# Patient Record
Sex: Female | Born: 1965
Health system: Southern US, Community
[De-identification: ages and names within clinical notes are randomized; demographics above are authoritative.]

## PROBLEM LIST (undated history)

## (undated) DIAGNOSIS — R112 Nausea with vomiting, unspecified: Secondary | ICD-10-CM

## (undated) DIAGNOSIS — Z9889 Other specified postprocedural states: Secondary | ICD-10-CM

## (undated) DIAGNOSIS — F4010 Social phobia, unspecified: Secondary | ICD-10-CM

## (undated) DIAGNOSIS — Z973 Presence of spectacles and contact lenses: Secondary | ICD-10-CM

## (undated) DIAGNOSIS — E785 Hyperlipidemia, unspecified: Secondary | ICD-10-CM

## (undated) DIAGNOSIS — E739 Lactose intolerance, unspecified: Secondary | ICD-10-CM

## (undated) DIAGNOSIS — R768 Other specified abnormal immunological findings in serum: Secondary | ICD-10-CM

## (undated) DIAGNOSIS — D509 Iron deficiency anemia, unspecified: Secondary | ICD-10-CM

## (undated) DIAGNOSIS — M359 Systemic involvement of connective tissue, unspecified: Secondary | ICD-10-CM

## (undated) DIAGNOSIS — N840 Polyp of corpus uteri: Secondary | ICD-10-CM

## (undated) DIAGNOSIS — K589 Irritable bowel syndrome without diarrhea: Secondary | ICD-10-CM

## (undated) DIAGNOSIS — M2550 Pain in unspecified joint: Secondary | ICD-10-CM

## (undated) HISTORY — DX: Hyperlipidemia, unspecified: E78.5

## (undated) HISTORY — DX: Irritable bowel syndrome, unspecified: K58.9

---
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
## 2004-04-27 ENCOUNTER — Encounter: Admission: RE | Admit: 2004-04-27 | Discharge: 2004-04-27 | Payer: Self-pay | Admitting: Internal Medicine

## 2004-04-29 ENCOUNTER — Ambulatory Visit (HOSPITAL_COMMUNITY): Admission: RE | Admit: 2004-04-29 | Discharge: 2004-04-29 | Payer: Self-pay | Admitting: Internal Medicine

## 2004-06-01 ENCOUNTER — Ambulatory Visit (HOSPITAL_COMMUNITY): Admission: RE | Admit: 2004-06-01 | Discharge: 2004-06-01 | Payer: Self-pay | Admitting: Specialist

## 2004-06-17 ENCOUNTER — Ambulatory Visit (HOSPITAL_COMMUNITY): Admission: RE | Admit: 2004-06-17 | Discharge: 2004-06-17 | Payer: Self-pay | Admitting: Optometry

## 2004-08-02 ENCOUNTER — Encounter (INDEPENDENT_AMBULATORY_CARE_PROVIDER_SITE_OTHER): Payer: Self-pay | Admitting: Specialist

## 2004-08-02 ENCOUNTER — Inpatient Hospital Stay (HOSPITAL_COMMUNITY): Admission: RE | Admit: 2004-08-02 | Discharge: 2004-08-04 | Payer: Self-pay | Admitting: Otolaryngology

## 2004-08-02 HISTORY — PX: OTHER SURGICAL HISTORY: SHX169

## 2005-01-10 ENCOUNTER — Ambulatory Visit (HOSPITAL_COMMUNITY): Admission: RE | Admit: 2005-01-10 | Discharge: 2005-01-10 | Payer: Self-pay | Admitting: Internal Medicine

## 2005-07-17 ENCOUNTER — Ambulatory Visit (HOSPITAL_COMMUNITY): Admission: RE | Admit: 2005-07-17 | Discharge: 2005-07-17 | Payer: Self-pay | Admitting: Specialist

## 2005-10-02 ENCOUNTER — Ambulatory Visit (HOSPITAL_COMMUNITY): Admission: RE | Admit: 2005-10-02 | Discharge: 2005-10-02 | Payer: Self-pay | Admitting: Colon and Rectal Surgery

## 2006-04-04 ENCOUNTER — Ambulatory Visit: Payer: Self-pay | Admitting: Family Medicine

## 2006-04-09 ENCOUNTER — Ambulatory Visit: Payer: Self-pay | Admitting: Family Medicine

## 2006-04-23 ENCOUNTER — Ambulatory Visit: Payer: Self-pay | Admitting: Family Medicine

## 2006-04-30 ENCOUNTER — Ambulatory Visit: Payer: Self-pay | Admitting: Gastroenterology

## 2006-05-07 ENCOUNTER — Ambulatory Visit: Payer: Self-pay | Admitting: *Deleted

## 2006-05-28 ENCOUNTER — Ambulatory Visit: Payer: Self-pay | Admitting: Gastroenterology

## 2006-06-20 ENCOUNTER — Ambulatory Visit: Payer: Self-pay | Admitting: Internal Medicine

## 2006-07-09 ENCOUNTER — Ambulatory Visit (HOSPITAL_COMMUNITY): Admission: RE | Admit: 2006-07-09 | Discharge: 2006-07-09 | Payer: Self-pay | Admitting: Gastroenterology

## 2006-07-18 ENCOUNTER — Ambulatory Visit (HOSPITAL_COMMUNITY): Admission: RE | Admit: 2006-07-18 | Discharge: 2006-07-18 | Payer: Self-pay | Admitting: Specialist

## 2006-08-22 ENCOUNTER — Ambulatory Visit: Payer: Self-pay | Admitting: Family Medicine

## 2006-10-15 ENCOUNTER — Ambulatory Visit: Payer: Self-pay | Admitting: Family Medicine

## 2006-10-29 ENCOUNTER — Ambulatory Visit: Payer: Self-pay | Admitting: Family Medicine

## 2006-11-15 ENCOUNTER — Ambulatory Visit: Payer: Self-pay | Admitting: Family Medicine

## 2006-12-31 ENCOUNTER — Emergency Department (HOSPITAL_COMMUNITY): Admission: EM | Admit: 2006-12-31 | Discharge: 2006-12-31 | Payer: Self-pay | Admitting: Emergency Medicine

## 2007-02-11 ENCOUNTER — Ambulatory Visit: Payer: Self-pay | Admitting: Internal Medicine

## 2007-02-13 ENCOUNTER — Ambulatory Visit: Payer: Self-pay | Admitting: Family Medicine

## 2007-02-13 LAB — CONVERTED CEMR LAB
ALT: 10 units/L (ref 0–35)
AST: 12 units/L (ref 0–37)
Albumin: 4.6 g/dL (ref 3.5–5.2)
Alkaline Phosphatase: 39 units/L (ref 39–117)
BUN: 15 mg/dL (ref 6–23)
Bilirubin, Direct: 0.1 mg/dL (ref 0.0–0.3)
CO2: 25 meq/L (ref 19–32)
Calcium: 9.8 mg/dL (ref 8.4–10.5)
Chloride: 103 meq/L (ref 96–112)
Creatinine, Ser: 0.8 mg/dL (ref 0.40–1.20)
Glucose, Bld: 85 mg/dL (ref 70–99)
Helicobacter Pylori Antibody-IgG: 0.4
Indirect Bilirubin: 0.3 mg/dL (ref 0.0–0.9)
Potassium: 3.8 meq/L (ref 3.5–5.3)
Sodium: 141 meq/L (ref 135–145)
Total Bilirubin: 0.4 mg/dL (ref 0.3–1.2)
Total Protein: 7.1 g/dL (ref 6.0–8.3)

## 2007-02-15 ENCOUNTER — Ambulatory Visit: Payer: Self-pay | Admitting: Family Medicine

## 2007-02-26 ENCOUNTER — Ambulatory Visit (HOSPITAL_COMMUNITY): Admission: RE | Admit: 2007-02-26 | Discharge: 2007-02-26 | Payer: Self-pay | Admitting: Family Medicine

## 2007-04-11 DIAGNOSIS — IMO0001 Reserved for inherently not codable concepts without codable children: Secondary | ICD-10-CM

## 2007-04-11 DIAGNOSIS — K589 Irritable bowel syndrome without diarrhea: Secondary | ICD-10-CM

## 2007-04-15 ENCOUNTER — Ambulatory Visit: Payer: Self-pay | Admitting: Internal Medicine

## 2007-08-04 ENCOUNTER — Ambulatory Visit (HOSPITAL_COMMUNITY): Admission: RE | Admit: 2007-08-04 | Discharge: 2007-08-04 | Payer: Self-pay | Admitting: Specialist

## 2007-09-02 ENCOUNTER — Encounter (INDEPENDENT_AMBULATORY_CARE_PROVIDER_SITE_OTHER): Payer: Self-pay | Admitting: *Deleted

## 2007-09-09 ENCOUNTER — Ambulatory Visit: Payer: Self-pay | Admitting: Family Medicine

## 2007-09-09 DIAGNOSIS — R002 Palpitations: Secondary | ICD-10-CM

## 2007-09-09 DIAGNOSIS — Z862 Personal history of diseases of the blood and blood-forming organs and certain disorders involving the immune mechanism: Secondary | ICD-10-CM

## 2007-09-17 ENCOUNTER — Encounter: Payer: Self-pay | Admitting: Family Medicine

## 2007-09-17 ENCOUNTER — Ambulatory Visit: Payer: Self-pay

## 2007-09-18 ENCOUNTER — Ambulatory Visit: Payer: Self-pay | Admitting: Family Medicine

## 2007-09-25 LAB — CONVERTED CEMR LAB
ALT: 16 units/L (ref 0–35)
AST: 16 units/L (ref 0–37)
Albumin: 4.2 g/dL (ref 3.5–5.2)
Alkaline Phosphatase: 38 units/L — ABNORMAL LOW (ref 39–117)
BUN: 13 mg/dL (ref 6–23)
Basophils Absolute: 0 10*3/uL (ref 0.0–0.1)
Basophils Relative: 0.6 % (ref 0.0–1.0)
Bilirubin Urine: NEGATIVE
CO2: 27 meq/L (ref 19–32)
Calcium: 9 mg/dL (ref 8.4–10.5)
Chloride: 102 meq/L (ref 96–112)
Cholesterol: 185 mg/dL (ref 0–200)
Creatinine, Ser: 0.8 mg/dL (ref 0.4–1.2)
Eosinophils Absolute: 0 10*3/uL (ref 0.0–0.6)
Eosinophils Relative: 1.1 % (ref 0.0–5.0)
GFR calc Af Amer: 101 mL/min
GFR calc non Af Amer: 84 mL/min
Glucose, Bld: 97 mg/dL (ref 70–99)
HCT: 38 % (ref 36.0–46.0)
HDL: 57.2 mg/dL (ref >39.0)
Hemoglobin, Urine: NEGATIVE
Hemoglobin: 13.1 g/dL (ref 12.0–15.0)
Ketones, ur: NEGATIVE mg/dL
LDL Cholesterol: 122 mg/dL — ABNORMAL HIGH (ref 0–99)
Leukocytes, UA: NEGATIVE
Lymphocytes Relative: 29.5 % (ref 12.0–46.0)
MCHC: 34.4 g/dL (ref 30.0–36.0)
MCV: 84.5 fL (ref 78.0–100.0)
Monocytes Absolute: 0.4 10*3/uL (ref 0.2–0.7)
Monocytes Relative: 9.6 % (ref 3.0–11.0)
Neutro Abs: 2.3 10*3/uL (ref 1.4–7.7)
Neutrophils Relative %: 59.2 % (ref 43.0–77.0)
Nitrite: NEGATIVE
Platelets: 192 10*3/uL (ref 150–400)
Potassium: 4.1 meq/L (ref 3.5–5.1)
RBC: 4.5 M/uL (ref 3.87–5.11)
RDW: 12.5 % (ref 11.5–14.6)
Sodium: 135 meq/L (ref 135–145)
Specific Gravity, Urine: 1.02 (ref 1.000–1.03)
TSH: 2.21 microintl units/mL (ref 0.35–5.50)
Total Bilirubin: 0.8 mg/dL (ref 0.3–1.2)
Total CHOL/HDL Ratio: 3.2
Total Protein, Urine: NEGATIVE mg/dL
Total Protein: 7.2 g/dL (ref 6.0–8.3)
Triglycerides: 27 mg/dL (ref 0–149)
Urine Glucose: NEGATIVE mg/dL
Urobilinogen, UA: 0.2 (ref 0.0–1.0)
VLDL: 5 mg/dL (ref 0–40)
WBC: 3.9 10*3/uL — ABNORMAL LOW (ref 4.5–10.5)
pH: 6.5 (ref 5.0–8.0)

## 2007-09-26 LAB — CONVERTED CEMR LAB: Vit D, 1,25-Dihydroxy: 31 (ref 30–89)

## 2007-10-02 ENCOUNTER — Telehealth: Payer: Self-pay | Admitting: Family Medicine

## 2007-10-02 DIAGNOSIS — E785 Hyperlipidemia, unspecified: Secondary | ICD-10-CM

## 2007-11-03 ENCOUNTER — Encounter: Payer: Self-pay | Admitting: Family Medicine

## 2007-12-19 ENCOUNTER — Encounter: Payer: Self-pay | Admitting: Internal Medicine

## 2007-12-29 ENCOUNTER — Ambulatory Visit: Payer: Self-pay | Admitting: Family Medicine

## 2007-12-29 DIAGNOSIS — M810 Age-related osteoporosis without current pathological fracture: Secondary | ICD-10-CM | POA: Insufficient documentation

## 2007-12-30 LAB — CONVERTED CEMR LAB: Vit D, 1,25-Dihydroxy: 35 (ref 30–89)

## 2008-01-01 ENCOUNTER — Emergency Department (HOSPITAL_COMMUNITY): Admission: EM | Admit: 2008-01-01 | Discharge: 2008-01-01 | Payer: Self-pay | Admitting: Emergency Medicine

## 2008-01-08 ENCOUNTER — Encounter (INDEPENDENT_AMBULATORY_CARE_PROVIDER_SITE_OTHER): Payer: Self-pay | Admitting: Family Medicine

## 2008-01-15 ENCOUNTER — Encounter: Admission: RE | Admit: 2008-01-15 | Discharge: 2008-01-15 | Payer: Self-pay | Admitting: Family Medicine

## 2008-01-20 ENCOUNTER — Emergency Department (HOSPITAL_COMMUNITY): Admission: EM | Admit: 2008-01-20 | Discharge: 2008-01-20 | Payer: Self-pay | Admitting: Family Medicine

## 2008-03-26 ENCOUNTER — Ambulatory Visit: Payer: Self-pay | Admitting: Licensed Clinical Social Worker

## 2008-03-30 ENCOUNTER — Emergency Department (HOSPITAL_COMMUNITY): Admission: EM | Admit: 2008-03-30 | Discharge: 2008-03-30 | Payer: Self-pay | Admitting: Emergency Medicine

## 2008-04-28 ENCOUNTER — Encounter: Payer: Self-pay | Admitting: Family Medicine

## 2008-06-02 ENCOUNTER — Emergency Department (HOSPITAL_COMMUNITY): Admission: EM | Admit: 2008-06-02 | Discharge: 2008-06-02 | Payer: Self-pay | Admitting: Emergency Medicine

## 2008-06-23 ENCOUNTER — Ambulatory Visit: Payer: Self-pay | Admitting: Family Medicine

## 2008-06-23 DIAGNOSIS — F411 Generalized anxiety disorder: Secondary | ICD-10-CM | POA: Insufficient documentation

## 2008-09-03 ENCOUNTER — Ambulatory Visit (HOSPITAL_COMMUNITY): Admission: RE | Admit: 2008-09-03 | Discharge: 2008-09-03 | Payer: Self-pay | Admitting: Specialist

## 2008-10-28 ENCOUNTER — Ambulatory Visit: Payer: Self-pay | Admitting: Family Medicine

## 2008-10-28 ENCOUNTER — Ambulatory Visit: Payer: Self-pay

## 2008-10-28 DIAGNOSIS — M79609 Pain in unspecified limb: Secondary | ICD-10-CM

## 2008-11-03 ENCOUNTER — Telehealth (INDEPENDENT_AMBULATORY_CARE_PROVIDER_SITE_OTHER): Payer: Self-pay | Admitting: *Deleted

## 2008-11-09 ENCOUNTER — Encounter (INDEPENDENT_AMBULATORY_CARE_PROVIDER_SITE_OTHER): Payer: Self-pay | Admitting: *Deleted

## 2008-12-15 ENCOUNTER — Ambulatory Visit: Payer: Self-pay | Admitting: Family Medicine

## 2008-12-15 DIAGNOSIS — E7210 Disorders of sulfur-bearing amino-acid metabolism, unspecified: Secondary | ICD-10-CM | POA: Insufficient documentation

## 2008-12-16 ENCOUNTER — Ambulatory Visit: Payer: Self-pay | Admitting: Family Medicine

## 2008-12-19 LAB — CONVERTED CEMR LAB
Homocysteine: 7.2 micromoles/L (ref 4.0–15.4)
Vit D, 1,25-Dihydroxy: 32 (ref 30–89)

## 2008-12-20 ENCOUNTER — Telehealth (INDEPENDENT_AMBULATORY_CARE_PROVIDER_SITE_OTHER): Payer: Self-pay | Admitting: *Deleted

## 2008-12-21 ENCOUNTER — Encounter (INDEPENDENT_AMBULATORY_CARE_PROVIDER_SITE_OTHER): Payer: Self-pay | Admitting: *Deleted

## 2008-12-30 LAB — CONVERTED CEMR LAB
ALT: 14 units/L (ref 0–35)
AST: 14 units/L (ref 0–37)
Albumin: 4.3 g/dL (ref 3.5–5.2)
Alkaline Phosphatase: 35 units/L — ABNORMAL LOW (ref 39–117)
BUN: 9 mg/dL (ref 6–23)
Basophils Absolute: 0 10*3/uL (ref 0.0–0.1)
Basophils Relative: 0.4 % (ref 0.0–3.0)
Bilirubin, Direct: 0.1 mg/dL (ref 0.0–0.3)
CO2: 29 meq/L (ref 19–32)
Calcium: 9.5 mg/dL (ref 8.4–10.5)
Chloride: 105 meq/L (ref 96–112)
Cholesterol: 169 mg/dL (ref 0–200)
Creatinine, Ser: 0.8 mg/dL (ref 0.4–1.2)
Eosinophils Absolute: 0.1 10*3/uL (ref 0.0–0.7)
Eosinophils Relative: 1 % (ref 0.0–5.0)
Folate: >20 ng/mL
GFR calc Af Amer: 101 mL/min
GFR calc non Af Amer: 83 mL/min
Glucose, Bld: 87 mg/dL (ref 70–99)
HCT: 40.2 % (ref 36.0–46.0)
HDL: 52 mg/dL (ref >39.0)
Hemoglobin: 13.6 g/dL (ref 12.0–15.0)
LDL Cholesterol: 108 mg/dL — ABNORMAL HIGH (ref 0–99)
Lymphocytes Relative: 23.2 % (ref 12.0–46.0)
MCHC: 33.9 g/dL (ref 30.0–36.0)
MCV: 85.4 fL (ref 78.0–100.0)
Monocytes Absolute: 0.5 10*3/uL (ref 0.1–1.0)
Monocytes Relative: 9.9 % (ref 3.0–12.0)
Neutro Abs: 3.5 10*3/uL (ref 1.4–7.7)
Neutrophils Relative %: 65.5 % (ref 43.0–77.0)
Platelets: 177 10*3/uL (ref 150–400)
Potassium: 4.1 meq/L (ref 3.5–5.1)
RBC: 4.71 M/uL (ref 3.87–5.11)
RDW: 12.7 % (ref 11.5–14.6)
Sodium: 141 meq/L (ref 135–145)
TSH: 1.71 microintl units/mL (ref 0.35–5.50)
Total Bilirubin: 1.1 mg/dL (ref 0.3–1.2)
Total CHOL/HDL Ratio: 3.3
Total Protein: 7 g/dL (ref 6.0–8.3)
Triglycerides: 44 mg/dL (ref 0–149)
VLDL: 9 mg/dL (ref 0–40)
Vitamin B-12: 387 pg/mL (ref 211–911)
WBC: 5.4 10*3/uL (ref 4.5–10.5)

## 2009-01-03 ENCOUNTER — Ambulatory Visit: Payer: Self-pay | Admitting: Internal Medicine

## 2009-01-03 ENCOUNTER — Encounter (INDEPENDENT_AMBULATORY_CARE_PROVIDER_SITE_OTHER): Payer: Self-pay | Admitting: *Deleted

## 2009-01-05 ENCOUNTER — Ambulatory Visit: Payer: Self-pay | Admitting: Internal Medicine

## 2009-02-02 ENCOUNTER — Ambulatory Visit: Payer: Self-pay | Admitting: Family Medicine

## 2009-02-02 DIAGNOSIS — E559 Vitamin D deficiency, unspecified: Secondary | ICD-10-CM | POA: Insufficient documentation

## 2009-02-02 DIAGNOSIS — J020 Streptococcal pharyngitis: Secondary | ICD-10-CM | POA: Insufficient documentation

## 2009-02-04 ENCOUNTER — Encounter (INDEPENDENT_AMBULATORY_CARE_PROVIDER_SITE_OTHER): Payer: Self-pay | Admitting: *Deleted

## 2009-02-04 LAB — CONVERTED CEMR LAB: Vit D, 25-Hydroxy: 56 ng/mL (ref 30–89)

## 2009-02-18 ENCOUNTER — Ambulatory Visit: Payer: Self-pay | Admitting: Internal Medicine

## 2009-04-07 ENCOUNTER — Telehealth (INDEPENDENT_AMBULATORY_CARE_PROVIDER_SITE_OTHER): Payer: Self-pay | Admitting: *Deleted

## 2009-05-31 ENCOUNTER — Encounter: Payer: Self-pay | Admitting: Family Medicine

## 2009-06-01 ENCOUNTER — Encounter: Payer: Self-pay | Admitting: Family Medicine

## 2009-06-08 ENCOUNTER — Encounter (INDEPENDENT_AMBULATORY_CARE_PROVIDER_SITE_OTHER): Payer: Self-pay | Admitting: *Deleted

## 2009-06-09 ENCOUNTER — Encounter: Payer: Self-pay | Admitting: Family Medicine

## 2009-09-05 ENCOUNTER — Ambulatory Visit (HOSPITAL_COMMUNITY): Admission: RE | Admit: 2009-09-05 | Discharge: 2009-09-05 | Payer: Self-pay | Admitting: Obstetrics and Gynecology

## 2009-10-27 ENCOUNTER — Ambulatory Visit: Payer: Self-pay | Admitting: Family Medicine

## 2009-12-05 ENCOUNTER — Ambulatory Visit: Payer: Self-pay | Admitting: Genetic Counselor

## 2009-12-07 ENCOUNTER — Encounter: Payer: Self-pay | Admitting: Family Medicine

## 2010-01-13 ENCOUNTER — Ambulatory Visit: Payer: Self-pay | Admitting: Family Medicine

## 2010-01-17 ENCOUNTER — Encounter: Payer: Self-pay | Admitting: Family Medicine

## 2010-01-18 LAB — CONVERTED CEMR LAB: Vit D, 25-Hydroxy: 34 ng/mL (ref 30–89)

## 2010-05-11 ENCOUNTER — Emergency Department (HOSPITAL_COMMUNITY): Admission: EM | Admit: 2010-05-11 | Discharge: 2010-05-11 | Payer: Self-pay | Admitting: Family Medicine

## 2010-10-11 ENCOUNTER — Encounter: Payer: Self-pay | Admitting: Family Medicine

## 2010-10-11 LAB — CONVERTED CEMR LAB: Pap Smear: NORMAL

## 2010-10-30 ENCOUNTER — Ambulatory Visit (HOSPITAL_COMMUNITY): Admission: RE | Admit: 2010-10-30 | Discharge: 2010-10-30 | Payer: Self-pay | Admitting: Obstetrics and Gynecology

## 2010-11-24 ENCOUNTER — Ambulatory Visit (HOSPITAL_COMMUNITY)
Admission: RE | Admit: 2010-11-24 | Discharge: 2010-11-24 | Payer: Self-pay | Source: Home / Self Care | Attending: Obstetrics and Gynecology | Admitting: Obstetrics and Gynecology

## 2010-11-24 ENCOUNTER — Encounter: Payer: Self-pay | Admitting: Family Medicine

## 2010-12-06 ENCOUNTER — Encounter: Payer: Self-pay | Admitting: Family Medicine

## 2010-12-16 ENCOUNTER — Encounter (HOSPITAL_COMMUNITY): Payer: Self-pay | Admitting: Obstetrics and Gynecology

## 2010-12-16 ENCOUNTER — Encounter: Payer: Self-pay | Admitting: Otolaryngology

## 2010-12-16 ENCOUNTER — Encounter: Payer: Self-pay | Admitting: Rheumatology

## 2010-12-17 ENCOUNTER — Encounter: Payer: Self-pay | Admitting: Gastroenterology

## 2010-12-17 ENCOUNTER — Encounter (HOSPITAL_COMMUNITY): Payer: Self-pay | Admitting: Obstetrics and Gynecology

## 2010-12-17 ENCOUNTER — Encounter: Payer: Self-pay | Admitting: Internal Medicine

## 2010-12-24 LAB — CONVERTED CEMR LAB
ALT: 21 units/L (ref 0–35)
AST: 21 units/L (ref 0–37)
Basophils Relative: 0.5 % (ref 0.0–3.0)
Bilirubin, Direct: 0.1 mg/dL (ref 0.0–0.3)
Chloride: 107 meq/L (ref 96–112)
Eosinophils Relative: 1.1 % (ref 0.0–5.0)
HCT: 38 % (ref 36.0–46.0)
LDL Cholesterol: 129 mg/dL — ABNORMAL HIGH (ref 0–99)
Lymphs Abs: 1.1 10*3/uL (ref 0.7–4.0)
MCV: 87 fL (ref 78.0–100.0)
Monocytes Absolute: 0.3 10*3/uL (ref 0.1–1.0)
Pap Smear: NORMAL
Pap Smear: NORMAL
Potassium: 4.3 meq/L (ref 3.5–5.1)
RBC: 4.37 M/uL (ref 3.87–5.11)
Sodium: 142 meq/L (ref 135–145)
TSH: 2.18 microintl units/mL (ref 0.35–5.50)
Total CHOL/HDL Ratio: 3
Total Protein: 7.3 g/dL (ref 6.0–8.3)
WBC: 3.8 10*3/uL — ABNORMAL LOW (ref 4.5–10.5)

## 2010-12-26 NOTE — Letter (Signed)
Summary: [facility]  [facility]   Imported By: Lanelle Bal 12/19/2009 09:10:32  _____________________________________________________________________  External Attachment:    Type:   Image     Comment:   External Document

## 2010-12-26 NOTE — Assessment & Plan Note (Signed)
Summary: CPX/NS/KDC   Vital Signs:  Patient profile:   45 year old female Height:      67 inches Weight:      148 pounds BMI:     23.26 Temp:     98.2 degrees F oral Pulse rate:   82 / minute Pulse rhythm:   regular BP sitting:   116 / 72  (left arm) Cuff size:   regular  Vitals Entered By: Army Fossa CMA (January 13, 2010 8:35 AM) CC: CPX, no pap.    History of Present Illness: Pt here for cpe and labs.  Pt saw cardiology for palpatations and they found nothing ---see will f/u with them if it happens again.   Pt saw Dr Corliss Skains for f/u---she is off plaquenil and will f/u annually or as needed.  Pt c/o knee pain . She felt it was OA.    Preventive Screening-Counseling & Management  Alcohol-Tobacco     Alcohol drinks/day: 0     Smoking Status: never     Passive Smoke Exposure: no  Caffeine-Diet-Exercise     Caffeine use/day: 1     Does Patient Exercise: no  Hep-HIV-STD-Contraception     HIV Risk: no     Dental Visit-last 6 months yes     Dental Care Counseling: not indicated; dental care within six months  Safety-Violence-Falls     Seat Belt Use: 100      Sexual History:  currently monogamous.    Current Medications (verified): 1)  Mvi .Marland Kitchen.. 1 By Mouth Once Daily 2)  Vitamin D 65784 Unit Caps (Ergocalciferol) .... Take 1 Tab Weekly 3)  Xanax 0.25 Mg Tabs (Alprazolam) .Marland Kitchen.. 1-2 A Month On Average.  Allergies: 1)  ! Sulfa  Past History:  Past Medical History: Last updated: 02/16/2009 IBS fibromyalgia Anxiety tachycardia  Past Surgical History: Last updated: 12/15/2008 Appendectomy (1981) brachial cyst removed 2005 husband had vasectomy  Family History: Last updated: 01/13/2010 Family History Diabetes 1st degree relative Family History Hypertension Family History of Alcoholism/Addiction Family History High cholesterol Family History of Arthritis PGM-breast Ca Family History of Stroke M 1st degree relative in 70s muscular  dystropy---PGF Family History Thyroid disease Family History of Melanoma-- MGF Family History Breast cancer 1st degree relative 63 yo--mother  Social History: Last updated: 09/09/2007 Occupation:nurse  Married Never Smoked Alcohol use-no Drug use-no Regular exercise-no  Risk Factors: Alcohol Use: 0 (01/13/2010) Caffeine Use: 1 (01/13/2010) Exercise: no (01/13/2010)  Risk Factors: Smoking Status: never (01/13/2010) Passive Smoke Exposure: no (01/13/2010)  Family History: Reviewed history from 09/09/2007 and no changes required. Family History Diabetes 1st degree relative Family History Hypertension Family History of Alcoholism/Addiction Family History High cholesterol Family History of Arthritis PGM-breast Ca Family History of Stroke M 1st degree relative in 70s muscular dystropy---PGF Family History Thyroid disease Family History of Melanoma-- MGF Family History Breast cancer 1st degree relative 28 yo--mother  Social History: Reviewed history from 09/09/2007 and no changes required. 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 Eyes:  Denies blurring, discharge, double vision, eye irritation, eye pain, halos, itching, light sensitivity, red eye, vision loss-1 eye, and vision loss-both eyes; optho q1y. ENT:  Denies decreased hearing, difficulty swallowing, ear discharge, earache, hoarseness, nasal congestion, nosebleeds,  postnasal drainage, ringing in ears, sinus pressure, and sore throat. CV:  Complains of palpitations; denies bluish discoloration of lips or nails, chest pain or discomfort, difficulty breathing at night, difficulty breathing while lying down, fainting, fatigue, leg  cramps with exertion, lightheadness, near fainting, shortness of breath with exertion, swelling of feet, swelling of hands, and weight gain. Resp:  Denies chest discomfort, chest pain with inspiration, cough, coughing up blood, excessive snoring, hypersomnolence, morning headaches, pleuritic, shortness of breath, sputum productive, and wheezing. GI:  Denies abdominal pain, bloody stools, change in bowel habits, constipation, dark tarry stools, diarrhea, excessive appetite, gas, hemorrhoids, indigestion, loss of appetite, nausea, vomiting, vomiting blood, and yellowish skin color. GU:  Denies abnormal vaginal bleeding, decreased libido, discharge, dysuria, genital sores, hematuria, incontinence, nocturia, urinary frequency, and urinary hesitancy. MS:  Complains of joint pain; denies joint redness, joint swelling, loss of strength, low back pain, mid back pain, muscle aches, muscle , cramps, muscle weakness, stiffness, and thoracic pain. Derm:  Denies changes in color of skin, changes in nail beds, dryness, excessive perspiration, flushing, hair loss, insect bite(s), itching, lesion(s), poor wound healing, and rash; derm -- Dr Danella Deis. Neuro:  Denies brief paralysis, difficulty with concentration, disturbances in coordination, falling down, headaches, inability to speak, memory loss, numbness, poor balance, seizures, sensation of room spinning, tingling, tremors, visual disturbances, and weakness. Psych:  Denies alternate hallucination ( auditory/visual), anxiety, depression, easily angered, easily tearful, irritability, mental problems, panic attacks, sense of great danger, suicidal thoughts/plans, thoughts of violence, unusual visions or sounds, and thoughts /plans of harming others. Endo:  Denies cold intolerance, excessive hunger, excessive thirst, excessive urination, heat intolerance, polyuria, and weight change. Heme:  Denies abnormal bruising, bleeding, enlarge lymph nodes, fevers, pallor, and skin  discoloration.  Physical Exam  General:  Well-developed,well-nourished,in no acute distress; alert,appropriate and cooperative throughout examination Head:  Normocephalic and atraumatic without obvious abnormalities. No apparent alopecia or balding. Eyes:  vision grossly intact, pupils equal, pupils round, pupils reactive to light, pupils react to accomodation, and no injection.   Ears:  External ear exam shows no significant lesions or deformities.  Otoscopic examination reveals clear canals, tympanic membranes are intact bilaterally without bulging, retraction, inflammation or discharge. Hearing is grossly normal bilaterally. Nose:  External nasal examination shows no deformity or inflammation. Nasal mucosa are pink and moist without lesions or exudates. Mouth:  Oral mucosa and oropharynx without lesions or exudates.  Teeth in good repair. Neck:  No deformities, masses, or tenderness noted. Breasts:  gyn Lungs:  Normal respiratory effort, chest expands symmetrically. Lungs are clear to auscultation, no crackles or wheezes. Heart:  normal rate and no murmur.   Abdomen:  Bowel sounds positive,abdomen soft and non-tender without masses, organomegaly or hernias noted. Rectal:  gyn Genitalia:  gyn Msk:  normal ROM, no joint tenderness, no joint swelling, no joint warmth, no redness over joints, no joint deformities, no joint instability, and no crepitation.   Pulses:  R posterior tibial normal, R dorsalis pedis normal, R carotid normal, L posterior tibial normal, L dorsalis pedis normal, and L carotid normal.   Extremities:  No clubbing, cyanosis, edema, or deformity noted with normal full range of motion of all joints.   Neurologic:  No cranial nerve deficits noted. Station and gait are normal. Plantar reflexes are down-going bilaterally. DTRs are symmetrical throughout. Sensory, motor and coordinative functions appear intact. Skin:  Intact without suspicious lesions or rashes Cervical Nodes:  No  lymphadenopathy noted Axillary Nodes:  No palpable lymphadenopathy  Psych:  Cognition and judgment appear intact. Alert and cooperative with normal attention span and concentration. No apparent delusions, illusions, hallucinations   Impression & Recommendations:  Problem # 1:  PREVENTIVE HEALTH CARE (ICD-V70.0)  Orders: Venipuncture (04540) TLB-Lipid Panel (80061-LIPID) TLB-BMP (Basic Metabolic Panel-BMET) (80048-METABOL) TLB-CBC Platelet - w/Differential (85025-CBCD) TLB-Hepatic/Liver Function Pnl (80076-HEPATIC) TLB-TSH (Thyroid Stimulating Hormone) (84443-TSH) T-Vitamin D (25-Hydroxy) (98119-14782) EKG w/ Interpretation (93000)  Problem # 2:  UNSPECIFIED VITAMIN D DEFICIENCY (ICD-268.9)  Orders: Venipuncture (95621) TLB-Lipid Panel (80061-LIPID) TLB-BMP (Basic Metabolic Panel-BMET) (80048-METABOL) TLB-CBC Platelet - w/Differential (85025-CBCD) TLB-Hepatic/Liver Function Pnl (80076-HEPATIC) TLB-TSH (Thyroid Stimulating Hormone) (84443-TSH) T-Vitamin D (25-Hydroxy) (30865-78469)  Problem # 3:  UNSPECIFIED OSTEOPOROSIS (ICD-733.00)  Her updated medication list for this problem includes:    Vitamin D 62952 Unit Caps (Ergocalciferol) .Marland Kitchen... Take 1 tab weekly  Problem # 4:  HYPERLIPIDEMIA (ICD-272.4)  Orders: Venipuncture (84132) TLB-Lipid Panel (80061-LIPID) TLB-BMP (Basic Metabolic Panel-BMET) (80048-METABOL) TLB-CBC Platelet - w/Differential (85025-CBCD) TLB-Hepatic/Liver Function Pnl (80076-HEPATIC) TLB-TSH (Thyroid Stimulating Hormone) (84443-TSH) T-Vitamin D (25-Hydroxy) (44010-27253)  Complete Medication List: 1)  Mvi  .Marland Kitchen.. 1 by mouth once daily 2)  Vitamin D 66440 Unit Caps (Ergocalciferol) .... Take 1 tab weekly 3)  Xanax 0.25 Mg Tabs (Alprazolam) .Marland Kitchen.. 1-2 a month on average.   EKG  Procedure date:  01/13/2010  Findings:      Normal sinus rhythm with rate of:  79 bpm    Last Flu Vaccine:  Historical (11/26/2004 11:39:22 AM) Flu Vaccine Result Date:   09/15/2008 Flu Vaccine Result:  given Flu Vaccine Next Due:  1 yr Last PAP:  Normal (09/01/2008 9:10:05 AM) PAP Result Date:  10/05/2009 PAP Result:  normal PAP Next Due:  1 yr Last Mammogram:  Normal Bilateral (09/08/2008 9:09:35 AM) Mammogram Result Date:  10/05/2009 Mammogram Result:  normal Mammogram Next Due:  1 yr

## 2010-12-28 NOTE — Letter (Signed)
Summary: [facility]  [facility]   Imported By: Lanelle Bal 12/19/2010 12:35:56  _____________________________________________________________________  External Attachment:    Type:   Image     Comment:   External Document

## 2011-01-03 ENCOUNTER — Ambulatory Visit (INDEPENDENT_AMBULATORY_CARE_PROVIDER_SITE_OTHER): Payer: Commercial Managed Care - PPO | Admitting: Family Medicine

## 2011-01-03 ENCOUNTER — Encounter: Payer: Self-pay | Admitting: Family Medicine

## 2011-01-03 DIAGNOSIS — J029 Acute pharyngitis, unspecified: Secondary | ICD-10-CM | POA: Insufficient documentation

## 2011-01-05 ENCOUNTER — Telehealth: Payer: Self-pay | Admitting: Family Medicine

## 2011-01-11 NOTE — Progress Notes (Signed)
Summary: still no better  Phone Note Call from Patient Call back at Home Phone [phone]   Caller: Patient Summary of Call: Pt called to report that she is still no better. Pt c/o pain in left cheek and sore throat. Pt feels that it may be a sinus infection because her son was just Dx with one. Pt is requesting Rx. [facility] [address] Pls advise....Marland KitchenMarland KitchenFelecia Deloach CMA  January 05, 2011 12:47 PM   Follow-up for Phone Call        ceftin 500 mg 1 by mouth two times a day for 10 days  Follow-up by: Loreen Freud DO,  January 05, 2011 1:06 PM  Additional Follow-up for Phone Call Additional follow up Details #1::        I spoke w/ pt she is aware. Army Fossa CMA  January 05, 2011 2:02 PM     New/Updated Medications: CEFTIN 500 MG TABS (CEFUROXIME AXETIL) 1 by mouth two times a day for 10 days. Prescriptions: CEFTIN 500 MG TABS (CEFUROXIME AXETIL) 1 by mouth two times a day for 10 days.  #20 x 0   Entered by:   Army Fossa CMA   Authorized by:   Loreen Freud DO   Signed by:   Army Fossa CMA on 01/05/2011   Method used:   [facility]ronically to        [facility] [phone]* (retail)       [address]       [place], [place]  [address]       Ph: [phone]       Fax: [phone]   RxID:   [id number]

## 2011-01-11 NOTE — Assessment & Plan Note (Signed)
Summary: sore throat/cbs   Vital Signs:  Patient profile:   45 year old female Height:      67 inches Weight:      149.2 pounds BMI:     23.45 Temp:     98.1 degrees F oral BP sitting:   112 / 72  (right arm) Cuff size:   regular  Vitals Entered By: Almeta Monas CMA Duncan Dull) (January 03, 2011 11:28 AM) CC: c/o sore throat x 1day--daughter has strep, URI symptoms   History of Present Illness:       This is a 45 year old woman who presents with URI symptoms.  The symptoms began 2 days ago.  Pt here c/o ST ---her daugher was dx with strep.  The patient complains of sore throat, but denies nasal congestion, clear nasal discharge, purulent nasal discharge, dry cough, productive cough, earache, and sick contacts.  The patient denies fever, low-grade fever (<100.5 degrees), fever of 100.5-103 degrees, fever of 103.1-104 degrees, fever to >104 degrees, stiff neck, dyspnea, wheezing, rash, vomiting, diarrhea, use of an antipyretic, and response to antipyretic.  The patient denies itchy watery eyes, itchy throat, sneezing, seasonal symptoms, response to antihistamine, headache, muscle aches, and severe fatigue.  The patient denies the following risk factors for Strep sinusitis: unilateral facial pain, unilateral nasal discharge, poor response to decongestant, double sickening, tooth pain tender adenopathy, and absence of cough.    Current Medications (verified): 1)  Mvi .Marland Kitchen.. 1 By Mouth Once Daily 2)  Xanax 0.25 Mg Tabs (Alprazolam) .Marland Kitchen.. 1-2 A Month On Average.  Allergies (verified): 1)  ! Sulfa  Past History:  Past medical, surgical, family and social histories (including risk factors) reviewed for relevance to current acute and chronic problems.  Past Medical History: Reviewed history from 02/16/2009 and no changes required. IBS fibromyalgia Anxiety tachycardia  Past Surgical History: Reviewed history from 12/15/2008 and no changes required. Appendectomy (1981) brachial cyst removed  2005 husband had vasectomy  Family History: Reviewed history from 01/13/2010 and no changes required. Family History Diabetes 1st degree relative Family History Hypertension Family History of Alcoholism/Addiction Family History High cholesterol Family History of Arthritis PGM-breast Ca Family History of Stroke M 1st degree relative in 70s muscular dystropy---PGF Family History Thyroid disease Family History of Melanoma-- MGF Family History Breast cancer 1st degree relative 38 yo--mother  Social History: Reviewed history from 09/09/2007 and no changes required. 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  Otoscopic examination reveals clear canals, tympanic membranes are intact bilaterally without bulging, retraction, inflammation or discharge. Hearing is grossly normal bilaterally. Nose:  External nasal examination shows no deformity or inflammation. Nasal mucosa are pink and moist without lesions or exudates. Mouth:  no exudates and pharyngeal erythema.   Neck:  No deformities, masses, or tenderness noted. Lungs:  Normal respiratory effort, chest expands symmetrically. Lungs are clear to auscultation, no crackles or wheezes. Heart:  Normal rate and regular rhythm. S1 and S2 normal without gallop, murmur, click, rub or other extra sounds. Psych:  Oriented X3 and not anxious appearing.     Impression & Recommendations:  Problem # 1:  SORE THROAT (ICD-462)  Orders: Rapid Strep (78469)  Instructed to complete antibiotics and call if not improved in 48 hours.   Complete  Medication List: 1)  Mvi  .Marland Kitchen.. 1 by mouth once daily 2)  Xanax 0.25 Mg Tabs (Alprazolam) .Marland Kitchen.. 1-2 a month on  average.   Orders Added: 1)  Rapid Strep [63875] 2)  Est. Patient Level III [64332]    Laboratory Results    Other Tests  Rapid Strep: negative

## 2011-01-15 ENCOUNTER — Ambulatory Visit (INDEPENDENT_AMBULATORY_CARE_PROVIDER_SITE_OTHER): Payer: Commercial Managed Care - PPO | Admitting: Family Medicine

## 2011-01-15 ENCOUNTER — Encounter: Payer: Self-pay | Admitting: Family Medicine

## 2011-01-15 DIAGNOSIS — E785 Hyperlipidemia, unspecified: Secondary | ICD-10-CM

## 2011-01-15 DIAGNOSIS — Z0000 Encounter for general adult medical examination without abnormal findings: Secondary | ICD-10-CM

## 2011-01-15 DIAGNOSIS — R200 Anesthesia of skin: Secondary | ICD-10-CM

## 2011-01-15 DIAGNOSIS — R209 Unspecified disturbances of skin sensation: Secondary | ICD-10-CM

## 2011-01-15 DIAGNOSIS — E559 Vitamin D deficiency, unspecified: Secondary | ICD-10-CM

## 2011-01-15 LAB — CONVERTED CEMR LAB
Bilirubin Urine: NEGATIVE
Blood in Urine, dipstick: NEGATIVE
Glucose, Urine, Semiquant: NEGATIVE
Ketones, urine, test strip: NEGATIVE
Protein, U semiquant: NEGATIVE
pH: 7

## 2011-01-18 ENCOUNTER — Other Ambulatory Visit: Payer: Self-pay | Admitting: Family Medicine

## 2011-01-18 ENCOUNTER — Other Ambulatory Visit: Payer: Commercial Managed Care - PPO

## 2011-01-18 ENCOUNTER — Encounter: Payer: Self-pay | Admitting: Family Medicine

## 2011-01-18 ENCOUNTER — Encounter (INDEPENDENT_AMBULATORY_CARE_PROVIDER_SITE_OTHER): Payer: Self-pay | Admitting: *Deleted

## 2011-01-18 DIAGNOSIS — Z0000 Encounter for general adult medical examination without abnormal findings: Secondary | ICD-10-CM

## 2011-01-18 LAB — CBC WITH DIFFERENTIAL/PLATELET
Basophils Absolute: 0 10*3/uL (ref 0.0–0.1)
Basophils Relative: 0.5 % (ref 0.0–3.0)
Eosinophils Absolute: 0.1 10*3/uL (ref 0.0–0.7)
Eosinophils Relative: 1.3 % (ref 0.0–5.0)
HCT: 39.1 % (ref 36.0–46.0)
Hemoglobin: 13.3 g/dL (ref 12.0–15.0)
Lymphocytes Relative: 24.4 % (ref 12.0–46.0)
Lymphs Abs: 1.2 10*3/uL (ref 0.7–4.0)
MCHC: 33.9 g/dL (ref 30.0–36.0)
MCV: 85.9 fl (ref 78.0–100.0)
Monocytes Absolute: 0.4 10*3/uL (ref 0.1–1.0)
Monocytes Relative: 9 % (ref 3.0–12.0)
Neutro Abs: 3.1 10*3/uL (ref 1.4–7.7)
Neutrophils Relative %: 64.8 % (ref 43.0–77.0)
Platelets: 210 10*3/uL (ref 150.0–400.0)
RBC: 4.55 Mil/uL (ref 3.87–5.11)
RDW: 13.4 % (ref 11.5–14.6)
WBC: 4.8 10*3/uL (ref 4.5–10.5)

## 2011-01-18 LAB — TSH: TSH: 2.09 u[IU]/mL (ref 0.35–5.50)

## 2011-01-18 LAB — HEPATIC FUNCTION PANEL
ALT: 13 U/L (ref 0–35)
AST: 18 U/L (ref 0–37)
Albumin: 4.1 g/dL (ref 3.5–5.2)
Alkaline Phosphatase: 43 U/L (ref 39–117)
Bilirubin, Direct: 0.1 mg/dL (ref 0.0–0.3)
Total Bilirubin: 0.6 mg/dL (ref 0.3–1.2)
Total Protein: 6.8 g/dL (ref 6.0–8.3)

## 2011-01-18 LAB — BASIC METABOLIC PANEL
BUN: 15 mg/dL (ref 6–23)
CO2: 30 mEq/L (ref 19–32)
Calcium: 9.2 mg/dL (ref 8.4–10.5)
Chloride: 104 mEq/L (ref 96–112)
Creatinine, Ser: 0.7 mg/dL (ref 0.4–1.2)
GFR: 91.24 mL/min (ref >60.00)
Glucose, Bld: 69 mg/dL — ABNORMAL LOW (ref 70–99)
Potassium: 4.9 mEq/L (ref 3.5–5.1)
Sodium: 140 mEq/L (ref 135–145)

## 2011-01-23 NOTE — Assessment & Plan Note (Signed)
Summary: cpx/fasting/kn   Vital Signs:  Patient profile:   45 year old female Height:      67 inches Weight:      148.4 pounds Pulse rate:   78 / minute Pulse rhythm:   regular BP sitting:   108 / 69  (left arm) Cuff size:   regular  Vitals Entered By: Almeta Monas CMA [facility] Dull) (January 15, 2011 8:37 AM) CC: CPX/No pap--Not fasting   History of Present Illness: Pt here for cpe and labs.  Pt will be having MRI and mammogram q1y per gyn.  ANA is + again but Dr Corliss Skains is holding off on plaquenil for now because she has no symptoms.  No other complaints.    Preventive Screening-Counseling & Management  Alcohol-Tobacco     Alcohol drinks/day: 0     Smoking Status: never     Passive Smoke Exposure: no  Caffeine-Diet-Exercise     Caffeine use/day: 2     Does Patient Exercise: yes     Type of exercise: aerobics     Times/week: <3     Exercise Counseling: to improve exercise regimen  Hep-HIV-STD-Contraception     HIV Risk: no     Dental Visit-last 6 months yes     Dental Care Counseling: not indicated; dental care within six months     SBE monthly: yes     SBE Education/Counseling: not indicated; SBE done regularly  Safety-Violence-Falls     Seat Belt Use: 100      Sexual History:  currently monogamous.        Drug Use:  no.    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) 7)  Leg Pain, Left  (ICD-729.5) 8)  Anxiety  (ICD-300.00) 9)  Unspecified Osteoporosis  (ICD-733.00) 10)  Hyperlipidemia  (ICD-272.4) 11)  Preventive Health Care  (ICD-V70.0) 12)  Symptom, Palpitations  (ICD-785.1) 13)  Ana Positive, Hx of  (ICD-V12.3) 14)  Family History of Alcoholism/addiction  (ICD-V61.41) 15)  Injury, 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 1 By Mouth Once Daily 2)  Xanax 0.25 Mg Tabs (Alprazolam) .Marland Kitchen.. 1-2 A Month On Average. 3)  Ceftin 500 Mg Tabs (Cefuroxime Axetil) .Marland Kitchen.. 1 By Mouth Two Times A Day For 10 Days.  Current Medications (verified): 1)  Mvi .Marland Kitchen.. 1 By Mouth Once Daily 2)  Xanax 0.25 Mg Tabs (Alprazolam) .Marland Kitchen.. 1-2 A Month On Average. 3)  Retin-A 0.025 % Crea (Tretinoin) .... Apply At Bedtime  Allergies (verified): 1)  ! Sulfa  Past History:  Past Medical History: Last updated: 02/16/2009 IBS fibromyalgia Anxiety tachycardia  Past Surgical History: Last updated: 12/15/2008 Appendectomy (1981) brachial cyst removed 2005 husband had vasectomy  Family History: Last updated: 01/15/2011 Family History Diabetes 1st degree relative Family History Hypertension Family History of Alcoholism/Addiction Family History High cholesterol Family History of Arthritis PGM-breast Ca Family History of Stroke M 1st degree relative in 70s muscular dystropy---PGF Family History Thyroid disease  Family History of Melanoma-- MGF Family History Breast cancer 1st degree relative 16 yo--mother Family History Uterine cancer  Social History: Last updated: 09/09/2007 Occupation:nurse  Married Never Smoked Alcohol use-no Drug use-no Regular exercise-no  Risk Factors: Alcohol Use: 0 (01/15/2011) Caffeine Use: 2 (01/15/2011) Exercise: yes (01/15/2011)  Risk Factors: Smoking Status: never (01/15/2011) Passive Smoke Exposure: no (01/15/2011)  Family History: Reviewed history from 01/13/2010 and no changes required. Family History Diabetes 1st degree relative Family History  Hypertension Family History of Alcoholism/Addiction Family History High cholesterol Family History of Arthritis PGM-breast Ca Family History of Stroke M 1st degree relative in 70s muscular dystropy---PGF Family History Thyroid disease Family History of Melanoma-- MGF Family History Breast cancer 1st degree relative 23 yo--mother Family History Uterine cancer  Social History: Reviewed history from 09/09/2007 and no changes required. 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 Eyes:  Denies blurring, discharge, double vision, eye irritation, eye pain, halos, itching, light sensitivity, red eye, vision loss-1 eye, and vision loss-both eyes; optho-- q1y . ENT:  Denies decreased hearing, difficulty swallowing, ear discharge, earache, hoarseness, nasal congestion, nosebleeds, postnasal drainage, ringing in ears, sinus pressure, and sore throat. CV:  Denies bluish discoloration of lips or nails, chest pain or discomfort, difficulty breathing at night, difficulty breathing while lying down, fainting, fatigue, leg cramps with exertion, lightheadness, near fainting, palpitations, shortness of breath with exertion, swelling of feet, swelling of hands, and weight gain. Resp:  Denies chest discomfort, chest pain with inspiration, cough, coughing up blood, excessive snoring, hypersomnolence, morning headaches, pleuritic, shortness of breath, sputum productive, and wheezing. GI:  Denies abdominal pain, bloody stools, change in bowel habits, constipation, dark tarry stools, diarrhea, excessive appetite, gas, hemorrhoids, indigestion, loss of appetite, nausea, vomiting, vomiting blood, and yellowish skin color. GU:  Denies abnormal vaginal bleeding, decreased  libido, discharge, dysuria, genital sores, hematuria, incontinence, nocturia, urinary frequency, and urinary hesitancy. MS:  Denies joint pain, joint redness, joint swelling, loss of strength, low back pain, mid back pain, muscle aches, muscle , cramps, muscle weakness, stiffness, and thoracic pain. Derm:  Denies changes in color of skin, changes in nail beds, dryness, excessive perspiration, flushing, hair loss, insect bite(s), itching, lesion(s), poor wound healing, and rash; derm-- Dr Danella Deis. Neuro:  Denies brief paralysis, difficulty with concentration, disturbances in coordination, falling down, headaches, inability to speak, memory loss, numbness, poor balance, seizures, sensation of room spinning, tingling, tremors, visual disturbances, and weakness. Psych:  Denies alternate hallucination ( auditory/visual), anxiety, depression, easily angered, easily tearful, irritability, mental problems, panic attacks, sense of great danger, suicidal thoughts/plans, thoughts of violence, unusual visions or sounds, and thoughts /plans of harming others; Dr Evelene Croon. Endo:  Denies cold intolerance, excessive hunger, excessive thirst, excessive urination, heat intolerance, polyuria, and weight change. Heme:  Denies abnormal bruising, bleeding, enlarge lymph nodes, fevers, pallor, and skin discoloration. Allergy:  Denies hives or rash, itching eyes, persistent infections, seasonal allergies, and sneezing.  Physical Exam  General:  Well-developed,well-nourished,in no acute distress; alert,appropriate and cooperative throughout examination Head:  Normocephalic and atraumatic without obvious abnormalities. No apparent alopecia or balding. Eyes:  pupils equal, pupils round, pupils reactive to light, and no injection.   Ears:  External ear exam shows no significant lesions or deformities.  Otoscopic examination reveals clear  canals, tympanic membranes are intact bilaterally without bulging, retraction, inflammation or  discharge. Hearing is grossly normal bilaterally. Nose:  External nasal examination shows no deformity or inflammation. Nasal mucosa are pink and moist without lesions or exudates. Mouth:  Oral mucosa and oropharynx without lesions or exudates.  Teeth in good repair. Neck:  No deformities, masses, or tenderness noted.no cervical lymphadenopathy.   Chest Wall:  No deformities, masses, or tenderness noted. Breasts:  gyn Lungs:  Normal respiratory effort, chest expands symmetrically. Lungs are clear to auscultation, no crackles or wheezes. Heart:  normal rate and no murmur.   Abdomen:  Bowel sounds positive,abdomen soft and non-tender without masses, organomegaly or hernias noted. Rectal:  gyn Genitalia:  gyn Msk:  normal ROM and no joint swelling.   Pulses:  R posterior tibial normal, R dorsalis pedis normal, R carotid normal, L posterior tibial normal, L dorsalis pedis normal, and L carotid normal.   Extremities:  No clubbing, cyanosis, edema, or deformity noted with normal full range of motion of all joints.   Neurologic:  No cranial nerve deficits noted. Station and gait are normal. Plantar reflexes are down-going bilaterally. DTRs are symmetrical throughout. Sensory, motor and coordinative functions appear intact. Skin:  Intact without suspicious lesions or rashes Cervical Nodes:  No lymphadenopathy noted Axillary Nodes:  No palpable lymphadenopathy Psych:  Cognition and judgment appear intact. Alert and cooperative with normal attention span and concentration. No apparent delusions, illusions, hallucinations   Impression & Recommendations:  Problem # 1:  PREVENTIVE HEALTH CARE (ICD-V70.0) check labs at [facility]  pap, mammo per gyn Orders: EKG w/ Interpretation (93000) UA Dipstick w/o Micro (manual) (95621)  Problem # 2:  UNSPECIFIED VITAMIN D DEFICIENCY (ICD-268.9) check vita D level Orders: EKG w/ Interpretation (93000) UA Dipstick w/o Micro (manual) (30865)  Problem # 3:   HYPERLIPIDEMIA (ICD-272.4)  Orders: EKG w/ Interpretation (93000) UA Dipstick w/o Micro (manual) (78469)  Labs Reviewed: SGOT: 21 (01/17/2010)   SGPT: 21 (01/17/2010)   HDL:63.50 (01/17/2010), 52.0 (12/16/2008)  LDL:129 (01/17/2010), 108 (12/16/2008)  Chol:200 (01/17/2010), 169 (12/16/2008)  Trig:40.0 (01/17/2010), 44 (12/16/2008)  Problem # 4:  UNSPECIFIED OSTEOPOROSIS (ICD-733.00)  Vit D:34 (01/17/2010), 36 (06/01/2009)  Problem # 5:  ANXIETY (ICD-300.00)  Her updated medication list for this problem includes:    Xanax 0.25 Mg Tabs (Alprazolam) .Marland Kitchen... 1-2 a month on average.  Discussed medication use and relaxation techniques.   Complete Medication List: 1)  Mvi  .Marland Kitchen.. 1 by mouth once daily 2)  Xanax 0.25 Mg Tabs (Alprazolam) .Marland Kitchen.. 1-2 a month on average. 3)  Retin-a 0.025 % Crea (Tretinoin) .... Apply at bedtime  Patient Instructions: 1)  268.9  V70.0  cbcd, bmp, lipid, hep, vita D,TSH -- fasting labs at [facility]   Orders Added: 1)  Est. Patient 40-64 years [99396] 2)  EKG w/ Interpretation [93000] 3)  UA Dipstick w/o Micro (manual) [81002]    Last Flu Vaccine:  given (09/15/2008 9:00:50 AM) Flu Vaccine Result Date:  08/30/2010 Flu Vaccine Result:  given Flu Vaccine Next Due:  1 yr Last PAP:  normal (10/05/2009 9:01:20 AM) PAP Result Date:  10/11/2010 PAP Result:  normal PAP Next Due:  1 yr Last Mammogram:  normal (10/05/2009 9:01:20 AM) Mammogram Result Date:  09/06/2010 Mammogram Result:  normal Mammogram Next Due:  1 yr  Laboratory Results   Urine Tests    Routine Urinalysis   Color: yellow Appearance: Clear Glucose: negative   (Normal Range: Negative) Bilirubin: negative   (Normal  Range: Negative) Ketone: negative   (Normal Range: Negative) Spec. Gravity: <1.005   (Normal Range: 1.003-1.035) Blood: negative   (Normal Range: Negative) pH: 7.0   (Normal Range: 5.0-8.0) Protein: negative   (Normal Range: Negative) Urobilinogen: 0.2   (Normal Range:  0-1) Nitrite: negative   (Normal Range: Negative) Leukocyte Esterace: negative   (Normal Range: Negative)

## 2011-01-23 NOTE — Letter (Signed)
Summary: [employer] [facility]  [employer] [facility] Risk Profile   Imported By: Maryln Gottron 01/18/2011 09:14:29  _____________________________________________________________________  External Attachment:    Type:   Image     Comment:   External Document

## 2011-01-25 ENCOUNTER — Encounter: Payer: Self-pay | Admitting: Family Medicine

## 2011-02-13 NOTE — Letter (Signed)
Summary: [facility]  [facility]   Imported By: Maryln Gottron 02/02/2011 11:16:28  _____________________________________________________________________  External Attachment:    Type:   Image     Comment:   External Document

## 2011-03-07 ENCOUNTER — Encounter: Payer: Self-pay | Admitting: Family Medicine

## 2011-03-07 ENCOUNTER — Ambulatory Visit (INDEPENDENT_AMBULATORY_CARE_PROVIDER_SITE_OTHER): Payer: Commercial Managed Care - PPO | Admitting: Family Medicine

## 2011-03-07 VITALS — BP 120/72 | Temp 97.8°F | Wt 145.2 lb

## 2011-03-07 DIAGNOSIS — R202 Paresthesia of skin: Secondary | ICD-10-CM | POA: Insufficient documentation

## 2011-03-07 DIAGNOSIS — R200 Anesthesia of skin: Secondary | ICD-10-CM

## 2011-03-07 DIAGNOSIS — R209 Unspecified disturbances of skin sensation: Secondary | ICD-10-CM

## 2011-03-07 NOTE — Progress Notes (Signed)
  Subjective:    Patient ID: Sherry Jacobs, female    DOB: 1966/02/22, 45 y.o.   MRN: [id number]  HPI Tingling of face- dx'd w/ autoimmune dz 7 yrs ago, sees Dr Corliss Skains.  Was previously on Plaquenil, off this for 2 yrs.  Has had hx of 'tingling' previously but it usually only lasts 1-2 days.  This time sxs have been present x2 weeks.  ANA is 160 (less than the 320 in January).  Tingling initially started on tip of nose and then moved to L nostril, has had tingling of bilateral upper lip, randomly occuring on both cheeks.  Has had sxs on L hand as well.  sxs are inconsistent- no pattern to time of day, duration.  Typically only lasts 'a few seconds'.  Denies weakness or drooping.  Increased nasal congestion recently.  Denies increased stress recently.  Has seen neuro previously- had normal brain MRI (7 yrs ago).   Review of Systems For ROS see HPI     Objective:   Physical Exam  Constitutional: She is oriented to person, place, and time. She appears well-developed and well-nourished. No distress.  HENT:  Head: Normocephalic and atraumatic.  Right Ear: Tympanic membrane normal.  Left Ear: Tympanic membrane normal.  Nose: No mucosal edema, rhinorrhea, sinus tenderness or nasal deformity. Right sinus exhibits no maxillary sinus tenderness and no frontal sinus tenderness. Left sinus exhibits no maxillary sinus tenderness and no frontal sinus tenderness.  Mouth/Throat: Uvula is midline, oropharynx is clear and moist and mucous membranes are normal. No oropharyngeal exudate, posterior oropharyngeal edema or posterior oropharyngeal erythema.  Eyes: Conjunctivae and EOM are normal. Pupils are equal, round, and reactive to light.  Neck: Normal range of motion. Neck supple. No thyromegaly present.  Cardiovascular: Normal rate, regular rhythm, normal heart sounds and intact distal pulses.   Pulmonary/Chest: Effort normal and breath sounds normal. No respiratory distress.  Lymphadenopathy:    She has  no cervical adenopathy.  Neurological: She is alert and oriented to person, place, and time. She has normal strength and normal reflexes. No cranial nerve deficit or sensory deficit. Coordination normal.       No neuro deficits or numbness in office  Psychiatric: Her mood appears anxious.          Assessment & Plan:

## 2011-03-07 NOTE — Patient Instructions (Signed)
We will notify you of your lab results and your neuro appt (please call with the name of the Neurologist) Try and relax- stress will worsen your symptoms If your symptoms worsen or you have associated weakness, slurred speech, or other concerns please go to the ER Hang in there!!

## 2011-03-08 LAB — CBC WITH DIFFERENTIAL/PLATELET
Basophils Relative: 0.1 % (ref 0.0–3.0)
Eosinophils Relative: 0.9 % (ref 0.0–5.0)
HCT: 35.7 % — ABNORMAL LOW (ref 36.0–46.0)
Hemoglobin: 12.2 g/dL (ref 12.0–15.0)
MCV: 85.6 fl (ref 78.0–100.0)
Monocytes Absolute: 0.5 10*3/uL (ref 0.1–1.0)
Neutrophils Relative %: 75.7 % (ref 43.0–77.0)
RBC: 4.17 Mil/uL (ref 3.87–5.11)
WBC: 3.5 10*3/uL — ABNORMAL LOW (ref 4.5–10.5)

## 2011-03-08 LAB — BASIC METABOLIC PANEL
BUN: 11 mg/dL (ref 6–23)
Chloride: 101 mEq/L (ref 96–112)
GFR: 95.71 mL/min (ref >60.00)
Potassium: 3.9 mEq/L (ref 3.5–5.1)

## 2011-03-08 LAB — FOLATE: Folate: 22.3 ng/mL (ref >5.9)

## 2011-03-08 LAB — VITAMIN B12: Vitamin B-12: 303 pg/mL (ref 211–911)

## 2011-03-08 LAB — TSH: TSH: 1.33 u[IU]/mL (ref 0.35–5.50)

## 2011-03-12 ENCOUNTER — Telehealth: Payer: Self-pay | Admitting: *Deleted

## 2011-03-12 ENCOUNTER — Encounter: Payer: Self-pay | Admitting: *Deleted

## 2011-03-12 ENCOUNTER — Other Ambulatory Visit: Payer: Self-pay | Admitting: Family Medicine

## 2011-03-12 DIAGNOSIS — R200 Anesthesia of skin: Secondary | ICD-10-CM

## 2011-03-12 LAB — B. BURGDORFI ANTIBODIES BY WB: B burgdorferi IgM Abs (IB): NEGATIVE

## 2011-03-12 NOTE — Telephone Encounter (Signed)
Order entered, will forward to [facility].

## 2011-03-12 NOTE — Telephone Encounter (Signed)
Pt notified and requests Sherry Jacobs.

## 2011-03-12 NOTE — Telephone Encounter (Signed)
Pt recently seen and is still c/o increase, more frequent tingling on left nostril. Pt requesting MRI to be donw. Pt notes that you advise her that CT scan is next option however she has had several CT scan in the pass 7 years and is worry about radiation.Please advise

## 2011-03-12 NOTE — Telephone Encounter (Signed)
We can attempt MRI but this will need to be approved by her insurance.  Please enter order

## 2011-03-12 NOTE — Telephone Encounter (Signed)
Patient scheduled @ [facility] on 03-14-2011 & I informed her by phone/rh 03-12-11.

## 2011-03-12 NOTE — Telephone Encounter (Signed)
It would be MRI of brain, dx facial numbness

## 2011-03-12 NOTE — Telephone Encounter (Signed)
I do not see nasal/sinus MRI. Please advise of specific test needed and dx.

## 2011-03-14 ENCOUNTER — Other Ambulatory Visit (INDEPENDENT_AMBULATORY_CARE_PROVIDER_SITE_OTHER): Payer: Commercial Managed Care - PPO

## 2011-03-14 ENCOUNTER — Ambulatory Visit (HOSPITAL_COMMUNITY): Payer: Commercial Managed Care - PPO

## 2011-03-14 ENCOUNTER — Other Ambulatory Visit: Payer: Self-pay | Admitting: Family Medicine

## 2011-03-14 ENCOUNTER — Other Ambulatory Visit: Payer: Commercial Managed Care - PPO

## 2011-03-14 ENCOUNTER — Ambulatory Visit (HOSPITAL_COMMUNITY)
Admission: RE | Admit: 2011-03-14 | Discharge: 2011-03-14 | Disposition: A | Discharge status: Home / Self Care | Payer: 59 | Source: Ambulatory Visit | Attending: Family Medicine | Admitting: Family Medicine

## 2011-03-14 ENCOUNTER — Encounter: Payer: Self-pay | Admitting: *Deleted

## 2011-03-14 DIAGNOSIS — E348 Other specified endocrine disorders: Secondary | ICD-10-CM | POA: Insufficient documentation

## 2011-03-14 DIAGNOSIS — R209 Unspecified disturbances of skin sensation: Secondary | ICD-10-CM | POA: Insufficient documentation

## 2011-03-14 DIAGNOSIS — R200 Anesthesia of skin: Secondary | ICD-10-CM

## 2011-03-14 DIAGNOSIS — D709 Neutropenia, unspecified: Secondary | ICD-10-CM

## 2011-03-14 LAB — CBC WITH DIFFERENTIAL/PLATELET
Basophils Absolute: 0 10*3/uL (ref 0.0–0.1)
Eosinophils Absolute: 0 10*3/uL (ref 0.0–0.7)
HCT: 38.5 % (ref 36.0–46.0)
Hemoglobin: 13.1 g/dL (ref 12.0–15.0)
Lymphocytes Relative: 25.2 % (ref 12.0–46.0)
Lymphs Abs: 1.1 10*3/uL (ref 0.7–4.0)
MCHC: 34 g/dL (ref 30.0–36.0)
Neutro Abs: 2.9 10*3/uL (ref 1.4–7.7)
Platelets: 181 10*3/uL (ref 150.0–400.0)
RDW: 14.2 % (ref 11.5–14.6)

## 2011-03-14 MED ORDER — GADOBENATE DIMEGLUMINE 529 MG/ML IV SOLN
15.0000 mL | Freq: Once | INTRAVENOUS | Status: AC | PRN
  Administered 2011-03-14: 15 mL via INTRAVENOUS

## 2011-03-14 NOTE — Telephone Encounter (Signed)
Removed MRI--entered in Error

## 2011-03-14 NOTE — Telephone Encounter (Signed)
Addended by: Almeta Monas on: 03/14/2011 12:31 PM   Modules accepted: Orders

## 2011-03-15 ENCOUNTER — Encounter: Payer: Self-pay | Admitting: Family Medicine

## 2011-03-15 ENCOUNTER — Ambulatory Visit (INDEPENDENT_AMBULATORY_CARE_PROVIDER_SITE_OTHER): Payer: Commercial Managed Care - PPO | Admitting: Family Medicine

## 2011-03-15 VITALS — BP 102/70 | HR 60 | Wt 145.2 lb

## 2011-03-15 DIAGNOSIS — R200 Anesthesia of skin: Secondary | ICD-10-CM

## 2011-03-15 DIAGNOSIS — R202 Paresthesia of skin: Secondary | ICD-10-CM

## 2011-03-15 DIAGNOSIS — R209 Unspecified disturbances of skin sensation: Secondary | ICD-10-CM

## 2011-03-15 NOTE — Progress Notes (Signed)
  Subjective:    Patient ID: Sherry Jacobs, female    DOB: [birth year], 45 y.o.   MRN: [id number]  HPI    Review of Systems     Objective:   Physical Exam     Entered in error  Assessment & Plan:

## 2011-03-15 NOTE — Progress Notes (Signed)
  Subjective:    Patient ID: Sherry Jacobs, female    DOB: Jul 19, 1966, 45 y.o.   MRN: [id number]  HPI Pt here to go over MRI and visit with Dr Craige Cotta the neuro.  Neuro would like her to be referred back to ENT that did her surgery even though it was on the other side.  No new symptoms--- see last ov.    Review of Systems As above    Objective:   Physical Exam  Constitutional: She appears well-developed and well-nourished.  Psychiatric: She has a normal mood and affect. Her behavior is normal. Judgment and thought content normal.          Assessment & Plan:

## 2011-03-15 NOTE — Assessment & Plan Note (Signed)
F/u neuro Will refer to ent at neuro request

## 2011-03-17 NOTE — Assessment & Plan Note (Addendum)
Check labs to r/o metabolic/infectious cause for tingling.  Re-refer to neuro.  Pt would like repeat MRI since last test was ~7 yrs ago.  Will initiate work up and pt to f/u w/ neuro.  Reviewed supportive care and red flags that should prompt return.  Pt expressed understanding and is in agreement w/ plan. Total time spent w/ pt- 27 minutes

## 2011-03-28 ENCOUNTER — Ambulatory Visit (INDEPENDENT_AMBULATORY_CARE_PROVIDER_SITE_OTHER): Payer: Commercial Managed Care - PPO | Admitting: Internal Medicine

## 2011-03-28 ENCOUNTER — Encounter: Payer: Self-pay | Admitting: Internal Medicine

## 2011-03-28 DIAGNOSIS — R21 Rash and other nonspecific skin eruption: Secondary | ICD-10-CM

## 2011-03-28 MED ORDER — HYDROCORTISONE 2.5 % EX CREA: TOPICAL_CREAM | Freq: Two times a day (BID) | CUTANEOUS | Status: DC

## 2011-03-28 NOTE — Progress Notes (Signed)
  Subjective:    Patient ID: Sherry Jacobs, female    DOB: Oct 28, 1966, 45 y.o.   MRN: [id number]  HPI  Rash in the left thumb today. The area is itching, not hurting. She is concerned because,  Shingles?  Past Medical History  Diagnosis Date  . IBS (irritable bowel syndrome)   . Fibromyalgia   . Anxiety   . Tachycardia    Past Surgical History  Procedure Date  . Appendectomy 1981  . Brachial cyst 2005     Review of Systems She had some tingling by the nose, status post a MRI which was negative s/p a neurology evaluation. She has been refer to ENT. Denies fevers When asked, admits that she did some yard work 3 days ago    Objective:   Physical Exam Alert oriented in no apparent distress. Right hand normal Left hand: At the thumb, proximal to the nail, she has 3 papular, slightly red, nonblistery lesions. No evidence of cellulitis, no scaliness  .      Assessment & Plan:

## 2011-03-28 NOTE — Assessment & Plan Note (Addendum)
Patient quite concerned about the rash representing shingles. I don't think that is the case, we discussed what shingles would look like. I think she has contact dermatitis, recommend hydrocortisone cream, wash all the clothing she used in the yard she'll call if she has more problems.

## 2011-04-10 NOTE — Letter (Signed)
February 18, 2009    Lelon Perla, DO  [address] [place], [place] [address]   RE:  FERNANDO, TORRY  MRN:  [id number]  /  DOB:  [birth year]   Dear Myrene Buddy,   Ms. Harris comes in followup for the monitor that we put on.  She had  3 episodes.  The one that she activated said 7:26 a.m. although was in  the p.m.  It had no associated arrhythmias.  The 3 times that she had  problems, nothing was detected.  Unfortunately, the manual recordings  while out of time cinq, if we presume they were right, showed nothing.   She also asked about aspirin in the context of her Raynaud's.   Her other medications include Plaquenil which apparently is going to be  stopped in July.   On examination today, her blood pressure was 100/70, her pulse was 78.  Her lungs were clear.  Her heart sounds were regular.  Extremities  without edema.   We reviewed her event recorder today as noted above.   IMPRESSION:  1. Palpitations without documented arrhythmia.  2. History of elevated homocysteine levels while she was taking folic      acid, that necessarily be continued.   I will try and look into that other issue, and we will get that  information back to her.  I will look forward to seeing her at your  request.    Sincerely,      Duke Salvia, MD, [facility]  Electronically Signed    SCK/MedQ  DD: 02/18/2009  DT: 02/19/2009  Job #: [id number]

## 2011-04-10 NOTE — Letter (Signed)
January 03, 2009    Lelon Perla, DO  [address] [place], [place] [address]   RE:  LOREE, SHEHATA  MRN:  [id number]  /  DOB:  [birth year]   Dear Myrene Buddy,   It was a pleasure seeing Rosey Bath at your request today regarding  palpitations.   As you know, she is a 45 year old married mother of 2 who works as a  Engineer, civil (consulting) at [employer] who has noted over the last couple  of months palpitations when she goes up to bed and straight away lies  down to go to sleep.  She had irregular heartbeat that last few seconds  and then abate.   She had undergone event recording about a year and a half ago for tachy  palpitations, which abated when she discontinued her caffeine.  She does  not have orthostatic intolerance, shower intolerance, exercise  intolerance.   Her past cardiac history is notable for question of a heart murmur.  It  has been heard again, on again and off again.  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   Her past surgical history is notable for cyst surgery removal in her  neck in 2005 and remote appendectomy.   SOCIAL HISTORY:  As above.  She has 2 children as noted.  She does not  use cigarettes, alcohol, or recreational drugs.   Her current medications include Plaquenil 200 every other day and  vitamin D.   She is allergic to SULFA.   PHYSICAL EXAMINATION:  GENERAL:  On examination, she is a middle-aged  Caucasian female appearing her stated age of 40.  VITAL SIGNS:  Her blood pressure is 110/70 with a pulse of 77, her  weight is 146.  HEENT:   Demonstrated no icterus or xanthoma.  NECK:  Her neck veins were flat.  Her carotids were brisk bilaterally  without bruits.  BACK:  Without kyphosis, scoliosis.  LUNGS:  Clear.  CARDIAC:  Heart sounds were regular without murmurs or gallops.  ABDOMEN:  Soft with active bowel sounds without midline pulsation or  hepatomegaly.  EXTREMITIES:  Femoral pulses were 2+, distal pulses were intact.  There  was no clubbing, cyanosis, or edema.  NEUROLOGIC:  Grossly normal.  SKIN:  Warm and dry.   Electrocardiogram dated today demonstrated sinus rhythm, rate 77 with  normal intervals   IMPRESSION:  1. Exertional palpitations occurring prior to sleep.  2. Prior history of sinus tachycardia associated with caffeine use.  3. 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  4. 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   Myrene Buddy, Ms. Hoge has palpitations when she go upstairs, I am not  sure what the mechanism of that is.  Event recording would clearly be  our best tool at elucidating that.  I reviewed this with her and she  would like to proceed that way.   She asked me about whether she needs to continue folic acid, and I  suggested that she  should follow up with the genetic team at [facility]  where the diagnosis was made.   I told her that I will be glad to see her again in 5 weeks or so  following the event recorder.   Thank you for the consultation.    Sincerely,      Duke Salvia, MD, [facility]  Electronically Signed    SCK/MedQ  DD: 01/03/2009  DT: 01/04/2009  Job #: [id number]

## 2011-04-13 NOTE — Op Note (Signed)
NAMEFATHIMA, Sherry Jacobs                          ACCOUNT NO.:  [id number]   MEDICAL RECORD NO.:  [id number]                   PATIENT TYPE:  INP   LOCATION:  2550                                 FACILITY:  [facility]   PHYSICIAN:  Jefry H. Pollyann Kennedy, M.D.                DATE OF BIRTH:  [birth year]   DATE OF PROCEDURE:  08/02/2004  DATE OF DISCHARGE:                                 OPERATIVE REPORT   PREOPERATIVE DIAGNOSIS:  Right parapharyngeal mass.   POSTOPERATIVE DIAGNOSIS:  Right parapharyngeal mass.   PROCEDURE:  Right modified neck dissection with removal of right  parapharyngeal space mass.   SURGEON:  Jefry H. Pollyann Kennedy, M.D.   ASSISTANT:  Kinnie Scales. Annalee Genta, M.D.   ANESTHESIA:  General endotracheal anesthesia was used.   COMPLICATIONS:  None.   ESTIMATED BLOOD LOSS:  None.   FINDINGS:  4 cm soft cystic mass in the right superior parapharyngeal space  containing thick mucoid material.  Frozen section diagnosis consistent with  bronchogenic cysts.  Multiple nonpathologic appearing lymph nodes present in  levels II and III.   REFERRING PHYSICIAN:  Lilla Shook, M.D.   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  Risks, benefits, alternatives, complications of the procedure were  explained to the patient who seemed to understand and agreed to surgery.   DESCRIPTION OF PROCEDURE:  The patient was taken to the operating room and  placed on the operating table in the supine position.  Following induction  of general endotracheal, the right neck was prepped and draped in the usual  sterile fashion.  An incision was outlined including the preauricular crease  for possible parotidectomy down along the posterior aspect of the earlobe,  across the mastoid process, and posteriorly down the neck and then  anteriorly toward the cricoid.  The mastoid tip to the anterior edge of  the  sternocleidomastoid section of the incision was the only part that was  necessary.  Electrocautery was used to incise the skin and subcutaneous  tissue and through the platysma.  Subplatysmal flap was developed superiorly  to the angle of the mandible.  The great auricular nerve was identified and  preserved.  A neck dissection encompassing levels II and III was performed.  All fibrofatty tissue and lymph nodes were dissected cleanly out of these  two levels of the neck.  The submandibular gland was kept in place and  served as the anterosuperior limit of dissection.  The cutaneous branches of  the cervical plexus were also preserved and served as the posterior limit of  dissection.  The omohyoid muscle was the inferior limit.  The carotid sheath  was the medial limit as the carotid, vagus, and internal jugular vein were  kept intact.  Multiple lymph nodes were identified throughout this region.  The  digastric muscle was divided to facilitate access to the parapharyngeal  space.  The spinal accessory nerve and hypoglossal nerve were identified and  preserved.  With careful retraction on the angle of the mandible and on the  posterior upper part of the sternocleidomastoid muscle, the parapharyngeal  space was nicely exposed and the cystic mass was identified.  Attempts were  made to carefully dissect out the cyst from surrounding tissue.  This was  performed successfully although the cyst was ruptured and thick mucoid  material was obtained.  The cyst remnant was excised in its entirety and  sent for pathologic evaluation with frozen section.  The glossopharyngeal  nerve was identified just medial and superior to the cyst.  This was kept  intact.  The wound was irrigated with saline and silk ties and bipolar  cautery were used to assure complete hemostasis.  The wound was closed in  layers using 4-0 chromic on the platysma layer and running 5-0 nylon on the  skin.  The wound was drained  with a #10 [place] Blakely drain that exited  through the inferior aspect of the incision and secured in place with a  nylon suture.  The patient was then awakened, extubated, and transferred to  the recovery room in stable condition.                                               Jefry H. Pollyann Kennedy, M.D.    JHR/MEDQ  D:  08/02/2004  T:  08/02/2004  Job:  [id number]   cc:   Lilla Shook, M.D.  [address], [address]  [place]  [place] [address]  Fax: [phone]

## 2011-05-10 ENCOUNTER — Ambulatory Visit: Payer: Commercial Managed Care - PPO

## 2011-05-11 ENCOUNTER — Ambulatory Visit (INDEPENDENT_AMBULATORY_CARE_PROVIDER_SITE_OTHER): Payer: 59 | Admitting: *Deleted

## 2011-05-11 DIAGNOSIS — Z23 Encounter for immunization: Secondary | ICD-10-CM

## 2011-05-11 DIAGNOSIS — Z0000 Encounter for general adult medical examination without abnormal findings: Secondary | ICD-10-CM

## 2011-05-11 DIAGNOSIS — Z79899 Other long term (current) drug therapy: Secondary | ICD-10-CM

## 2011-05-11 MED ORDER — PNEUMOCOCCAL VAC POLYVALENT 25 MCG/0.5ML IJ INJ
0.5000 mL | INJECTION | Freq: Once | INTRAMUSCULAR | Status: AC
  Administered 2011-05-11: 0.5 mL via INTRAMUSCULAR

## 2011-08-22 ENCOUNTER — Encounter: Payer: Self-pay | Admitting: Family Medicine

## 2011-08-23 LAB — CBC
HCT: 39.8
Hemoglobin: 13.6
MCHC: 34.1
MCV: 84.6
RBC: 4.71

## 2011-08-23 LAB — DIFFERENTIAL
Basophils Relative: 0
Eosinophils Absolute: 0.1
Eosinophils Relative: 1
Monocytes Absolute: 0.6
Monocytes Relative: 10
Neutrophils Relative %: 75

## 2011-10-15 ENCOUNTER — Other Ambulatory Visit (HOSPITAL_COMMUNITY): Payer: Self-pay | Admitting: Obstetrics and Gynecology

## 2011-10-15 DIAGNOSIS — Z803 Family history of malignant neoplasm of breast: Secondary | ICD-10-CM

## 2011-12-19 ENCOUNTER — Encounter: Payer: 59 | Admitting: Family Medicine

## 2012-01-08 ENCOUNTER — Encounter: Payer: 59 | Admitting: Family Medicine

## 2012-01-09 ENCOUNTER — Ambulatory Visit (INDEPENDENT_AMBULATORY_CARE_PROVIDER_SITE_OTHER): Payer: 59 | Admitting: Family Medicine

## 2012-01-09 ENCOUNTER — Encounter: Payer: Self-pay | Admitting: Family Medicine

## 2012-01-09 VITALS — BP 115/75 | HR 77 | Temp 97.4°F | Ht 67.0 in | Wt 150.6 lb

## 2012-01-09 DIAGNOSIS — N390 Urinary tract infection, site not specified: Secondary | ICD-10-CM | POA: Insufficient documentation

## 2012-01-09 LAB — POCT URINALYSIS DIPSTICK
Blood, UA: NEGATIVE
Spec Grav, UA: 1.01
Urobilinogen, UA: 0.2

## 2012-01-09 MED ORDER — CEPHALEXIN 500 MG PO CAPS: 500.0000 mg | ORAL_CAPSULE | Freq: Two times a day (BID) | ORAL | Status: AC

## 2012-01-09 NOTE — Assessment & Plan Note (Signed)
Pt's sxs consistent w/ infxn despite normal UA.  Will send for cx and start abx in the interim.  Pt expressed understanding and is in agreement w/ plan.

## 2012-01-09 NOTE — Patient Instructions (Signed)
Your symptoms are consistent with a bladder infection Start the keflex twice daily Drink plenty of fluids Call with any questions or concerns Hang in there!!!

## 2012-01-09 NOTE — Progress Notes (Signed)
  Subjective:    Patient ID: Sherry Jacobs, female    DOB: Nov 17, 1966, 46 y.o.   MRN: [id number]  HPI ? UTI- reports frequency and urgency this AM.  + hesitancy.  Some straining w/ urination but 'not terribly uncomfortable yet'.  No blood, no back pain, fevers.  Hx of similar but not for quite some time.     Review of Systems For ROS see HPI     Objective:   Physical Exam  Vitals reviewed. Constitutional: She appears well-developed and well-nourished. No distress.  Abdominal: Soft. She exhibits no distension. There is no tenderness (no suprapubic or CVA tenderness).          Assessment & Plan:

## 2012-01-09 NOTE — Progress Notes (Signed)
Addended by: Derry Lory A on: 01/09/2012 02:14 PM   Modules accepted: Orders

## 2012-01-15 ENCOUNTER — Encounter: Payer: Self-pay | Admitting: *Deleted

## 2012-02-14 ENCOUNTER — Encounter: Payer: Self-pay | Admitting: Family Medicine

## 2012-02-14 ENCOUNTER — Ambulatory Visit (INDEPENDENT_AMBULATORY_CARE_PROVIDER_SITE_OTHER): Payer: 59 | Admitting: Family Medicine

## 2012-02-14 VITALS — BP 108/68 | HR 88 | Resp 12 | Ht 66.75 in | Wt 152.8 lb

## 2012-02-14 DIAGNOSIS — R002 Palpitations: Secondary | ICD-10-CM

## 2012-02-14 DIAGNOSIS — Z0000 Encounter for general adult medical examination without abnormal findings: Secondary | ICD-10-CM

## 2012-02-14 DIAGNOSIS — E785 Hyperlipidemia, unspecified: Secondary | ICD-10-CM

## 2012-02-14 DIAGNOSIS — E559 Vitamin D deficiency, unspecified: Secondary | ICD-10-CM

## 2012-02-14 NOTE — Progress Notes (Signed)
  Subjective:     Sherry Jacobs is a 46 y.o. female and is here for a comprehensive physical exam. The patient reports problems - palpitations--they are occurring more often now.  History   Social History  . Marital Status: Married    Spouse Name: N/A    Number of Children: N/A  . Years of Education: N/A   Occupational History  . Not on file.   Social History Main Topics  . Smoking status: Never Smoker   . Smokeless tobacco: Not on file  . Alcohol Use: No  . Drug Use: No  . Sexually Active: Yes -- Female partner(s)   Other Topics Concern  . 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 Pap Smear  02/04/1983  . Influenza Vaccine  08/26/2012  . Tetanus/tdap  12/15/2018    The following portions of the patient's history were reviewed and updated as appropriate: allergies, current medications, past family history, past medical history, past social history, past surgical history and problem list.  Review of Systems Review of Systems  Constitutional: Negative for activity change, appetite change and fatigue.  HENT: Negative for hearing loss, congestion, tinnitus and ear discharge.  dentist q53m Eyes: Negative for visual disturbance (see optho q1y -- vision corrected to 20/20 with glasses).  Respiratory: Negative for cough, chest tightness and shortness of breath.   Cardiovascular: Negative for chest pain, and leg swelling.  + palpitations  Gastrointestinal: Negative for abdominal pain, diarrhea, constipation and abdominal distention.  Genitourinary: Negative for urgency, frequency, decreased urine volume and difficulty urinating.  Musculoskeletal: Negative for back pain, arthralgias and gait problem.  Skin: Negative for color change, pallor and rash.  Neurological: Negative for dizziness, light-headedness, numbness and headaches.  Hematological: Negative for adenopathy. Does not bruise/bleed  easily.  Psychiatric/Behavioral: Negative for suicidal ideas, confusion, sleep disturbance, self-injury, dysphoric mood, decreased concentration and agitation.       Objective:    BP 108/68  Pulse 88  Resp 12  Ht 5' 6.75" (1.695 m)  Wt 152 lb 12.8 oz (69.31 kg)  BMI 24.11 kg/m2  SpO2 98% General appearance: alert, cooperative, appears stated age and no distress Head: Normocephalic, without obvious abnormality, atraumatic Eyes: conjunctivae/corneas clear. PERRL, EOM's intact. Fundi benign. Ears: normal TM's and external ear canals both ears Nose: Nares normal. Septum midline. Mucosa normal. No drainage or sinus tenderness. Throat: lips, mucosa, and tongue normal; teeth and gums normal Neck: no adenopathy, no carotid bruit, no JVD, supple, symmetrical, trachea midline and thyroid not enlarged, symmetric, no tenderness/mass/nodules Back: symmetric, no curvature. ROM normal. No CVA tenderness. Lungs: clear to auscultation bilaterally Breasts: gyn Heart: regular rate and rhythm, S1, S2 normal, no murmur, click, rub or gallop Abdomen: soft, non-tender; bowel sounds normal; no masses,  no organomegaly Pelvic: gyn Extremities: extremities normal, atraumatic, no cyanosis or edema Pulses: 2+ and symmetric Skin: Skin color, texture, turgor normal. No rashes or lesions Lymph nodes: Cervical, supraclavicular, and axillary nodes normal. Neurologic: Alert and oriented X 3, normal strength and tone. Normal symmetric reflexes. Normal coordination and gait psych-- + anxiety/ depression    Assessment:    Healthy female exam.      Plan:  Check labs ghm utd    See After Visit Summary for Counseling Recommendations

## 2012-02-14 NOTE — Patient Instructions (Signed)
Preventive Care for Adults, Female A healthy lifestyle and preventive care can promote health and wellness. Preventive health guidelines for women include the following key practices.  A routine yearly physical is a good way to check with your caregiver about your health and preventive screening. It is a chance to share any concerns and updates on your health, and to receive a thorough exam.   Visit your dentist for a routine exam and preventive care every 6 months. Brush your teeth twice a day and floss once a day. Good oral hygiene prevents tooth decay and gum disease.   The frequency of eye exams is based on your age, health, family medical history, use of contact lenses, and other factors. Follow your caregiver's recommendations for frequency of eye exams.   Eat a healthy diet. Foods like vegetables, fruits, whole grains, low-fat dairy products, and lean protein foods contain the nutrients you need without too many calories. Decrease your intake of foods high in solid fats, added sugars, and salt. Eat the right amount of calories for you.Get information about a proper diet from your caregiver, if necessary.   Regular physical exercise is one of the most important things you can do for your health. Most adults should get at least 150 minutes of moderate-intensity exercise (any activity that increases your heart rate and causes you to sweat) each week. In addition, most adults need muscle-strengthening exercises on 2 or more days a week.   Maintain a healthy weight. The body mass index (BMI) is a screening tool to identify possible weight problems. It provides an estimate of body fat based on height and weight. Your caregiver can help determine your BMI, and can help you achieve or maintain a healthy weight.For adults 20 years and older:   A BMI below 18.5 is considered underweight.   A BMI of 18.5 to 24.9 is normal.   A BMI of 25 to 29.9 is considered overweight.   A BMI of 30 and above is  considered obese.   Maintain normal blood lipids and cholesterol levels by exercising and minimizing your intake of saturated fat. Eat a balanced diet with plenty of fruit and vegetables. Blood tests for lipids and cholesterol should begin at age 20 and be repeated every 5 years. If your lipid or cholesterol levels are high, you are over 50, or you are at high risk for heart disease, you may need your cholesterol levels checked more frequently.Ongoing high lipid and cholesterol levels should be treated with medicines if diet and exercise are not effective.   If you smoke, find out from your caregiver how to quit. If you do not use tobacco, do not start.   If you are pregnant, do not drink alcohol. If you are breastfeeding, be very cautious about drinking alcohol. If you are not pregnant and choose to drink alcohol, do not exceed 1 drink per day. One drink is considered to be 12 ounces (355 mL) of beer, 5 ounces (148 mL) of wine, or 1.5 ounces (44 mL) of liquor.   Avoid use of street drugs. Do not share needles with anyone. Ask for help if you need support or instructions about stopping the use of drugs.   High blood pressure causes heart disease and increases the risk of stroke. Your blood pressure should be checked at least every 1 to 2 years. Ongoing high blood pressure should be treated with medicines if weight loss and exercise are not effective.   If you are 55 to 47   years old, ask your caregiver if you should take aspirin to prevent strokes.   Diabetes screening involves taking a blood sample to check your fasting blood sugar level. This should be done once every 3 years, after age 45, if you are within normal weight and without risk factors for diabetes. Testing should be considered at a younger age or be carried out more frequently if you are overweight and have at least 1 risk factor for diabetes.   Breast cancer screening is essential preventive care for women. You should practice "breast  self-awareness." This means understanding the normal appearance and feel of your breasts and may include breast self-examination. Any changes detected, no matter how small, should be reported to a caregiver. Women in their 20s and 30s should have a clinical breast exam (CBE) by a caregiver as part of a regular health exam every 1 to 3 years. After age 40, women should have a CBE every year. Starting at age 40, women should consider having a mammography (breast X-ray test) every year. Women who have a family history of breast cancer should talk to their caregiver about genetic screening. Women at a high risk of breast cancer should talk to their caregivers about having magnetic resonance imaging (MRI) and a mammography every year.   The Pap test is a screening test for cervical cancer. A Pap test can show cell changes on the cervix that might become cervical cancer if left untreated. A Pap test is a procedure in which cells are obtained and examined from the lower end of the uterus (cervix).   Women should have a Pap test starting at age 21.   Between ages 21 and 29, Pap tests should be repeated every 2 years.   Beginning at age 30, you should have a Pap test every 3 years as long as the past 3 Pap tests have been normal.   Some women have medical problems that increase the chance of getting cervical cancer. Talk to your caregiver about these problems. It is especially important to talk to your caregiver if a new problem develops soon after your last Pap test. In these cases, your caregiver may recommend more frequent screening and Pap tests.   The above recommendations are the same for women who have or have not gotten the vaccine for human papillomavirus (HPV).   If you had a hysterectomy for a problem that was not cancer or a condition that could lead to cancer, then you no longer need Pap tests. Even if you no longer need a Pap test, a regular exam is a good idea to make sure no other problems are  starting.   If you are between ages 65 and 70, and you have had normal Pap tests going back 10 years, you no longer need Pap tests. Even if you no longer need a Pap test, a regular exam is a good idea to make sure no other problems are starting.   If you have had past treatment for cervical cancer or a condition that could lead to cancer, you need Pap tests and screening for cancer for at least 20 years after your treatment.   If Pap tests have been discontinued, risk factors (such as a new sexual partner) need to be reassessed to determine if screening should be resumed.   The HPV test is an additional test that may be used for cervical cancer screening. The HPV test looks for the virus that can cause the cell changes on the cervix.   The cells collected during the Pap test can be tested for HPV. The HPV test could be used to screen women aged 30 years and older, and should be used in women of any age who have unclear Pap test results. After the age of 30, women should have HPV testing at the same frequency as a Pap test.   Colorectal cancer can be detected and often prevented. Most routine colorectal cancer screening begins at the age of 50 and continues through age 75. However, your caregiver may recommend screening at an earlier age if you have risk factors for colon cancer. On a yearly basis, your caregiver may provide home test kits to check for hidden blood in the stool. Use of a small camera at the end of a tube, to directly examine the colon (sigmoidoscopy or colonoscopy), can detect the earliest forms of colorectal cancer. Talk to your caregiver about this at age 50, when routine screening begins. Direct examination of the colon should be repeated every 5 to 10 years through age 75, unless early forms of pre-cancerous polyps or small growths are found.   Hepatitis C blood testing is recommended for all people born from 1945 through 1965 and any individual with known risks for hepatitis C.    Practice safe sex. Use condoms and avoid high-risk sexual practices to reduce the spread of sexually transmitted infections (STIs). STIs include gonorrhea, chlamydia, syphilis, trichomonas, herpes, HPV, and human immunodeficiency virus (HIV). Herpes, HIV, and HPV are viral illnesses that have no cure. They can result in disability, cancer, and death. Sexually active women aged 25 and younger should be checked for chlamydia. Older women with new or multiple partners should also be tested for chlamydia. Testing for other STIs is recommended if you are sexually active and at increased risk.   Osteoporosis is a disease in which the bones lose minerals and strength with aging. This can result in serious bone fractures. The risk of osteoporosis can be identified using a bone density scan. Women ages 65 and over and women at risk for fractures or osteoporosis should discuss screening with their caregivers. Ask your caregiver whether you should take a calcium supplement or vitamin D to reduce the rate of osteoporosis.   Menopause can be associated with physical symptoms and risks. Hormone replacement therapy is available to decrease symptoms and risks. You should talk to your caregiver about whether hormone replacement therapy is right for you.   Use sunscreen with sun protection factor (SPF) of 30 or more. Apply sunscreen liberally and repeatedly throughout the day. You should seek shade when your shadow is shorter than you. Protect yourself by wearing long sleeves, pants, a wide-brimmed hat, and sunglasses year round, whenever you are outdoors.   Once a month, do a whole body skin exam, using a mirror to look at the skin on your back. Notify your caregiver of new moles, moles that have irregular borders, moles that are larger than a pencil eraser, or moles that have changed in shape or color.   Stay current with required immunizations.   Influenza. You need a dose every fall (or winter). The composition of  the flu vaccine changes each year, so being vaccinated once is not enough.   Pneumococcal polysaccharide. You need 1 to 2 doses if you smoke cigarettes or if you have certain chronic medical conditions. You need 1 dose at age 65 (or older) if you have never been vaccinated.   Tetanus, diphtheria, pertussis (Tdap, Td). Get 1 dose of   Tdap vaccine if you are younger than age 65, are over 65 and have contact with an infant, are a healthcare worker, are pregnant, or simply want to be protected from whooping cough. After that, you need a Td booster dose every 10 years. Consult your caregiver if you have not had at least 3 tetanus and diphtheria-containing shots sometime in your life or have a deep or dirty wound.   HPV. You need this vaccine if you are a woman age 26 or younger. The vaccine is given in 3 doses over 6 months.   Measles, mumps, rubella (MMR). You need at least 1 dose of MMR if you were born in 1957 or later. You may also need a second dose.   Meningococcal. If you are age 19 to 21 and a first-year college student living in a residence hall, or have one of several medical conditions, you need to get vaccinated against meningococcal disease. You may also need additional booster doses.   Zoster (shingles). If you are age 60 or older, you should get this vaccine.   Varicella (chickenpox). If you have never had chickenpox or you were vaccinated but received only 1 dose, talk to your caregiver to find out if you need this vaccine.   Hepatitis A. You need this vaccine if you have a specific risk factor for hepatitis A virus infection or you simply wish to be protected from this disease. The vaccine is usually given as 2 doses, 6 to 18 months apart.   Hepatitis B. You need this vaccine if you have a specific risk factor for hepatitis B virus infection or you simply wish to be protected from this disease. The vaccine is given in 3 doses, usually over 6 months.  Preventive Services /  Frequency Ages 19 to 39  Blood pressure check.** / Every 1 to 2 years.   Lipid and cholesterol check.** / Every 5 years beginning at age 20.   Clinical breast exam.** / Every 3 years for women in their 20s and 30s.   Pap test.** / Every 2 years from ages 21 through 29. Every 3 years starting at age 30 through age 65 or 70 with a history of 3 consecutive normal Pap tests.   HPV screening.** / Every 3 years from ages 30 through ages 65 to 70 with a history of 3 consecutive normal Pap tests.   Hepatitis C blood test.** / For any individual with known risks for hepatitis C.   Skin self-exam. / Monthly.   Influenza immunization.** / Every year.   Pneumococcal polysaccharide immunization.** / 1 to 2 doses if you smoke cigarettes or if you have certain chronic medical conditions.   Tetanus, diphtheria, pertussis (Tdap, Td) immunization. / A one-time dose of Tdap vaccine. After that, you need a Td booster dose every 10 years.   HPV immunization. / 3 doses over 6 months, if you are 26 and younger.   Measles, mumps, rubella (MMR) immunization. / You need at least 1 dose of MMR if you were born in 1957 or later. You may also need a second dose.   Meningococcal immunization. / 1 dose if you are age 19 to 21 and a first-year college student living in a residence hall, or have one of several medical conditions, you need to get vaccinated against meningococcal disease. You may also need additional booster doses.   Varicella immunization.** / Consult your caregiver.   Hepatitis A immunization.** / Consult your caregiver. 2 doses, 6 to 18 months   apart.   Hepatitis B immunization.** / Consult your caregiver. 3 doses usually over 6 months.  Ages 40 to 64  Blood pressure check.** / Every 1 to 2 years.   Lipid and cholesterol check.** / Every 5 years beginning at age 20.   Clinical breast exam.** / Every year after age 40.   Mammogram.** / Every year beginning at age 40 and continuing for as  long as you are in good health. Consult with your caregiver.   Pap test.** / Every 3 years starting at age 30 through age 65 or 70 with a history of 3 consecutive normal Pap tests.   HPV screening.** / Every 3 years from ages 30 through ages 65 to 70 with a history of 3 consecutive normal Pap tests.   Fecal occult blood test (FOBT) of stool. / Every year beginning at age 50 and continuing until age 75. You may not need to do this test if you get a colonoscopy every 10 years.   Flexible sigmoidoscopy or colonoscopy.** / Every 5 years for a flexible sigmoidoscopy or every 10 years for a colonoscopy beginning at age 50 and continuing until age 75.   Hepatitis C blood test.** / For all people born from 1945 through 1965 and any individual with known risks for hepatitis C.   Skin self-exam. / Monthly.   Influenza immunization.** / Every year.   Pneumococcal polysaccharide immunization.** / 1 to 2 doses if you smoke cigarettes or if you have certain chronic medical conditions.   Tetanus, diphtheria, pertussis (Tdap, Td) immunization.** / A one-time dose of Tdap vaccine. After that, you need a Td booster dose every 10 years.   Measles, mumps, rubella (MMR) immunization. / You need at least 1 dose of MMR if you were born in 1957 or later. You may also need a second dose.   Varicella immunization.** / Consult your caregiver.   Meningococcal immunization.** / Consult your caregiver.   Hepatitis A immunization.** / Consult your caregiver. 2 doses, 6 to 18 months apart.   Hepatitis B immunization.** / Consult your caregiver. 3 doses, usually over 6 months.  Ages 65 and over  Blood pressure check.** / Every 1 to 2 years.   Lipid and cholesterol check.** / Every 5 years beginning at age 20.   Clinical breast exam.** / Every year after age 40.   Mammogram.** / Every year beginning at age 40 and continuing for as long as you are in good health. Consult with your caregiver.   Pap test.** /  Every 3 years starting at age 30 through age 65 or 70 with a 3 consecutive normal Pap tests. Testing can be stopped between 65 and 70 with 3 consecutive normal Pap tests and no abnormal Pap or HPV tests in the past 10 years.   HPV screening.** / Every 3 years from ages 30 through ages 65 or 70 with a history of 3 consecutive normal Pap tests. Testing can be stopped between 65 and 70 with 3 consecutive normal Pap tests and no abnormal Pap or HPV tests in the past 10 years.   Fecal occult blood test (FOBT) of stool. / Every year beginning at age 50 and continuing until age 75. You may not need to do this test if you get a colonoscopy every 10 years.   Flexible sigmoidoscopy or colonoscopy.** / Every 5 years for a flexible sigmoidoscopy or every 10 years for a colonoscopy beginning at age 50 and continuing until age 75.   Hepatitis   C blood test.** / For all people born from 1945 through 1965 and any individual with known risks for hepatitis C.   Osteoporosis screening.** / A one-time screening for women ages 65 and over and women at risk for fractures or osteoporosis.   Skin self-exam. / Monthly.   Influenza immunization.** / Every year.   Pneumococcal polysaccharide immunization.** / 1 dose at age 65 (or older) if you have never been vaccinated.   Tetanus, diphtheria, pertussis (Tdap, Td) immunization. / A one-time dose of Tdap vaccine if you are over 65 and have contact with an infant, are a healthcare worker, or simply want to be protected from whooping cough. After that, you need a Td booster dose every 10 years.   Varicella immunization.** / Consult your caregiver.   Meningococcal immunization.** / Consult your caregiver.   Hepatitis A immunization.** / Consult your caregiver. 2 doses, 6 to 18 months apart.   Hepatitis B immunization.** / Check with your caregiver. 3 doses, usually over 6 months.  ** Family history and personal history of risk and conditions may change your caregiver's  recommendations. Document Released: 01/08/2002 Document Revised: 11/01/2011 Document Reviewed: 04/09/2011 [employer] Patient Information 2012 [employer]. 

## 2012-02-14 NOTE — Assessment & Plan Note (Signed)
Check labs 

## 2012-03-18 ENCOUNTER — Ambulatory Visit (INDEPENDENT_AMBULATORY_CARE_PROVIDER_SITE_OTHER): Payer: 59 | Admitting: Internal Medicine

## 2012-03-18 ENCOUNTER — Encounter: Payer: Self-pay | Admitting: Internal Medicine

## 2012-03-18 VITALS — BP 112/71 | HR 84 | Ht 67.0 in | Wt 152.4 lb

## 2012-03-18 DIAGNOSIS — R002 Palpitations: Secondary | ICD-10-CM

## 2012-03-18 NOTE — Progress Notes (Signed)
History and Physical  Patient ID: Sherry Jacobs MRN: [id number], SOB: 15-Jan-1966 46 y.o. Date of Encounter: 03/18/2012, 10:52 AM  Primary Physician: Loreen Freud, DO, DO  Chief Complaint:PALPS  History of Present Illness: Sherry Jacobs is a 46 y.o. female whom I had seen him 2010 for palpitations with which an event recorder had identified no arrhythmia.  She continues to have palpitations aggravated by stress lack of sleep and/or caffeine. Electrocardiogram with her PCP identified a PAC.  She has no problems with exercise tolerance. She denies syncope. He has no problems with edema  Past Medical History  Diagnosis Date  . IBS (irritable bowel syndrome)   . Fibromyalgia   . Anxiety   . Tachycardia      Past Surgical History  Procedure Date  . Appendectomy 1981  . Brachial cyst 2005      Current Outpatient Prescriptions  Medication Sig Dispense Refill  . ALPRAZolam (XANAX) 0.25 MG tablet Take 0.25 mg by mouth as directed. 1-2 per month on average       . hydroxychloroquine (PLAQUENIL) 200 MG tablet Take by mouth daily.      . Multiple Vitamin (MULTIVITAMIN) tablet Take 1 tablet by mouth daily.        . propranolol (INDERAL) 20 MG tablet Take 20 mg by mouth as needed.        . tretinoin (RETIN-A) 0.025 % cream Apply topically at bedtime.           Allergies: Allergies  Allergen Reactions  . Sulfonamide Derivatives      History  Substance Use Topics  . Smoking status: Never Smoker   . Smokeless tobacco: Not on file  . Alcohol Use: No      Family History  Problem Relation Age of Onset  . Diabetes    . Hypertension    . Alcohol abuse    . Hyperlipidemia    . Arthritis    . Breast cancer Paternal Grandmother   . Stroke    . Thyroid disease    . Melanoma Maternal Grandfather   . Breast cancer Mother   . Uterine cancer        ROS:  Please see the history of present illness.    All other systems reviewed and negative.   Vital Signs: Blood  pressure 112/71, pulse 84, height 5\' 7"  (1.702 m), weight 152 lb 6.4 oz (69.128 kg).  PHYSICAL EXAM: General:  Well nourished, well developed female in no acute distress HEENT: normal Lymph: no adenopathy Neck: no JVD Endocrine:  No thryomegaly Vascular: No carotid bruits; FA pulses 2+ bilaterally without bruits Cardiac:  normal S1, S2; RRR; no murmur Back: without kyphosis/scoliosis, no CVA tenderness Lungs:  clear to auscultation bilaterally, no wheezing, rhonchi or rales Abd: soft, nontender, no hepatomegaly Ext: no edema Musculoskeletal:  No deformities, BUE and BLE strength normal and equal Skin: warm and dry Neuro:  CNs 2-12 intact, no focal abnormalities noted Psych:  Normal affect   EKG:  Reviewed from March 2013 identified a single PAC. Otherwise normal Labs:   Lab Results  Component Value Date   WBC 4.4* 03/14/2011   HGB 13.1 03/14/2011   HCT 38.5 03/14/2011   MCV 86.1 03/14/2011   PLT 181.0 03/14/2011   No results found for this basename: NA,K,CL,CO2,BUN,CREATININE,CALCIUM,LABALBU,PROT,BILITOT,ALKPHOS,ALT,AST,GLUCOSE in the last 168 hours No results found for this basename: CKTOTAL:4,CKMB:4,TROPONINI:4 in the last 72 hours Lab Results  Component Value Date   CHOL 197 01/18/2011  HDL 62.70 01/18/2011   LDLCALC 125* 01/18/2011   TRIG 48.0 01/18/2011   No results found for this basename: DDIMER   BNP No results found for this basename: probnp       ASSESSMENT AND PLAN:          Assessment and  Plan

## 2012-03-18 NOTE — Assessment & Plan Note (Signed)
Her palpitations seem to be premature beats. We reviewed PACs and PVCs and the ability to distinguish by palpation most of the time. Her ECG identified a PAC. In the absence of other symptoms, I don't think anything further needs to be done. She was largely reassured by this. We will see her again as needed We also discussed the use of when necessary beta blockers which she uses when she gets talks and has associated anxiety. I've told her that she could take the propranolol as needed issue at that particular bad day with palpitations

## 2012-03-21 ENCOUNTER — Ambulatory Visit (INDEPENDENT_AMBULATORY_CARE_PROVIDER_SITE_OTHER): Payer: 59 | Admitting: Family Medicine

## 2012-03-21 ENCOUNTER — Encounter: Payer: Self-pay | Admitting: Family Medicine

## 2012-03-21 VITALS — BP 102/74 | HR 87 | Temp 97.8°F | Wt 151.6 lb

## 2012-03-21 DIAGNOSIS — L03039 Cellulitis of unspecified toe: Secondary | ICD-10-CM

## 2012-03-21 MED ORDER — CEPHALEXIN 500 MG PO CAPS: 500.0000 mg | ORAL_CAPSULE | Freq: Two times a day (BID) | ORAL | Status: AC

## 2012-03-21 NOTE — Progress Notes (Signed)
  Subjective:    Patient ID: Sherry Jacobs, female    DOB: 1966/07/03, 46 y.o.   MRN: [id number]  HPI Toe nail problem- R 3rd toe, sxs started 2 weeks ago after running on the treadmill.  Toe just below nail bed became red, was throbbing.  This resolved overnight but then returned 4 days ago and has persisted.  No drainage or pain w/ weight bearing.   Review of Systems For ROS see HPI     Objective:   Physical Exam  Vitals reviewed. Constitutional: She appears well-developed and well-nourished. No distress.  Skin: Skin is warm and dry. There is erythema (over R 3rd toe at base of nail w/ some induration.  no fluctuance or visible pus.  no ingrown nail).          Assessment & Plan:

## 2012-03-21 NOTE — Patient Instructions (Signed)
This is a cellulitis (skin infection) Start the Keflex twice daily Warm soaks Call with any questions or concerns Hang in there!!!

## 2012-03-21 NOTE — Assessment & Plan Note (Signed)
New.  No evidence of ingrown nail or paronychia.  Start Keflex.  Reviewed supportive care and red flags that should prompt return.  Pt expressed understanding and is in agreement w/ plan.

## 2012-03-27 ENCOUNTER — Other Ambulatory Visit (INDEPENDENT_AMBULATORY_CARE_PROVIDER_SITE_OTHER): Payer: 59

## 2012-03-27 DIAGNOSIS — Z0000 Encounter for general adult medical examination without abnormal findings: Secondary | ICD-10-CM

## 2012-03-27 LAB — CBC WITH DIFFERENTIAL/PLATELET
Basophils Absolute: 0 10*3/uL (ref 0.0–0.1)
Basophils Relative: 0.5 % (ref 0.0–3.0)
Eosinophils Relative: 1.4 % (ref 0.0–5.0)
MCV: 85.4 fl (ref 78.0–100.0)
Monocytes Absolute: 0.4 10*3/uL (ref 0.1–1.0)
Monocytes Relative: 8.5 % (ref 3.0–12.0)
Neutrophils Relative %: 63.9 % (ref 43.0–77.0)
Platelets: 192 10*3/uL (ref 150.0–400.0)
RBC: 4.74 Mil/uL (ref 3.87–5.11)
WBC: 4.1 10*3/uL — ABNORMAL LOW (ref 4.5–10.5)

## 2012-03-27 LAB — LIPID PANEL
Cholesterol: 197 mg/dL (ref 0–200)
LDL Cholesterol: 119 mg/dL — ABNORMAL HIGH (ref 0–99)
Triglycerides: 48 mg/dL (ref 0.0–149.0)
VLDL: 9.6 mg/dL (ref 0.0–40.0)

## 2012-03-27 LAB — HEPATIC FUNCTION PANEL
ALT: 14 U/L (ref 0–35)
Albumin: 4.4 g/dL (ref 3.5–5.2)
Alkaline Phosphatase: 42 U/L (ref 39–117)
Total Protein: 7.5 g/dL (ref 6.0–8.3)

## 2012-03-27 LAB — TSH: TSH: 2.24 u[IU]/mL (ref 0.35–5.50)

## 2012-03-27 LAB — BASIC METABOLIC PANEL
Chloride: 105 mEq/L (ref 96–112)
Creatinine, Ser: 0.8 mg/dL (ref 0.4–1.2)
GFR: 78.27 mL/min (ref >60.00)
Potassium: 4.7 mEq/L (ref 3.5–5.1)

## 2012-04-01 ENCOUNTER — Encounter: Payer: Self-pay | Admitting: Family Medicine

## 2012-04-01 ENCOUNTER — Ambulatory Visit (INDEPENDENT_AMBULATORY_CARE_PROVIDER_SITE_OTHER): Payer: 59 | Admitting: Family Medicine

## 2012-04-01 VITALS — BP 100/60 | HR 83 | Temp 97.9°F | Wt 152.6 lb

## 2012-04-01 DIAGNOSIS — M79609 Pain in unspecified limb: Secondary | ICD-10-CM

## 2012-04-01 DIAGNOSIS — M79674 Pain in right toe(s): Secondary | ICD-10-CM

## 2012-04-01 NOTE — Progress Notes (Signed)
  Subjective:    Patient ID: Sherry Jacobs, female    DOB: [birth year], 46 y.o.   MRN: [id number]  HPI Pt here f/u infection 3rd right toe ---she finished abx yesterday and it is worse.  See last visit.   Review of Systems As above    Objective:   Physical Exam  Constitutional: She appears well-developed and well-nourished.  Musculoskeletal:       3rd r toe---  Red distal and hard to palpation,  No d/c           Nail yellow          Assessment & Plan:  3 rt toe pain------ no improvement with abx                           Refer to podiatry

## 2012-06-13 ENCOUNTER — Other Ambulatory Visit (HOSPITAL_COMMUNITY): Payer: Self-pay | Admitting: Obstetrics and Gynecology

## 2012-11-11 ENCOUNTER — Telehealth: Payer: Self-pay | Admitting: Family Medicine

## 2012-11-11 NOTE — Telephone Encounter (Signed)
agree

## 2012-11-11 NOTE — Telephone Encounter (Signed)
Patient Information:  Caller Name: Lyla  Phone: [phone]  Patient: Sherry Jacobs, Sherry Jacobs  Gender: Female  DOB: [birth year]  Age: 46 Years  PCP: Lelon Perla.  Pregnant: No  Office Follow Up:  Does the office need to follow up with this patient?: No  Instructions For The Office: N/A  RN Note:  Flu Like symptoms triage used per [facility] guidelines. Disposition was home care advice. Per protocol for symptomatic patient who has had close contact with family member with positive flu test ok to call in Tamiflu. Tamiflu 75mg  1 po BID x 5days called to [facility] outpatient pharmacy. Pt given care advice on transmission and staying hydrated, monitor temperature  Symptoms  Reason For Call & Symptoms: nasal congestion, cough, nausea, sore throat. Normal temp- 98.9  Daughter had positive flu test on yesterday, question about medication being called in for her.  Reviewed Health History In EMR: Yes  Reviewed Medications In EMR: Yes  Reviewed Allergies In EMR: Yes  Reviewed Surgeries / Procedures: Yes  Date of Onset of Symptoms: 11/11/2012 OB:  LMP: 10/23/2012  Guideline(s) Used:  No Protocol Available - Sick Adult  Disposition Per Guideline:   Home Care  Reason For Disposition Reached:   Patient's symptoms are safe to treat at home per nursing judgment  Advice Given:  N/A

## 2012-11-13 ENCOUNTER — Other Ambulatory Visit (HOSPITAL_COMMUNITY): Payer: Self-pay | Admitting: Obstetrics and Gynecology

## 2012-11-13 DIAGNOSIS — R922 Inconclusive mammogram: Secondary | ICD-10-CM

## 2012-11-14 ENCOUNTER — Encounter: Payer: Self-pay | Admitting: Family Medicine

## 2012-11-24 ENCOUNTER — Ambulatory Visit (HOSPITAL_COMMUNITY): Payer: 59

## 2012-11-24 ENCOUNTER — Ambulatory Visit (HOSPITAL_COMMUNITY)
Admission: RE | Admit: 2012-11-24 | Discharge: 2012-11-24 | Disposition: A | Discharge status: Home / Self Care | Payer: 59 | Source: Ambulatory Visit | Attending: Obstetrics and Gynecology | Admitting: Obstetrics and Gynecology

## 2012-11-24 ENCOUNTER — Other Ambulatory Visit (HOSPITAL_COMMUNITY): Payer: 59

## 2012-11-24 DIAGNOSIS — R922 Inconclusive mammogram: Secondary | ICD-10-CM | POA: Insufficient documentation

## 2012-11-24 DIAGNOSIS — Z923 Personal history of irradiation: Secondary | ICD-10-CM | POA: Insufficient documentation

## 2012-11-24 MED ORDER — GADOBENATE DIMEGLUMINE 529 MG/ML IV SOLN
15.0000 mL | Freq: Once | INTRAVENOUS | Status: AC | PRN
  Administered 2012-11-24: 13 mL via INTRAVENOUS

## 2013-01-10 ENCOUNTER — Other Ambulatory Visit: Payer: Self-pay

## 2013-02-16 ENCOUNTER — Encounter: Payer: 59 | Admitting: Family Medicine

## 2013-04-01 ENCOUNTER — Ambulatory Visit (INDEPENDENT_AMBULATORY_CARE_PROVIDER_SITE_OTHER): Payer: 59 | Admitting: Family Medicine

## 2013-04-01 ENCOUNTER — Encounter: Payer: Self-pay | Admitting: Family Medicine

## 2013-04-01 VITALS — BP 110/70 | HR 83 | Temp 98.3°F | Ht 67.0 in | Wt 158.2 lb

## 2013-04-01 DIAGNOSIS — E559 Vitamin D deficiency, unspecified: Secondary | ICD-10-CM

## 2013-04-01 DIAGNOSIS — E785 Hyperlipidemia, unspecified: Secondary | ICD-10-CM

## 2013-04-01 DIAGNOSIS — F411 Generalized anxiety disorder: Secondary | ICD-10-CM

## 2013-04-01 DIAGNOSIS — Z0000 Encounter for general adult medical examination without abnormal findings: Secondary | ICD-10-CM

## 2013-04-01 NOTE — Assessment & Plan Note (Signed)
Check labs 

## 2013-04-01 NOTE — Assessment & Plan Note (Signed)
Per psych 

## 2013-04-01 NOTE — Patient Instructions (Addendum)
Preventive Care for Adults, Female A healthy lifestyle and preventive care can promote health and wellness. Preventive health guidelines for women include the following key practices.  A routine yearly physical is a good way to check with your caregiver about your health and preventive screening. It is a chance to share any concerns and updates on your health, and to receive a thorough exam.  Visit your dentist for a routine exam and preventive care every 6 months. Brush your teeth twice a day and floss once a day. Good oral hygiene prevents tooth decay and gum disease.  The frequency of eye exams is based on your age, health, family medical history, use of contact lenses, and other factors. Follow your caregiver's recommendations for frequency of eye exams.  Eat a healthy diet. Foods like vegetables, fruits, whole grains, low-fat dairy products, and lean protein foods contain the nutrients you need without too many calories. Decrease your intake of foods high in solid fats, added sugars, and salt. Eat the right amount of calories for you.Get information about a proper diet from your caregiver, if necessary.  Regular physical exercise is one of the most important things you can do for your health. Most adults should get at least 150 minutes of moderate-intensity exercise (any activity that increases your heart rate and causes you to sweat) each week. In addition, most adults need muscle-strengthening exercises on 2 or more days a week.  Maintain a healthy weight. The body mass index (BMI) is a screening tool to identify possible weight problems. It provides an estimate of body fat based on height and weight. Your caregiver can help determine your BMI, and can help you achieve or maintain a healthy weight.For adults 20 years and older:  A BMI below 18.5 is considered underweight.  A BMI of 18.5 to 24.9 is normal.  A BMI of 25 to 29.9 is considered overweight.  A BMI of 30 and above is  considered obese.  Maintain normal blood lipids and cholesterol levels by exercising and minimizing your intake of saturated fat. Eat a balanced diet with plenty of fruit and vegetables. Blood tests for lipids and cholesterol should begin at age 20 and be repeated every 5 years. If your lipid or cholesterol levels are high, you are over 50, or you are at high risk for heart disease, you may need your cholesterol levels checked more frequently.Ongoing high lipid and cholesterol levels should be treated with medicines if diet and exercise are not effective.  If you smoke, find out from your caregiver how to quit. If you do not use tobacco, do not start.  If you are pregnant, do not drink alcohol. If you are breastfeeding, be very cautious about drinking alcohol. If you are not pregnant and choose to drink alcohol, do not exceed 1 drink per day. One drink is considered to be 12 ounces (355 mL) of beer, 5 ounces (148 mL) of wine, or 1.5 ounces (44 mL) of liquor.  Avoid use of street drugs. Do not share needles with anyone. Ask for help if you need support or instructions about stopping the use of drugs.  High blood pressure causes heart disease and increases the risk of stroke. Your blood pressure should be checked at least every 1 to 2 years. Ongoing high blood pressure should be treated with medicines if weight loss and exercise are not effective.  If you are 55 to 48 years old, ask your caregiver if you should take aspirin to prevent strokes.  Diabetes   screening involves taking a blood sample to check your fasting blood sugar level. This should be done once every 3 years, after age 45, if you are within normal weight and without risk factors for diabetes. Testing should be considered at a younger age or be carried out more frequently if you are overweight and have at least 1 risk factor for diabetes.  Breast cancer screening is essential preventive care for women. You should practice "breast  self-awareness." This means understanding the normal appearance and feel of your breasts and may include breast self-examination. Any changes detected, no matter how small, should be reported to a caregiver. Women in their 20s and 30s should have a clinical breast exam (CBE) by a caregiver as part of a regular health exam every 1 to 3 years. After age 40, women should have a CBE every year. Starting at age 40, women should consider having a mammography (breast X-ray test) every year. Women who have a family history of breast cancer should talk to their caregiver about genetic screening. Women at a high risk of breast cancer should talk to their caregivers about having magnetic resonance imaging (MRI) and a mammography every year.  The Pap test is a screening test for cervical cancer. A Pap test can show cell changes on the cervix that might become cervical cancer if left untreated. A Pap test is a procedure in which cells are obtained and examined from the lower end of the uterus (cervix).  Women should have a Pap test starting at age 21.  Between ages 21 and 29, Pap tests should be repeated every 2 years.  Beginning at age 30, you should have a Pap test every 3 years as long as the past 3 Pap tests have been normal.  Some women have medical problems that increase the chance of getting cervical cancer. Talk to your caregiver about these problems. It is especially important to talk to your caregiver if a new problem develops soon after your last Pap test. In these cases, your caregiver may recommend more frequent screening and Pap tests.  The above recommendations are the same for women who have or have not gotten the vaccine for human papillomavirus (HPV).  If you had a hysterectomy for a problem that was not cancer or a condition that could lead to cancer, then you no longer need Pap tests. Even if you no longer need a Pap test, a regular exam is a good idea to make sure no other problems are  starting.  If you are between ages 65 and 70, and you have had normal Pap tests going back 10 years, you no longer need Pap tests. Even if you no longer need a Pap test, a regular exam is a good idea to make sure no other problems are starting.  If you have had past treatment for cervical cancer or a condition that could lead to cancer, you need Pap tests and screening for cancer for at least 20 years after your treatment.  If Pap tests have been discontinued, risk factors (such as a new sexual partner) need to be reassessed to determine if screening should be resumed.  The HPV test is an additional test that may be used for cervical cancer screening. The HPV test looks for the virus that can cause the cell changes on the cervix. The cells collected during the Pap test can be tested for HPV. The HPV test could be used to screen women aged 30 years and older, and should   be used in women of any age who have unclear Pap test results. After the age of 30, women should have HPV testing at the same frequency as a Pap test.  Colorectal cancer can be detected and often prevented. Most routine colorectal cancer screening begins at the age of 50 and continues through age 75. However, your caregiver may recommend screening at an earlier age if you have risk factors for colon cancer. On a yearly basis, your caregiver may provide home test kits to check for hidden blood in the stool. Use of a small camera at the end of a tube, to directly examine the colon (sigmoidoscopy or colonoscopy), can detect the earliest forms of colorectal cancer. Talk to your caregiver about this at age 50, when routine screening begins. Direct examination of the colon should be repeated every 5 to 10 years through age 75, unless early forms of pre-cancerous polyps or small growths are found.  Hepatitis C blood testing is recommended for all people born from 1945 through 1965 and any individual with known risks for hepatitis C.  Practice  safe sex. Use condoms and avoid high-risk sexual practices to reduce the spread of sexually transmitted infections (STIs). STIs include gonorrhea, chlamydia, syphilis, trichomonas, herpes, HPV, and human immunodeficiency virus (HIV). Herpes, HIV, and HPV are viral illnesses that have no cure. They can result in disability, cancer, and death. Sexually active women aged 25 and younger should be checked for chlamydia. Older women with new or multiple partners should also be tested for chlamydia. Testing for other STIs is recommended if you are sexually active and at increased risk.  Osteoporosis is a disease in which the bones lose minerals and strength with aging. This can result in serious bone fractures. The risk of osteoporosis can be identified using a bone density scan. Women ages 65 and over and women at risk for fractures or osteoporosis should discuss screening with their caregivers. Ask your caregiver whether you should take a calcium supplement or vitamin D to reduce the rate of osteoporosis.  Menopause can be associated with physical symptoms and risks. Hormone replacement therapy is available to decrease symptoms and risks. You should talk to your caregiver about whether hormone replacement therapy is right for you.  Use sunscreen with sun protection factor (SPF) of 30 or more. Apply sunscreen liberally and repeatedly throughout the day. You should seek shade when your shadow is shorter than you. Protect yourself by wearing long sleeves, pants, a wide-brimmed hat, and sunglasses year round, whenever you are outdoors.  Once a month, do a whole body skin exam, using a mirror to look at the skin on your back. Notify your caregiver of new moles, moles that have irregular borders, moles that are larger than a pencil eraser, or moles that have changed in shape or color.  Stay current with required immunizations.  Influenza. You need a dose every fall (or winter). The composition of the flu vaccine  changes each year, so being vaccinated once is not enough.  Pneumococcal polysaccharide. You need 1 to 2 doses if you smoke cigarettes or if you have certain chronic medical conditions. You need 1 dose at age 65 (or older) if you have never been vaccinated.  Tetanus, diphtheria, pertussis (Tdap, Td). Get 1 dose of Tdap vaccine if you are younger than age 65, are over 65 and have contact with an infant, are a healthcare worker, are pregnant, or simply want to be protected from whooping cough. After that, you need a Td   booster dose every 10 years. Consult your caregiver if you have not had at least 3 tetanus and diphtheria-containing shots sometime in your life or have a deep or dirty wound.  HPV. You need this vaccine if you are a woman age 26 or younger. The vaccine is given in 3 doses over 6 months.  Measles, mumps, rubella (MMR). You need at least 1 dose of MMR if you were born in 1957 or later. You may also need a second dose.  Meningococcal. If you are age 19 to 21 and a first-year college student living in a residence hall, or have one of several medical conditions, you need to get vaccinated against meningococcal disease. You may also need additional booster doses.  Zoster (shingles). If you are age 60 or older, you should get this vaccine.  Varicella (chickenpox). If you have never had chickenpox or you were vaccinated but received only 1 dose, talk to your caregiver to find out if you need this vaccine.  Hepatitis A. You need this vaccine if you have a specific risk factor for hepatitis A virus infection or you simply wish to be protected from this disease. The vaccine is usually given as 2 doses, 6 to 18 months apart.  Hepatitis B. You need this vaccine if you have a specific risk factor for hepatitis B virus infection or you simply wish to be protected from this disease. The vaccine is given in 3 doses, usually over 6 months. Preventive Services / Frequency Ages 19 to 39  Blood  pressure check.** / Every 1 to 2 years.  Lipid and cholesterol check.** / Every 5 years beginning at age 20.  Clinical breast exam.** / Every 3 years for women in their 20s and 30s.  Pap test.** / Every 2 years from ages 21 through 29. Every 3 years starting at age 30 through age 65 or 70 with a history of 3 consecutive normal Pap tests.  HPV screening.** / Every 3 years from ages 30 through ages 65 to 70 with a history of 3 consecutive normal Pap tests.  Hepatitis C blood test.** / For any individual with known risks for hepatitis C.  Skin self-exam. / Monthly.  Influenza immunization.** / Every year.  Pneumococcal polysaccharide immunization.** / 1 to 2 doses if you smoke cigarettes or if you have certain chronic medical conditions.  Tetanus, diphtheria, pertussis (Tdap, Td) immunization. / A one-time dose of Tdap vaccine. After that, you need a Td booster dose every 10 years.  HPV immunization. / 3 doses over 6 months, if you are 26 and younger.  Measles, mumps, rubella (MMR) immunization. / You need at least 1 dose of MMR if you were born in 1957 or later. You may also need a second dose.  Meningococcal immunization. / 1 dose if you are age 19 to 21 and a first-year college student living in a residence hall, or have one of several medical conditions, you need to get vaccinated against meningococcal disease. You may also need additional booster doses.  Varicella immunization.** / Consult your caregiver.  Hepatitis A immunization.** / Consult your caregiver. 2 doses, 6 to 18 months apart.  Hepatitis B immunization.** / Consult your caregiver. 3 doses usually over 6 months. Ages 40 to 64  Blood pressure check.** / Every 1 to 2 years.  Lipid and cholesterol check.** / Every 5 years beginning at age 20.  Clinical breast exam.** / Every year after age 40.  Mammogram.** / Every year beginning at age 40   and continuing for as long as you are in good health. Consult with your  caregiver.  Pap test.** / Every 3 years starting at age 30 through age 65 or 70 with a history of 3 consecutive normal Pap tests.  HPV screening.** / Every 3 years from ages 30 through ages 65 to 70 with a history of 3 consecutive normal Pap tests.  Fecal occult blood test (FOBT) of stool. / Every year beginning at age 50 and continuing until age 75. You may not need to do this test if you get a colonoscopy every 10 years.  Flexible sigmoidoscopy or colonoscopy.** / Every 5 years for a flexible sigmoidoscopy or every 10 years for a colonoscopy beginning at age 50 and continuing until age 75.  Hepatitis C blood test.** / For all people born from 1945 through 1965 and any individual with known risks for hepatitis C.  Skin self-exam. / Monthly.  Influenza immunization.** / Every year.  Pneumococcal polysaccharide immunization.** / 1 to 2 doses if you smoke cigarettes or if you have certain chronic medical conditions.  Tetanus, diphtheria, pertussis (Tdap, Td) immunization.** / A one-time dose of Tdap vaccine. After that, you need a Td booster dose every 10 years.  Measles, mumps, rubella (MMR) immunization. / You need at least 1 dose of MMR if you were born in 1957 or later. You may also need a second dose.  Varicella immunization.** / Consult your caregiver.  Meningococcal immunization.** / Consult your caregiver.  Hepatitis A immunization.** / Consult your caregiver. 2 doses, 6 to 18 months apart.  Hepatitis B immunization.** / Consult your caregiver. 3 doses, usually over 6 months. Ages 65 and over  Blood pressure check.** / Every 1 to 2 years.  Lipid and cholesterol check.** / Every 5 years beginning at age 20.  Clinical breast exam.** / Every year after age 40.  Mammogram.** / Every year beginning at age 40 and continuing for as long as you are in good health. Consult with your caregiver.  Pap test.** / Every 3 years starting at age 30 through age 65 or 70 with a 3  consecutive normal Pap tests. Testing can be stopped between 65 and 70 with 3 consecutive normal Pap tests and no abnormal Pap or HPV tests in the past 10 years.  HPV screening.** / Every 3 years from ages 30 through ages 65 or 70 with a history of 3 consecutive normal Pap tests. Testing can be stopped between 65 and 70 with 3 consecutive normal Pap tests and no abnormal Pap or HPV tests in the past 10 years.  Fecal occult blood test (FOBT) of stool. / Every year beginning at age 50 and continuing until age 75. You may not need to do this test if you get a colonoscopy every 10 years.  Flexible sigmoidoscopy or colonoscopy.** / Every 5 years for a flexible sigmoidoscopy or every 10 years for a colonoscopy beginning at age 50 and continuing until age 75.  Hepatitis C blood test.** / For all people born from 1945 through 1965 and any individual with known risks for hepatitis C.  Osteoporosis screening.** / A one-time screening for women ages 65 and over and women at risk for fractures or osteoporosis.  Skin self-exam. / Monthly.  Influenza immunization.** / Every year.  Pneumococcal polysaccharide immunization.** / 1 dose at age 65 (or older) if you have never been vaccinated.  Tetanus, diphtheria, pertussis (Tdap, Td) immunization. / A one-time dose of Tdap vaccine if you are over   65 and have contact with an infant, are a healthcare worker, or simply want to be protected from whooping cough. After that, you need a Td booster dose every 10 years.  Varicella immunization.** / Consult your caregiver.  Meningococcal immunization.** / Consult your caregiver.  Hepatitis A immunization.** / Consult your caregiver. 2 doses, 6 to 18 months apart.  Hepatitis B immunization.** / Check with your caregiver. 3 doses, usually over 6 months. ** Family history and personal history of risk and conditions may change your caregiver's recommendations. Document Released: 01/08/2002 Document Revised: 02/04/2012  Document Reviewed: 04/09/2011 [employer] Patient Information 2013 [employer].  

## 2013-04-01 NOTE — Progress Notes (Signed)
Subjective:     Sherry Jacobs is a 47 y.o. female and is here for a comprehensive physical exam. The patient reports no problems.  History   Social History  . Marital Status: Married    Spouse Name: N/A    Number of Children: N/A  . Years of Education: N/A   Occupational History  . Not on file.   Social History Main Topics  . Smoking status: Never Smoker   . Smokeless tobacco: Not on file  . Alcohol Use: No  . Drug Use: No  . Sexually Active: Yes -- Female partner(s)   Other Topics Concern  . 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 Influenza Vaccine  07/27/2013  . Mammogram  09/11/2013  . Pap Smear  10/16/2014  . Tetanus/tdap  12/15/2018    The following portions of the patient's history were reviewed and updated as appropriate: allergies, current medications, past family history, past medical history, past social history, past surgical history and problem list.  Review of Systems Review of Systems  Constitutional: Negative for activity change, appetite change and fatigue.  HENT: Negative for hearing loss, congestion, tinnitus and ear discharge.  dentist q36m Eyes: Negative for visual disturbance (see optho q1y -- vision corrected to 20/20 with glasses).  Respiratory: Negative for cough, chest tightness and shortness of breath.   Cardiovascular: Negative for chest pain, palpitations and leg swelling.  Gastrointestinal: Negative for abdominal pain, diarrhea, constipation and abdominal distention.  Genitourinary: Negative for urgency, frequency, decreased urine volume and difficulty urinating.  Musculoskeletal: Negative for back pain, arthralgias and gait problem.  Skin: Negative for color change, pallor and rash.  Neurological: Negative for dizziness, light-headedness, numbness and headaches.  Hematological: Negative for adenopathy. Does not bruise/bleed easily.   Psychiatric/Behavioral: Negative for suicidal ideas, confusion, sleep disturbance, self-injury, dysphoric mood, decreased concentration and agitation.       Objective:    BP 110/70  Pulse 83  Temp(Src) 98.3 F (36.8 C) (Oral)  Ht 5\' 7"  (1.702 m)  Wt 158 lb 3.2 oz (71.759 kg)  BMI 24.77 kg/m2  SpO2 99% General appearance: alert, cooperative, appears stated age and no distress Head: Normocephalic, without obvious abnormality, atraumatic Eyes: conjunctivae/corneas clear. PERRL, EOM's intact. Fundi benign. Ears: normal TM's and external ear canals both ears Nose: Nares normal. Septum midline. Mucosa normal. No drainage or sinus tenderness. Throat: lips, mucosa, and tongue normal; teeth and gums normal Neck: no adenopathy, no carotid bruit, no JVD, supple, symmetrical, trachea midline and thyroid not enlarged, symmetric, no tenderness/mass/nodules Back: symmetric, no curvature. ROM normal. No CVA tenderness. Lungs: clear to auscultation bilaterally Breasts: gyn Heart: regular rate and rhythm, S1, S2 normal, no murmur, click, rub or gallop Abdomen: soft, non-tender; bowel sounds normal; no masses,  no organomegaly Pelvic: deferred--gyn Extremities: extremities normal, atraumatic, no cyanosis or edema Pulses: 2+ and symmetric Skin: Skin color, texture, turgor normal. No rashes or lesions Lymph nodes: Cervical, supraclavicular, and axillary nodes normal. Neurologic: Alert and oriented X 3, normal strength and tone. Normal symmetric reflexes. Normal coordination and gait Psych-- no depression, no anxiety      Assessment:    Healthy female exam.      Plan:    ghm utd Check labs See After Visit Summary for Counseling  Recommendations

## 2013-06-11 ENCOUNTER — Ambulatory Visit (INDEPENDENT_AMBULATORY_CARE_PROVIDER_SITE_OTHER): Payer: 59 | Admitting: Family Medicine

## 2013-06-11 ENCOUNTER — Encounter: Payer: Self-pay | Admitting: Family Medicine

## 2013-06-11 ENCOUNTER — Telehealth: Payer: Self-pay | Admitting: Family Medicine

## 2013-06-11 VITALS — BP 100/68 | HR 72 | Temp 98.1°F | Wt 161.6 lb

## 2013-06-11 DIAGNOSIS — R350 Frequency of micturition: Secondary | ICD-10-CM

## 2013-06-11 LAB — POCT URINALYSIS DIPSTICK
Bilirubin, UA: NEGATIVE
Glucose, UA: NEGATIVE
Nitrite, UA: NEGATIVE
Urobilinogen, UA: 0.2

## 2013-06-11 MED ORDER — CIPROFLOXACIN HCL 500 MG PO TABS: 500.0000 mg | ORAL_TABLET | Freq: Two times a day (BID) | ORAL | Status: AC

## 2013-06-11 NOTE — Telephone Encounter (Signed)
Appointment Scheduled:  06/11/2013 11:30:00  

## 2013-06-11 NOTE — Patient Instructions (Signed)
Urinary Tract Infection  Urinary tract infections (UTIs) can develop anywhere along your urinary tract. Your urinary tract is your body's drainage system for removing wastes and extra water. Your urinary tract includes two kidneys, two ureters, a bladder, and a urethra. Your kidneys are a pair of bean-shaped organs. Each kidney is about the size of your fist. They are located below your ribs, one on each side of your spine.  CAUSES  Infections are caused by microbes, which are microscopic organisms, including fungi, viruses, and bacteria. These organisms are so small that they can only be seen through a microscope. Bacteria are the microbes that most commonly cause UTIs.  SYMPTOMS   Symptoms of UTIs may vary by age and gender of the patient and by the location of the infection. Symptoms in young women typically include a frequent and intense urge to urinate and a painful, burning feeling in the bladder or urethra during urination. Older women and men are more likely to be tired, shaky, and weak and have muscle aches and abdominal pain. A fever may mean the infection is in your kidneys. Other symptoms of a kidney infection include pain in your back or sides below the ribs, nausea, and vomiting.  DIAGNOSIS  To diagnose a UTI, your caregiver will ask you about your symptoms. Your caregiver also will ask to provide a urine sample. The urine sample will be tested for bacteria and white blood cells. White blood cells are made by your body to help fight infection.  TREATMENT   Typically, UTIs can be treated with medication. Because most UTIs are caused by a bacterial infection, they usually can be treated with the use of antibiotics. The choice of antibiotic and length of treatment depend on your symptoms and the type of bacteria causing your infection.  HOME CARE INSTRUCTIONS   If you were prescribed antibiotics, take them exactly as your caregiver instructs you. Finish the medication even if you feel better after you  have only taken some of the medication.   Drink enough water and fluids to keep your urine clear or pale yellow.   Avoid caffeine, tea, and carbonated beverages. They tend to irritate your bladder.   Empty your bladder often. Avoid holding urine for long periods of time.   Empty your bladder before and after sexual intercourse.   After a bowel movement, women should cleanse from front to back. Use each tissue only once.  SEEK MEDICAL CARE IF:    You have back pain.   You develop a fever.   Your symptoms do not begin to resolve within 3 days.  SEEK IMMEDIATE MEDICAL CARE IF:    You have severe back pain or lower abdominal pain.   You develop chills.   You have nausea or vomiting.   You have continued burning or discomfort with urination.  MAKE SURE YOU:    Understand these instructions.   Will watch your condition.   Will get help right away if you are not doing well or get worse.  Document Released: 08/22/2005 Document Revised: 05/13/2012 Document Reviewed: 12/21/2011  ExitCare Patient Information 2014 [employer].

## 2013-06-11 NOTE — Telephone Encounter (Signed)
Saw pt today

## 2013-06-11 NOTE — Telephone Encounter (Signed)
Patient Information:  Caller Name: Shamika  Phone: [phone]  Patient: Sherry Jacobs, Sherry Jacobs  Gender: Female  DOB: 1966/02/15  Age: 47 Years  PCP: Lelon Perla.  Pregnant: No  Office Follow Up:  Does the office need to follow up with this patient?: No  Instructions For The Office: N/A  RN Note:  Husb/vasectomy. Leaving for [place] 06/11/13-06/13/13. Low back ache present.   Symptoms  Reason For Call & Symptoms: Suspected UTI with urinary frequency, mild dysuria at start of stream and bladder discomfort.  Reviewed Health History In EMR: Yes  Reviewed Medications In EMR: Yes  Reviewed Allergies In EMR: Yes  Reviewed Surgeries / Procedures: Yes  Date of Onset of Symptoms: 06/10/2013  Treatments Tried: > water intake  Treatments Tried Worked: No OB / GYN:  LMP: 05/05/2013  Guideline(s) Used:  Urination Pain - Female  Disposition Per Guideline:   Go to Office Now  Reason For Disposition Reached:   Side (flank) or lower back pain present  Advice Given:  Fluids:   Drink extra fluids. Drink 8-10 glasses of liquids a day (Reason: to produce a dilute, non-irritating urine).  Cranberry Juice:   Dosage Cranberry Juice Cocktail: 8 oz (240 ml) twice a day.  Dosage 100% Cranberry Juice: 1 oz (30 ml) twice a day.  Warm Saline SITZ Baths to Reduce Pain:  Sit in a warm saline bath for 20 minutes to cleanse the area and to reduce pain. Add 2 oz. of table salt or baking soda to a tub of water.  Call Back If:  You become worse.  Patient Will Follow Care Advice:  YES  Appointment Scheduled:  06/11/2013 11:30:00 Appointment Scheduled Provider:  Lelon Perla.

## 2013-06-11 NOTE — Progress Notes (Signed)
  Subjective:    Sherry Jacobs is a 47 y.o. female who complains of frequency and pain in the lower abdomen. She has had symptoms for 2 days. Patient also complains of na. Patient denies congestion, cough, fever, headache, rhinitis, sorethroat, stomach ache and vaginal discharge. Patient does not have a history of recurrent UTI. Patient does not have a history of pyelonephritis.   The following portions of the patient's history were reviewed and updated as appropriate: allergies, current medications, past family history, past medical history, past social history, past surgical history and problem list.  Review of Systems Pertinent items are noted in HPI.    Objective:    BP 100/68  Pulse 72  Temp(Src) 98.1 F (36.7 C) (Oral)  Wt 161 lb 9.6 oz (73.301 kg)  BMI 25.3 kg/m2  SpO2 98% General appearance: alert, cooperative, appears stated age and no distress Back: no tenderness to percussion or palpation Abdomen: abnormal findings:  moderate tenderness in the lower abdomen  Laboratory:  Urine dipstick: mod for hemoglobin and trace for leukocyte esterase.   Micro exam: not done.    Assessment:    Acute cystitis and UTI     Plan:    Medications: ciprofloxacin. Maintain adequate hydration. Follow up if symptoms not improving, and as needed.

## 2013-06-13 LAB — URINE CULTURE: Organism ID, Bacteria: NO GROWTH

## 2013-07-31 ENCOUNTER — Other Ambulatory Visit (INDEPENDENT_AMBULATORY_CARE_PROVIDER_SITE_OTHER): Payer: 59

## 2013-07-31 DIAGNOSIS — Z0000 Encounter for general adult medical examination without abnormal findings: Secondary | ICD-10-CM

## 2013-07-31 DIAGNOSIS — E559 Vitamin D deficiency, unspecified: Secondary | ICD-10-CM

## 2013-07-31 DIAGNOSIS — E785 Hyperlipidemia, unspecified: Secondary | ICD-10-CM

## 2013-07-31 LAB — HEPATIC FUNCTION PANEL
ALT: 14 U/L (ref 0–35)
Alkaline Phosphatase: 43 U/L (ref 39–117)
Bilirubin, Direct: 0 mg/dL (ref 0.0–0.3)
Total Bilirubin: 0.7 mg/dL (ref 0.3–1.2)
Total Protein: 6.9 g/dL (ref 6.0–8.3)

## 2013-07-31 LAB — CBC WITH DIFFERENTIAL/PLATELET
Basophils Absolute: 0 10*3/uL (ref 0.0–0.1)
Eosinophils Absolute: 0 10*3/uL (ref 0.0–0.7)
HCT: 37.7 % (ref 36.0–46.0)
Lymphs Abs: 1.3 10*3/uL (ref 0.7–4.0)
MCV: 82.5 fl (ref 78.0–100.0)
Monocytes Absolute: 0.5 10*3/uL (ref 0.1–1.0)
Neutrophils Relative %: 58.1 % (ref 43.0–77.0)
Platelets: 223 10*3/uL (ref 150.0–400.0)
RDW: 14.4 % (ref 11.5–14.6)

## 2013-07-31 LAB — BASIC METABOLIC PANEL
BUN: 14 mg/dL (ref 6–23)
Chloride: 104 mEq/L (ref 96–112)
Creatinine, Ser: 0.8 mg/dL (ref 0.4–1.2)
Glucose, Bld: 85 mg/dL (ref 70–99)
Potassium: 4.4 mEq/L (ref 3.5–5.1)

## 2013-07-31 LAB — TSH: TSH: 2.18 u[IU]/mL (ref 0.35–5.50)

## 2013-07-31 LAB — LIPID PANEL
HDL: 66.5 mg/dL (ref >39.00)
VLDL: 10.6 mg/dL (ref 0.0–40.0)

## 2013-08-03 LAB — VITAMIN D 1,25 DIHYDROXY
Vitamin D 1, 25 (OH)2 Total: 55 pg/mL (ref 18–72)
Vitamin D3 1, 25 (OH)2: 55 pg/mL

## 2013-08-04 ENCOUNTER — Ambulatory Visit (INDEPENDENT_AMBULATORY_CARE_PROVIDER_SITE_OTHER): Payer: 59 | Admitting: Family Medicine

## 2013-08-04 ENCOUNTER — Encounter: Payer: Self-pay | Admitting: Family Medicine

## 2013-08-04 VITALS — BP 114/71 | HR 81 | Ht 67.0 in | Wt 160.0 lb

## 2013-08-04 DIAGNOSIS — M25571 Pain in right ankle and joints of right foot: Secondary | ICD-10-CM

## 2013-08-04 DIAGNOSIS — M25579 Pain in unspecified ankle and joints of unspecified foot: Secondary | ICD-10-CM

## 2013-08-04 NOTE — Patient Instructions (Addendum)
You have a stress reaction of the distal fibula (precursor to a stress fracture). Rest from running for 2 weeks. Ok for recumbent bike, swimming if these are not painful. Icing 15 minutes at a time as needed. Ibuprofen or aleve as needed for pain. Consider dr. Jari Sportsman active series insoles (though with your normal arch structure I don't think these are necessary). Follow up with me in 2 weeks for reevaluation.

## 2013-08-05 ENCOUNTER — Encounter: Payer: Self-pay | Admitting: Family Medicine

## 2013-08-05 DIAGNOSIS — M25571 Pain in right ankle and joints of right foot: Secondary | ICD-10-CM | POA: Insufficient documentation

## 2013-08-05 NOTE — Progress Notes (Signed)
Patient ID: Sherry Jacobs, female   DOB: January 23, 1966, 47 y.o.   MRN: [id number]  PCP: Loreen Freud, DO  Subjective:   HPI: Patient is a 47 y.o. female here for right ankle pain.  Patient started running school at [employer] - in 6th week. States pain started in right lateral ankle during run on Thursday. Ran 8 miles, walked 2 miles for 3 sets. Was also sore after a run on Saturday. Difficulty bearing weight Sunday due to pain. Has been icing since then. Was swollen initially but no bruising. No prior h/o stress fracture.  Past Medical History  Diagnosis Date  . IBS (irritable bowel syndrome)   . Fibromyalgia   . Anxiety   . Palpitations     PACs documented  . 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 ALPRAZolam (XANAX) 0.25 MG tablet Take 0.25 mg by mouth as directed. 1-2 per month on average       . hydroxychloroquine (PLAQUENIL) 200 MG tablet Take 200 mg by mouth 2 (two) times daily.       . propranolol (INDERAL) 20 MG tablet Take 20 mg by mouth as needed.        . lidocaine (LIDODERM) 5 % Place 1 patch onto the skin daily. Remove & Discard patch within 12 hours or as directed by MD Deveschwar      . Multiple Vitamin (MULTIVITAMIN) tablet Take 1 tablet by mouth daily.        Marland Kitchen tretinoin (RETIN-A) 0.025 % cream Apply topically at bedtime.         No current facility-administered medications on file prior to visit.    Past Surgical History  Procedure Laterality Date  . Appendectomy  1981  . Brachial cyst  2005    Allergies  Allergen Reactions  . Sulfonamide Derivatives     History   Social History  . Marital Status: Married    Spouse Name: N/A    Number of Children: N/A  . Years of Education: N/A   Occupational History  . Not on file.   Social History Main Topics  . Smoking status: Never Smoker   . Smokeless tobacco: Not on file  . Alcohol Use: No  . Drug Use: No   . Sexual Activity: Yes    Partners: Male   Other Topics Concern  . 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 Diabetes    . Hypertension    . Alcohol abuse    . Hyperlipidemia    . Arthritis    . Breast cancer Paternal Grandmother   . Stroke    . Thyroid disease    . Melanoma Maternal Grandfather   . Breast cancer Mother   . Uterine cancer      BP 114/71  Pulse 81  Ht 5\' 7"  (1.702 m)  Wt 160 lb (72.576 kg)  BMI 25.05 kg/m2  Review of Systems: See HPI above.    Objective:  Physical Exam:  Gen: NAD  Right ankle: No gross deformity, swelling, ecchymoses FROM ankle without pain, 5/5 strength. TTP distal fibula focally.  No other tenderness of foot or ankle. Negative ant drawer  and talar tilt.   Negative syndesmotic compression. Thompsons test negative. NV intact distally. Pain with hop test. Long arches normal.  msk u/s:  Right ankle - no evidence cortical irregularity, increased neovascularity, edema over cortex to suggest stress fracture   Assessment & Plan:  1. Right ankle pain - focal bony pain with running, normal ultrasound consistent with stress reaction.  No running for 2 weeks - can cross train with biking, swimming if not painful.  Icing, nsaids or tylenol.  Consider comforthotics, OTC orthotics.  F/u in 2 weeks for reevaluation.

## 2013-08-05 NOTE — Assessment & Plan Note (Signed)
focal bony pain with running, normal ultrasound consistent with stress reaction.  No running for 2 weeks - can cross train with biking, swimming if not painful.  Icing, nsaids or tylenol.  Consider comforthotics, OTC orthotics.  F/u in 2 weeks for reevaluation.

## 2013-08-17 ENCOUNTER — Encounter: Payer: Self-pay | Admitting: Internal Medicine

## 2013-08-17 ENCOUNTER — Ambulatory Visit (INDEPENDENT_AMBULATORY_CARE_PROVIDER_SITE_OTHER): Payer: 59 | Admitting: Internal Medicine

## 2013-08-17 VITALS — BP 110/82 | HR 88 | Temp 98.8°F | Wt 161.8 lb

## 2013-08-17 DIAGNOSIS — J309 Allergic rhinitis, unspecified: Secondary | ICD-10-CM

## 2013-08-17 DIAGNOSIS — J020 Streptococcal pharyngitis: Secondary | ICD-10-CM

## 2013-08-17 LAB — POCT RAPID STREP A (OFFICE): Rapid Strep A Screen: NEGATIVE

## 2013-08-17 MED ORDER — PREDNISONE 10 MG PO TABS: ORAL_TABLET | ORAL | Status: DC

## 2013-08-17 NOTE — Progress Notes (Signed)
HPI  Pt presents to the clinic today with c/o headache, fatigue, sore throat, and dry cough x 3 days. Her symptoms seem to be worse at night. She has not taken anything OTC. She denies fever, chills or body aches. She has no history of allergies or asthma. She has had sick contact.   Review of Systems    Past Medical History  Diagnosis Date  . IBS (irritable bowel syndrome)   . Fibromyalgia   . Anxiety   . Palpitations     PACs documented  . 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 Diabetes    . Hypertension    . Alcohol abuse    . Hyperlipidemia    . Arthritis    . Breast cancer Paternal Grandmother   . Stroke    . Thyroid disease    . Melanoma Maternal Grandfather   . Breast cancer Mother   . Uterine cancer      History   Social History  . Marital Status: Married    Spouse Name: N/A    Number of Children: N/A  . Years of Education: N/A   Occupational History  . Not on file.   Social History Main Topics  . Smoking status: Never Smoker   . Smokeless tobacco: Not on file  . Alcohol Use: No  . Drug Use: No  . Sexual Activity: Yes    Partners: Male   Other Topics Concern  . 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 Sulfonamide Derivatives      Constitutional: Positive headache, fatigue. Denies fever or abrupt weight changes.  HEENT:  Positive nasal congestion and sore throat. Denies eye redness, ear pain, ringing in the ears, wax buildup, runny nose or bloody nose. Respiratory: Positive cough. Denies difficulty breathing or shortness of breath.  Cardiovascular: Denies chest pain, chest tightness, palpitations or swelling in the hands or feet.   No other specific complaints in a complete review of systems (except as listed in HPI above).  Objective:    BP 110/82  Pulse 88  Temp(Src) 98.8 F (37.1  C) (Oral)  Wt 161 lb 12 oz (73.369 kg)  BMI 25.33 kg/m2  SpO2 98% Wt Readings from Last 3 Encounters:  08/17/13 161 lb 12 oz (73.369 kg)  08/04/13 160 lb (72.576 kg)  06/11/13 161 lb 9.6 oz (73.301 kg)    General: Appears her stated age, well developed, well nourished in NAD. HEENT: Head: normal shape and size; Eyes: sclera white, no icterus, conjunctiva pink, PERRLA and EOMs intact; Ears: Tm's gray and intact, normal light reflex; Nose: mucosa pink and moist, septum midline; Throat/Mouth: + PND. Teeth present, mucosa pink and moist, no exudate noted, no lesions or ulcerations noted.  Neck: Neck supple, trachea midline. No massses, lumps or thyromegaly present.  Cardiovascular: Normal rate and rhythm. S1,S2 noted.  No murmur, rubs or gallops noted. No JVD or BLE edema. No carotid bruits noted. Pulmonary/Chest: Normal effort and positive vesicular breath sounds. No respiratory distress. No wheezes, rales or ronchi noted.      Assessment & Plan:   Allergic Rhinitis  OTC zyrtec x 2 weeks Pt declined depo injection today eRx for pred taper RST-  negative  RTC as needed or if symptoms persist.

## 2013-08-17 NOTE — Patient Instructions (Signed)
Allergic Rhinitis Allergic rhinitis is when the mucous membranes in the nose respond to allergens. Allergens are particles in the air that cause your body to have an allergic reaction. This causes you to release allergic antibodies. Through a chain of events, these eventually cause you to release histamine into the blood stream (hence the use of antihistamines). Although meant to be protective to the body, it is this release that causes your discomfort, such as frequent sneezing, congestion and an itchy runny nose.  CAUSES  The pollen allergens may come from grasses, trees, and weeds. This is seasonal allergic rhinitis, or "hay fever." Other allergens cause year-round allergic rhinitis (perennial allergic rhinitis) such as house dust mite allergen, pet dander and mold spores.  SYMPTOMS   Nasal stuffiness (congestion).  Runny, itchy nose with sneezing and tearing of the eyes.  There is often an itching of the mouth, eyes and ears. It cannot be cured, but it can be controlled with medications. DIAGNOSIS  If you are unable to determine the offending allergen, skin or blood testing may find it. TREATMENT   Avoid the allergen.  Medications and allergy shots (immunotherapy) can help.  Hay fever may often be treated with antihistamines in pill or nasal spray forms. Antihistamines block the effects of histamine. There are over-the-counter medicines that may help with nasal congestion and swelling around the eyes. Check with your caregiver before taking or giving this medicine. If the treatment above does not work, there are many new medications your caregiver can prescribe. Stronger medications may be used if initial measures are ineffective. Desensitizing injections can be used if medications and avoidance fails. Desensitization is when a patient is given ongoing shots until the body becomes less sensitive to the allergen. Make sure you follow up with your caregiver if problems continue. SEEK MEDICAL  CARE IF:   You develop fever (more than 100.5 F (38.1 C).  You develop a cough that does not stop easily (persistent).  You have shortness of breath.  You start wheezing.  Symptoms interfere with normal daily activities. Document Released: 08/07/2001 Document Revised: 02/04/2012 Document Reviewed: 02/16/2009 Exit[employer] Patient Information 2014 [employer].  

## 2013-08-18 ENCOUNTER — Ambulatory Visit (INDEPENDENT_AMBULATORY_CARE_PROVIDER_SITE_OTHER): Payer: 59 | Admitting: Family Medicine

## 2013-08-18 ENCOUNTER — Encounter: Payer: Self-pay | Admitting: Family Medicine

## 2013-08-18 VITALS — BP 103/69 | HR 81 | Ht 67.0 in | Wt 159.0 lb

## 2013-08-18 DIAGNOSIS — M25571 Pain in right ankle and joints of right foot: Secondary | ICD-10-CM

## 2013-08-18 DIAGNOSIS — M25579 Pain in unspecified ankle and joints of unspecified foot: Secondary | ICD-10-CM

## 2013-08-18 NOTE — Patient Instructions (Addendum)
Your ultrasound is again reassuring. This should take 1-4 weeks before you can get back to running. When no pain on the bone with walking normally you can start walking for exercise. When no pain with walking for exercise can start a walk:jog program 1:1 for total of 10 minutes.  Every other day increase jog time by 1 minute, total exercise time by 5 minutes (so 2:1 jog: walk for 15, then 3:1 ZOX:WRUE for 20, etc). Ice after exercise 15 minutes. Tylenol or aleve as needed for pain. Can consider the long aircast, MRI, Dr. Jari Sportsman active series inserts if you're still struggling. Follow up as needed otherwise.

## 2013-08-19 ENCOUNTER — Encounter: Payer: Self-pay | Admitting: Family Medicine

## 2013-08-19 NOTE — Progress Notes (Signed)
Patient ID: Sherry Jacobs, female   DOB: 1966/09/09, 47 y.o.   MRN: [id number]  PCP: Loreen Freud, DO  Subjective:   HPI: Patient is a 47 y.o. female here for right ankle pain.  9/9: Patient started running school at [facility] - in 6th week. States pain started in right lateral ankle during run on Thursday. Ran 8 miles, walked 2 miles for 3 sets. Was also sore after a run on Saturday. Difficulty bearing weight Sunday due to pain. Has been icing since then. Was swollen initially but no bruising. No prior h/o stress fracture.  9/23: Patient reports she has improved though still has some pain with walking, pushing off especially. Pain goes to back of achilles. No swelling, bruising. Has not run though cycled twice and pain worsened with this. No new injuries.  Past Medical History  Diagnosis Date  . IBS (irritable bowel syndrome)   . Fibromyalgia   . Anxiety   . Palpitations     PACs documented  . 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 ALPRAZolam (XANAX) 0.25 MG tablet Take 0.25 mg by mouth as directed. 1-2 per month on average       . hydroxychloroquine (PLAQUENIL) 200 MG tablet Take 200 mg by mouth 2 (two) times daily.       Marland Kitchen lidocaine (LIDODERM) 5 % Place 1 patch onto the skin daily. Remove & Discard patch within 12 hours or as directed by MD Deveschwar      . Multiple Vitamin (MULTIVITAMIN) tablet Take 1 tablet by mouth daily.        . predniSONE (DELTASONE) 10 MG tablet Take 3 tablets on days 1-2, take 2 tablets on days 3-4, take 1 tablet on days 5-6  12 tablet  0  . propranolol (INDERAL) 20 MG tablet Take 20 mg by mouth as needed.        . tretinoin (RETIN-A) 0.025 % cream Apply topically at bedtime.         No current facility-administered medications on file prior to visit.    Past Surgical History  Procedure Laterality Date  . Appendectomy  1981  . Brachial cyst  2005     Allergies  Allergen Reactions  . Sulfonamide Derivatives     History   Social History  . Marital Status: Married    Spouse Name: N/A    Number of Children: N/A  . Years of Education: N/A   Occupational History  . Not on file.   Social History Main Topics  . Smoking status: Never Smoker   . Smokeless tobacco: Not on file  . Alcohol Use: No  . Drug Use: No  . Sexual Activity: Yes    Partners: Male   Other Topics Concern  . 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 Diabetes    . Hypertension    . Alcohol abuse    . Hyperlipidemia    . Arthritis    . Breast cancer Paternal Grandmother   . Stroke    . Thyroid disease    . Melanoma Maternal Grandfather   . Breast cancer Mother   . Uterine cancer  BP 103/69  Pulse 81  Ht 5\' 7"  (1.702 m)  Wt 159 lb (72.122 kg)  BMI 24.9 kg/m2  Review of Systems: See HPI above.    Objective:  Physical Exam:  Gen: NAD  Right ankle: No gross deformity, swelling, ecchymoses FROM ankle without pain, 5/5 strength. TTP distal fibula focally.  No other tenderness of foot or ankle. Negative ant drawer and talar tilt.   Negative syndesmotic compression. Thompsons test negative. NV intact distally. Pain with hop test. Long arches normal.  msk u/s:  Right ankle - no evidence cortical irregularity, increased neovascularity, edema over cortex to suggest stress fracture   Assessment & Plan:  1. Right ankle pain - focal bony pain with running, normal ultrasound consistent with stress reaction.  No abnormalities on repeat u/s today though still with bony tenderness.  Continue to rest from running - expect to return to walk:jog program when no pain with walking and rest sometime over next 1-4 weeks.  Icing, nsaids/tylenol as needed.  Consider long aircast, inserts if struggling still.

## 2013-08-19 NOTE — Assessment & Plan Note (Signed)
focal bony pain with running, normal ultrasound consistent with stress reaction.  No abnormalities on repeat u/s today though still with bony tenderness.  Continue to rest from running - expect to return to walk:jog program when no pain with walking and rest sometime over next 1-4 weeks.  Icing, nsaids/tylenol as needed.  Consider long aircast, inserts if struggling still.

## 2013-08-27 ENCOUNTER — Ambulatory Visit (INDEPENDENT_AMBULATORY_CARE_PROVIDER_SITE_OTHER): Payer: 59 | Admitting: Family Medicine

## 2013-08-27 ENCOUNTER — Encounter: Payer: Self-pay | Admitting: Family Medicine

## 2013-08-27 VITALS — BP 112/72 | HR 68 | Temp 97.8°F | Wt 161.6 lb

## 2013-08-27 DIAGNOSIS — J069 Acute upper respiratory infection, unspecified: Secondary | ICD-10-CM

## 2013-08-27 MED ORDER — AZELASTINE-FLUTICASONE 137-50 MCG/ACT NA SUSP: 1.0000 | Freq: Two times a day (BID) | NASAL | Status: DC

## 2013-08-27 MED ORDER — CETIRIZINE HCL 10 MG PO TABS
10.0000 mg | ORAL_TABLET | Freq: Every day | ORAL | Status: DC
Start: 2013-08-27 — End: 2014-04-05

## 2013-08-27 NOTE — Progress Notes (Signed)
  Subjective:     Sherry Jacobs is a 47 y.o. female who presents for evaluation of symptoms of a URI. Symptoms include bilateral ear pressure/pain, congestion, nasal congestion, no  fever and non productive cough. Onset of symptoms was 3 weeks ago, and has been gradually worsening since that time. Treatment to date: none.  The following portions of the patient's history were reviewed and updated as appropriate: allergies, current medications, past family history, past medical history, past social history, past surgical history and problem list.  Review of Systems Pertinent items are noted in HPI.   Objective:    BP 112/72  Pulse 68  Temp(Src) 97.8 F (36.6 C) (Oral)  Wt 161 lb 9.6 oz (73.301 kg)  BMI 25.3 kg/m2  SpO2 98% General appearance: alert, cooperative, appears stated age and no distress Ears: abnormal TM right ear - air-fluid level and abnormal TM left ear - air-fluid level Nose: clear discharge, mild congestion, turbinates red, swollen, no sinus tenderness Throat: abnormal findings: mild oropharyngeal erythema Neck: no adenopathy, supple, symmetrical, trachea midline and thyroid not enlarged, symmetric, no tenderness/mass/nodules Lungs: clear to auscultation bilaterally Heart: S1, S2 normal   Assessment:    viral upper respiratory illness   Plan:    Discussed diagnosis and treatment of URI. Suggested symptomatic OTC remedies. Nasal saline spray for congestion. Nasal steroids per orders. Follow up as needed.

## 2013-08-27 NOTE — Patient Instructions (Signed)

## 2013-09-24 LAB — HM MAMMOGRAPHY: HM Mammogram: NORMAL

## 2013-09-29 ENCOUNTER — Other Ambulatory Visit (HOSPITAL_COMMUNITY): Payer: Self-pay | Admitting: Obstetrics and Gynecology

## 2013-09-29 DIAGNOSIS — Z803 Family history of malignant neoplasm of breast: Secondary | ICD-10-CM

## 2013-10-01 ENCOUNTER — Other Ambulatory Visit: Payer: Self-pay

## 2013-10-08 ENCOUNTER — Encounter: Payer: Self-pay | Admitting: Family Medicine

## 2013-11-03 ENCOUNTER — Ambulatory Visit (HOSPITAL_COMMUNITY)
Admission: RE | Admit: 2013-11-03 | Discharge: 2013-11-03 | Disposition: A | Discharge status: Home / Self Care | Payer: 59 | Source: Ambulatory Visit | Attending: Obstetrics and Gynecology | Admitting: Obstetrics and Gynecology

## 2013-11-03 DIAGNOSIS — N6019 Diffuse cystic mastopathy of unspecified breast: Secondary | ICD-10-CM | POA: Insufficient documentation

## 2013-11-03 DIAGNOSIS — K7689 Other specified diseases of liver: Secondary | ICD-10-CM | POA: Insufficient documentation

## 2013-11-03 DIAGNOSIS — Z803 Family history of malignant neoplasm of breast: Secondary | ICD-10-CM | POA: Insufficient documentation

## 2013-11-03 LAB — HM MAMMOGRAPHY: HM Mammogram: NEGATIVE

## 2013-11-03 MED ORDER — GADOBENATE DIMEGLUMINE 529 MG/ML IV SOLN
15.0000 mL | Freq: Once | INTRAVENOUS | Status: AC | PRN
  Administered 2013-11-03: 15 mL via INTRAVENOUS

## 2014-04-05 ENCOUNTER — Encounter: Payer: Self-pay | Admitting: Family Medicine

## 2014-04-05 ENCOUNTER — Ambulatory Visit (INDEPENDENT_AMBULATORY_CARE_PROVIDER_SITE_OTHER): Payer: 59 | Admitting: Family Medicine

## 2014-04-05 VITALS — BP 110/72 | HR 70 | Temp 98.0°F | Ht 67.0 in | Wt 160.0 lb

## 2014-04-05 DIAGNOSIS — Z0000 Encounter for general adult medical examination without abnormal findings: Secondary | ICD-10-CM

## 2014-04-05 DIAGNOSIS — F4010 Social phobia, unspecified: Secondary | ICD-10-CM

## 2014-04-05 DIAGNOSIS — E559 Vitamin D deficiency, unspecified: Secondary | ICD-10-CM

## 2014-04-05 NOTE — Patient Instructions (Signed)

## 2014-04-05 NOTE — Progress Notes (Signed)
Pre visit review using our clinic review tool, if applicable. No additional management support is needed unless otherwise documented below in the visit note. 

## 2014-04-05 NOTE — Progress Notes (Signed)
Subjective:     ALYRIA KRACK is a 48 y.o. female and is here for a comprehensive physical exam. The patient reports no problems.  History   Social History  . Marital Status: Married    Spouse Name: N/A    Number of Children: N/A  . Years of Education: N/A   Occupational History  . Not on file.   Social History Main Topics  . Smoking status: Never Smoker   . Smokeless tobacco: Not on file  . Alcohol Use: No  . Drug Use: No  . Sexual Activity: Yes    Partners: Male   Other Topics Concern  . 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 Influenza Vaccine  06/26/2014  . Pap Smear  10/16/2014  . Mammogram  11/03/2014  . Tetanus/tdap  12/15/2018    The following portions of the patient's history were reviewed and updated as appropriate:  She  has a past medical history of IBS (irritable bowel syndrome); Fibromyalgia; Anxiety; Palpitations; and Autoimmune -nonspecific. She  does not have any pertinent problems on file. She  has past surgical history that includes Appendectomy (1981) and brachial cyst (2005). Her family history includes Alcohol abuse in an other family member; Arthritis in an other family member; Breast cancer in her mother and paternal grandmother; Diabetes in an other family member; Hyperlipidemia in an other family member; Hypertension in an other family member; Melanoma in her maternal grandfather; Stroke in an other family member; Thyroid disease in an other family member; Uterine cancer in an other family member. She  reports that she has never smoked. She does not have any smokeless tobacco history on file. She reports that she does not drink alcohol or use illicit drugs. She has a current medication list which includes the following prescription(s): alprazolam, hydroxychloroquine, lidocaine, multivitamin, propranolol, and tretinoin. Current Outpatient  Prescriptions on File Prior to Visit  Medication Sig Dispense Refill  . ALPRAZolam (XANAX) 0.25 MG tablet Take 0.25 mg by mouth as directed. 1-2 per month on average       . hydroxychloroquine (PLAQUENIL) 200 MG tablet Take 200 mg by mouth 2 (two) times daily.       Marland Kitchen lidocaine (LIDODERM) 5 % Place 1 patch onto the skin daily. Remove & Discard patch within 12 hours or as directed by MD Deveschwar      . Multiple Vitamin (MULTIVITAMIN) tablet Take 1 tablet by mouth daily.        . propranolol (INDERAL) 20 MG tablet Take 20 mg by mouth as needed.        . tretinoin (RETIN-A) 0.025 % cream Apply topically at bedtime.         No current facility-administered medications on file prior to visit.   She is allergic to sulfonamide derivatives..  Review of Systems Review of Systems  Constitutional: Negative for activity change, appetite change and fatigue.  HENT: Negative for hearing loss, congestion, tinnitus and ear discharge.  dentist q50m Eyes: Negative for visual disturbance (see optho q1y -- vision corrected to 20/20 with glasses).  Respiratory: Negative for cough, chest tightness and shortness of breath.   Cardiovascular: Negative for chest pain, palpitations and leg swelling.  Gastrointestinal: Negative for abdominal pain, diarrhea, constipation and abdominal distention.  Genitourinary: Negative  for urgency, frequency, decreased urine volume and difficulty urinating.  Musculoskeletal: Negative for back pain, arthralgias and gait problem.  Skin: Negative for color change, pallor and rash.  Neurological: Negative for dizziness, light-headedness, numbness and headaches.  Hematological: Negative for adenopathy. Does not bruise/bleed easily.  Psychiatric/Behavioral: Negative for suicidal ideas, confusion, sleep disturbance, self-injury, dysphoric mood, decreased concentration and agitation.      Objective:    BP 110/72  Pulse 70  Temp(Src) 98 F (36.7 C) (Oral)  Ht 5\' 7"  (1.702 m)  Wt  160 lb (72.576 kg)  BMI 25.05 kg/m2  SpO2 97%  LMP 03/19/2014 General appearance: alert, cooperative, appears stated age and no distress Head: Normocephalic, without obvious abnormality, atraumatic Eyes: {eye exam:201::"conjunctivae/corneas clear. PERRL, EOM's intact. Fundi benign."} Ears: {ear exam:5207::"normal TM's and external ear canals both ears"} Nose: {nose exam:13771::"Nares normal. Septum midline. Mucosa normal. No drainage or sinus tenderness."} Throat: {throat exam:17160::"lips, mucosa, and tongue normal; teeth and gums normal"} Neck: {neck exam:17463::"no adenopathy","no carotid bruit","no JVD","supple, symmetrical, trachea midline","thyroid not enlarged, symmetric, no tenderness/mass/nodules"} Back: {back exam:801::"symmetric, no curvature. ROM normal. No CVA tenderness."} Lungs: {lung exam:16931} Breasts: gyn Heart: {heart exam:5510} Abdomen: {abdominal exam:16834} Pelvic: deferred--gyn Extremities: {extremity exam:5109} Pulses: {pulse exam:10866::"2+ and symmetric"} Skin: {skin exam:31329::"Skin color, texture, turgor normal. No rashes or lesions"} Lymph nodes: {lymph node exam:14039::"Cervical, supraclavicular, and axillary nodes normal."} Neurologic: {neuro exam:17854} ***    Assessment:    Healthy female exam. ***     Plan:     See After Visit Summary for Counseling Recommendations

## 2014-06-03 ENCOUNTER — Encounter: Payer: Self-pay | Admitting: Emergency Medicine

## 2014-06-03 ENCOUNTER — Emergency Department
Admission: EM | Admit: 2014-06-03 | Discharge: 2014-06-03 | Disposition: A | Discharge status: Home / Self Care | Payer: 59 | Source: Home / Self Care

## 2014-06-03 DIAGNOSIS — N3001 Acute cystitis with hematuria: Secondary | ICD-10-CM

## 2014-06-03 DIAGNOSIS — N3000 Acute cystitis without hematuria: Secondary | ICD-10-CM

## 2014-06-03 LAB — POCT URINALYSIS DIP (MANUAL ENTRY)
Bilirubin, UA: NEGATIVE
Glucose, UA: NEGATIVE
Ketones, POC UA: NEGATIVE
Nitrite, UA: NEGATIVE
Spec Grav, UA: 1.01 (ref 1.005–1.03)
Urobilinogen, UA: 0.2 (ref 0–1)
pH, UA: 7 (ref 5–8)

## 2014-06-03 MED ORDER — CIPROFLOXACIN HCL 500 MG PO TABS: 500.0000 mg | ORAL_TABLET | Freq: Two times a day (BID) | ORAL | Status: DC

## 2014-06-03 NOTE — ED Notes (Signed)
States began experiencing dysuria and frequency around 2 hours ago. This is a typical sudden onset UTI syndrome for her.

## 2014-06-03 NOTE — ED Provider Notes (Signed)
CSN: [id number]     Arrival date & time 06/03/14  1939 History   None    Chief Complaint  Patient presents with  . Dysuria  . Urinary Frequency   (Consider location/radiation/quality/duration/timing/severity/associated sxs/prior Treatment) HPI This is a 48 y.o. female who presents today with UTI symptoms for one day + dysuria + frequency + urgency No hematuria No vaginal discharge No fever/chills No lower abdominal pain No nausea No vomiting No back pain No fatigue She denies chance of pregnancy. Has tried over-the-counter measures without improvement.    Past Medical History  Diagnosis Date  . IBS (irritable bowel syndrome)   . Fibromyalgia   . Anxiety   . Palpitations     PACs documented  . Autoimmune -nonspecific     On plaqunil   Past Surgical History  Procedure Laterality Date  . Appendectomy  1981  . Brachial cyst  2005   Family History  Problem Relation Age of Onset  . Diabetes    . Hypertension    . Alcohol abuse    . Hyperlipidemia    . Arthritis    . Breast cancer Paternal Grandmother   . Stroke    . Thyroid disease    . Melanoma Maternal Grandfather   . Breast cancer Mother   . Uterine cancer     History  Substance Use Topics  . Smoking status: Never Smoker   . Smokeless tobacco: Not on file  . Alcohol Use: No   OB History   Grav Para Term Preterm Abortions TAB SAB Ect Mult Living                 Review of Systems  All other systems reviewed and are negative.   Allergies  Sulfonamide derivatives  Home Medications   Prior to Admission medications   Medication Sig Start Date End Date Taking? Authorizing Provider  ALPRAZolam (XANAX) 0.25 MG tablet Take 0.25 mg by mouth as directed. 1-2 per month on average     Historical Provider, MD  ciprofloxacin (CIPRO) 500 MG tablet Take 1 tablet (500 mg total) by mouth 2 (two) times daily. 06/03/14   Jacqulyn Cane, MD  hydroxychloroquine (PLAQUENIL) 200 MG tablet Take 200 mg by mouth 2 (two)  times daily.     Historical Provider, MD  lidocaine (LIDODERM) 5 % Place 1 patch onto the skin daily. Remove & Discard patch within 12 hours or as directed by MD Dexter    Historical Provider, MD  Multiple Vitamin (MULTIVITAMIN) tablet Take 1 tablet by mouth daily.      Historical Provider, MD  propranolol (INDERAL) 20 MG tablet Take 20 mg by mouth as needed.      Historical Provider, MD  tretinoin (RETIN-A) 0.025 % cream Apply topically at bedtime.      Historical Provider, MD   BP 103/69  Pulse 71  Temp(Src) 97.6 F (36.4 C) (Oral)  Resp 16  Ht 5\' 7"  (1.702 m)  Wt 155 lb (70.308 kg)  BMI 24.27 kg/m2  SpO2 100%  LMP 06/03/2014 Physical Exam  Nursing note and vitals reviewed. Constitutional: She is oriented to person, place, and time. She appears well-developed and well-nourished. No distress.  HENT:  Mouth/Throat: Oropharynx is clear and moist.  Eyes: No scleral icterus.  Neck: Neck supple.  Cardiovascular: Normal rate, regular rhythm and normal heart sounds.   Pulmonary/Chest: Breath sounds normal.  Abdominal: Soft. She exhibits no mass. There is no hepatosplenomegaly. There is tenderness in the suprapubic area. There is  no rebound, no guarding and no CVA tenderness.  Lymphadenopathy:    She has no cervical adenopathy.  Neurological: She is alert and oriented to person, place, and time.  Skin: Skin is warm and dry.    ED Course  Procedures (including critical care time) Labs Review Labs Reviewed  URINE CULTURE  POCT URINALYSIS DIP (MANUAL ENTRY)   Results for orders placed during the hospital encounter of 06/03/14  POCT URINALYSIS DIP (MANUAL ENTRY)      Result Value Ref Range   Color, UA light yellow     Clarity, UA clear     Glucose, UA neg     Bilirubin, UA negative     Bilirubin, UA negative     Spec Grav, UA 1.010  1.005 - 1.03   Blood, UA large     pH, UA 7.0  5 - 8   Protein Ur, POC trace     Urobilinogen, UA 0.2  0 - 1   Nitrite, UA Negative      Leukocytes, UA Trace      Imaging Review No results found.   MDM   1. Acute cystitis with hematuria    Treatment options discussed, as well as risks, benefits, alternatives. Patient voiced understanding and agreement with the following plans: Urine culture Cipro 500 twice a day x7 days Other symptomatic care and advice given. Red flags discussed. Followup with PCP 7-14 days, sooner if worse or new symptoms or red flags. She voiced understanding and agreement.    Jacqulyn Cane, MD 06/03/14 2012

## 2014-06-06 LAB — URINE CULTURE: Colony Count: >=100000

## 2014-06-07 ENCOUNTER — Telehealth: Payer: Self-pay | Admitting: *Deleted

## 2014-06-09 ENCOUNTER — Telehealth: Payer: Self-pay | Admitting: Family Medicine

## 2014-06-09 NOTE — Telephone Encounter (Signed)
Please advise      KP 

## 2014-06-09 NOTE — Telephone Encounter (Signed)
5 days should be enough 

## 2014-06-09 NOTE — Telephone Encounter (Addendum)
Caller name:Selina Relation to AJ:LUNGBMB Call back number: [phone] Pharmacy:  Reason for call: patient states that she went to [facility] urgent care in [place] and was put on Cipro for seven days. Patient feels like that is a long time to be taking Cipro. She would like Dr Nonda Lou opinion. Please advise.

## 2014-06-09 NOTE — Telephone Encounter (Signed)
Made her aware that 5 days should be enough and she voiced understanding.      KP

## 2014-09-10 ENCOUNTER — Other Ambulatory Visit: Payer: Self-pay

## 2014-10-13 ENCOUNTER — Telehealth: Payer: Self-pay | Admitting: Family Medicine

## 2014-10-13 NOTE — Telephone Encounter (Signed)
Orders are in the system listed for Future so the patient is aware it is ok to get them done.    KP

## 2014-10-13 NOTE — Telephone Encounter (Signed)
Pt would like for you to call her , regarding her cpe blood work, pt states she never went to [facility] to get the blood work, and would like to know if she can go tomorrow and have it done.

## 2014-10-14 ENCOUNTER — Other Ambulatory Visit (INDEPENDENT_AMBULATORY_CARE_PROVIDER_SITE_OTHER): Payer: 59

## 2014-10-14 ENCOUNTER — Other Ambulatory Visit (HOSPITAL_COMMUNITY): Payer: Self-pay | Admitting: Obstetrics and Gynecology

## 2014-10-14 DIAGNOSIS — Z0000 Encounter for general adult medical examination without abnormal findings: Secondary | ICD-10-CM

## 2014-10-14 DIAGNOSIS — E559 Vitamin D deficiency, unspecified: Secondary | ICD-10-CM

## 2014-10-14 DIAGNOSIS — Z803 Family history of malignant neoplasm of breast: Secondary | ICD-10-CM

## 2014-10-14 LAB — HEPATIC FUNCTION PANEL
ALBUMIN: 3.9 g/dL (ref 3.5–5.2)
ALT: 18 U/L (ref 0–35)
AST: 22 U/L (ref 0–37)
Alkaline Phosphatase: 49 U/L (ref 39–117)
Bilirubin, Direct: 0.1 mg/dL (ref 0.0–0.3)
TOTAL PROTEIN: 6.8 g/dL (ref 6.0–8.3)
Total Bilirubin: 0.5 mg/dL (ref 0.2–1.2)

## 2014-10-14 LAB — BASIC METABOLIC PANEL
BUN: 14 mg/dL (ref 6–23)
CO2: 26 meq/L (ref 19–32)
Calcium: 9 mg/dL (ref 8.4–10.5)
Chloride: 103 mEq/L (ref 96–112)
Creatinine, Ser: 0.8 mg/dL (ref 0.4–1.2)
GFR: 81.98 mL/min (ref >60.00)
Glucose, Bld: 83 mg/dL (ref 70–99)
POTASSIUM: 3.9 meq/L (ref 3.5–5.1)
SODIUM: 136 meq/L (ref 135–145)

## 2014-10-14 LAB — CBC WITH DIFFERENTIAL/PLATELET
Basophils Absolute: 0 10*3/uL (ref 0.0–0.1)
Basophils Relative: 0.5 % (ref 0.0–3.0)
EOS ABS: 0.1 10*3/uL (ref 0.0–0.7)
Eosinophils Relative: 1.5 % (ref 0.0–5.0)
HEMATOCRIT: 36.6 % (ref 36.0–46.0)
HEMOGLOBIN: 11.7 g/dL — AB (ref 12.0–15.0)
LYMPHS ABS: 1.3 10*3/uL (ref 0.7–4.0)
Lymphocytes Relative: 26.4 % (ref 12.0–46.0)
MCHC: 32 g/dL (ref 30.0–36.0)
MCV: 80.8 fl (ref 78.0–100.0)
Monocytes Absolute: 0.6 10*3/uL (ref 0.1–1.0)
Monocytes Relative: 11.5 % (ref 3.0–12.0)
NEUTROS ABS: 3 10*3/uL (ref 1.4–7.7)
Neutrophils Relative %: 60.1 % (ref 43.0–77.0)
Platelets: 218 10*3/uL (ref 150.0–400.0)
RBC: 4.53 Mil/uL (ref 3.87–5.11)
RDW: 15.7 % — ABNORMAL HIGH (ref 11.5–15.5)
WBC: 5.1 10*3/uL (ref 4.0–10.5)

## 2014-10-14 LAB — LIPID PANEL
CHOLESTEROL: 199 mg/dL (ref 0–200)
HDL: 69.7 mg/dL (ref >39.00)
LDL Cholesterol: 120 mg/dL — ABNORMAL HIGH (ref 0–99)
NonHDL: 129.3
Total CHOL/HDL Ratio: 3
Triglycerides: 47 mg/dL (ref 0.0–149.0)
VLDL: 9.4 mg/dL (ref 0.0–40.0)

## 2014-10-14 LAB — TSH: TSH: 2.29 u[IU]/mL (ref 0.35–4.50)

## 2014-10-19 ENCOUNTER — Encounter (HOSPITAL_BASED_OUTPATIENT_CLINIC_OR_DEPARTMENT_OTHER): Payer: Self-pay | Admitting: *Deleted

## 2014-10-19 LAB — VITAMIN D 1,25 DIHYDROXY
VITAMIN D3 1, 25 (OH): 51 pg/mL
Vitamin D 1, 25 (OH)2 Total: 51 pg/mL (ref 18–72)

## 2014-10-20 ENCOUNTER — Other Ambulatory Visit: Payer: Self-pay | Admitting: Family Medicine

## 2014-10-20 ENCOUNTER — Encounter (HOSPITAL_BASED_OUTPATIENT_CLINIC_OR_DEPARTMENT_OTHER): Payer: Self-pay | Admitting: *Deleted

## 2014-10-20 DIAGNOSIS — E559 Vitamin D deficiency, unspecified: Secondary | ICD-10-CM

## 2014-10-20 MED ORDER — VITAMIN D (ERGOCALCIFEROL) 1.25 MG (50000 UNIT) PO CAPS: 50000.0000 [IU] | ORAL_CAPSULE | ORAL | Status: DC

## 2014-10-20 NOTE — Progress Notes (Signed)
NPO AFTER MN. ARRIVE AT 0600. NEEDS HG AND URINE PREG. WILL TAKE AM MED W/ SIPS OF WATER. PRE-OP ORDERS PENDING.

## 2014-10-25 ENCOUNTER — Other Ambulatory Visit: Payer: Self-pay

## 2014-10-25 MED ORDER — VITAMIN D (ERGOCALCIFEROL) 1.25 MG (50000 UNIT) PO CAPS: 50000.0000 [IU] | ORAL_CAPSULE | ORAL | Status: DC

## 2014-10-25 NOTE — H&P (Addendum)
Sherry Jacobs is an 48 y.o. female. With heavy irregular menses presents for surgical mngt.  Polypoid mass noted on ultrasound.       Patient's last menstrual period was 09/19/2014 (exact date).    Past Medical History  Diagnosis Date  . IBS (irritable bowel syndrome)   . Autoimmune disease     ANA positive  . ANA positive   . Social anxiety disorder   . Arthralgia   . Endometrial polyp   . PONV (postoperative nausea and vomiting)   . Iron deficiency anemia   . Wears contact lenses   . Lactose intolerance in adult     Past Surgical History  Procedure Laterality Date  . Appendectomy  1981  . Right modified neck dissection w/ removal benign right parapharyngeal space mass and lymph nodes  08-02-2004    benign bronchogenic cysts / negative lymph nodes    Family History  Problem Relation Age of Onset  . Diabetes    . Hypertension    . Alcohol abuse    . Hyperlipidemia    . Arthritis    . Breast cancer Paternal Grandmother   . Stroke    . Thyroid disease    . Melanoma Maternal Grandfather   . Breast cancer Mother   . Uterine cancer      Social History:  reports that she has never smoked. She has never used smokeless tobacco. She reports that she does not drink alcohol or use illicit drugs.  Allergies:  Allergies  Allergen Reactions  . Sulfa Antibiotics Nausea And Vomiting    No prescriptions prior to admission    ROS  Height 5\' 7"  (1.702 m), weight 70.308 kg (155 lb), last menstrual period 09/19/2014. Physical Exam  Gen - NAD Abd - soft, NT/ND Ext - NT, no edema PV - uterus mobile, NT.  No adnexal masses CV - RRR Lungs - clear  [place]:  3mm polypoid mass, normal adnexa.  No free fluid  Assessment/Plan:  Heavy, irregular bleeding Hysteroscopy, d&C, resection of endometrial polyp R/b/a discussed, questions answered, informed consent  Jaylee Freeze 10/25/2014, 1:44 PM

## 2014-10-25 NOTE — Progress Notes (Signed)
Pt called with questions regarding surgery 12/3.  Pt reminded npo p mn, no dark nail polish, make-up ok. No shaving of arms legs 24 - 48 hrs preop. Sherry Kitchenstill no orders entered by Dr. Julien Girt, pt aware.  Left message for Nira Conn, OR scheduler, that we need preop orders .

## 2014-10-28 ENCOUNTER — Encounter (HOSPITAL_BASED_OUTPATIENT_CLINIC_OR_DEPARTMENT_OTHER)
Admission: RE | Disposition: A | Discharge status: Home / Self Care | Payer: Self-pay | Source: Ambulatory Visit | Attending: Obstetrics and Gynecology

## 2014-10-28 ENCOUNTER — Ambulatory Visit (HOSPITAL_BASED_OUTPATIENT_CLINIC_OR_DEPARTMENT_OTHER)
Admission: RE | Admit: 2014-10-28 | Discharge: 2014-10-28 | Disposition: A | Discharge status: Home / Self Care | Payer: 59 | Source: Ambulatory Visit | Attending: Obstetrics and Gynecology | Admitting: Obstetrics and Gynecology

## 2014-10-28 ENCOUNTER — Ambulatory Visit (HOSPITAL_BASED_OUTPATIENT_CLINIC_OR_DEPARTMENT_OTHER): Payer: 59 | Admitting: Anesthesiology

## 2014-10-28 ENCOUNTER — Encounter (HOSPITAL_BASED_OUTPATIENT_CLINIC_OR_DEPARTMENT_OTHER): Payer: Self-pay | Admitting: *Deleted

## 2014-10-28 DIAGNOSIS — F419 Anxiety disorder, unspecified: Secondary | ICD-10-CM | POA: Diagnosis not present

## 2014-10-28 DIAGNOSIS — M359 Systemic involvement of connective tissue, unspecified: Secondary | ICD-10-CM | POA: Diagnosis not present

## 2014-10-28 DIAGNOSIS — N840 Polyp of corpus uteri: Secondary | ICD-10-CM | POA: Insufficient documentation

## 2014-10-28 DIAGNOSIS — K589 Irritable bowel syndrome without diarrhea: Secondary | ICD-10-CM | POA: Insufficient documentation

## 2014-10-28 DIAGNOSIS — E739 Lactose intolerance, unspecified: Secondary | ICD-10-CM | POA: Insufficient documentation

## 2014-10-28 DIAGNOSIS — Z882 Allergy status to sulfonamides status: Secondary | ICD-10-CM | POA: Diagnosis not present

## 2014-10-28 DIAGNOSIS — N939 Abnormal uterine and vaginal bleeding, unspecified: Secondary | ICD-10-CM | POA: Insufficient documentation

## 2014-10-28 HISTORY — DX: Pain in unspecified joint: M25.50

## 2014-10-28 HISTORY — DX: Other specified abnormal immunological findings in serum: R76.8

## 2014-10-28 HISTORY — DX: Social phobia, unspecified: F40.10

## 2014-10-28 HISTORY — DX: Other specified postprocedural states: Z98.890

## 2014-10-28 HISTORY — DX: Nausea with vomiting, unspecified: R11.2

## 2014-10-28 HISTORY — PX: DILATATION & CURETTAGE/HYSTEROSCOPY WITH MYOSURE: SHX6511

## 2014-10-28 HISTORY — DX: Presence of spectacles and contact lenses: Z97.3

## 2014-10-28 HISTORY — DX: Iron deficiency anemia, unspecified: D50.9

## 2014-10-28 HISTORY — DX: Lactose intolerance, unspecified: E73.9

## 2014-10-28 HISTORY — DX: Systemic involvement of connective tissue, unspecified: M35.9

## 2014-10-28 HISTORY — DX: Polyp of corpus uteri: N84.0

## 2014-10-28 LAB — CBC
HCT: 37.2 % (ref 36.0–46.0)
HEMOGLOBIN: 12 g/dL (ref 12.0–15.0)
MCH: 26 pg (ref 26.0–34.0)
MCHC: 32.3 g/dL (ref 30.0–36.0)
MCV: 80.5 fL (ref 78.0–100.0)
Platelets: 238 10*3/uL (ref 150–400)
RBC: 4.62 MIL/uL (ref 3.87–5.11)
RDW: 15.1 % (ref 11.5–15.5)
WBC: 4.9 10*3/uL (ref 4.0–10.5)

## 2014-10-28 SURGERY — DILATATION & CURETTAGE/HYSTEROSCOPY WITH MYOSURE
Anesthesia: General | Site: Uterus

## 2014-10-28 MED ORDER — MIDAZOLAM HCL 2 MG/2ML IJ SOLN
INTRAMUSCULAR | Status: AC
  Filled 2014-10-28: qty 2

## 2014-10-28 MED ORDER — KETOROLAC TROMETHAMINE 30 MG/ML IJ SOLN
INTRAMUSCULAR | Status: DC | PRN
  Administered 2014-10-28: 30 mg via INTRAVENOUS

## 2014-10-28 MED ORDER — DEXAMETHASONE SODIUM PHOSPHATE 4 MG/ML IJ SOLN
INTRAMUSCULAR | Status: DC | PRN
  Administered 2014-10-28: 10 mg via INTRAVENOUS

## 2014-10-28 MED ORDER — PROMETHAZINE HCL 25 MG/ML IJ SOLN
6.2500 mg | INTRAMUSCULAR | Status: DC | PRN
  Filled 2014-10-28: qty 1

## 2014-10-28 MED ORDER — ACETAMINOPHEN 10 MG/ML IV SOLN
1000.0000 mg | Freq: Once | INTRAVENOUS | Status: AC
  Administered 2014-10-28: 1000 mg via INTRAVENOUS
  Filled 2014-10-28: qty 100

## 2014-10-28 MED ORDER — METOCLOPRAMIDE HCL 5 MG/ML IJ SOLN
INTRAMUSCULAR | Status: DC | PRN
  Administered 2014-10-28: 10 mg via INTRAVENOUS

## 2014-10-28 MED ORDER — FENTANYL CITRATE 0.05 MG/ML IJ SOLN
25.0000 ug | INTRAMUSCULAR | Status: DC | PRN
  Filled 2014-10-28: qty 1

## 2014-10-28 MED ORDER — KETOROLAC TROMETHAMINE 30 MG/ML IJ SOLN
15.0000 mg | Freq: Once | INTRAMUSCULAR | Status: DC | PRN
  Filled 2014-10-28: qty 1

## 2014-10-28 MED ORDER — DEXTROSE 5 % IV SOLN
2.0000 g | INTRAVENOUS | Status: AC
  Administered 2014-10-28: 2 g via INTRAVENOUS
  Filled 2014-10-28: qty 2

## 2014-10-28 MED ORDER — FENTANYL CITRATE 0.05 MG/ML IJ SOLN
INTRAMUSCULAR | Status: AC
  Filled 2014-10-28: qty 2

## 2014-10-28 MED ORDER — OXYCODONE-ACETAMINOPHEN 5-325 MG PO TABS: 1.0000 | ORAL_TABLET | ORAL | Status: DC | PRN

## 2014-10-28 MED ORDER — PROPOFOL 10 MG/ML IV BOLUS
INTRAVENOUS | Status: DC | PRN
  Administered 2014-10-28: 200 mg via INTRAVENOUS

## 2014-10-28 MED ORDER — FENTANYL CITRATE 0.05 MG/ML IJ SOLN
INTRAMUSCULAR | Status: DC | PRN
  Administered 2014-10-28 (×2): 50 ug via INTRAVENOUS

## 2014-10-28 MED ORDER — MIDAZOLAM HCL 5 MG/5ML IJ SOLN
INTRAMUSCULAR | Status: DC | PRN
  Administered 2014-10-28: 2 mg via INTRAVENOUS

## 2014-10-28 MED ORDER — ONDANSETRON HCL 4 MG/2ML IJ SOLN
INTRAMUSCULAR | Status: DC | PRN
  Administered 2014-10-28: 4 mg via INTRAVENOUS

## 2014-10-28 MED ORDER — LIDOCAINE HCL (CARDIAC) 20 MG/ML IV SOLN
INTRAVENOUS | Status: DC | PRN
  Administered 2014-10-28: 80 mg via INTRAVENOUS

## 2014-10-28 MED ORDER — LIDOCAINE HCL 1 % IJ SOLN
INTRAMUSCULAR | Status: DC | PRN
  Administered 2014-10-28: 10 mL

## 2014-10-28 MED ORDER — SODIUM CHLORIDE 0.9 % IR SOLN
Status: DC | PRN
  Administered 2014-10-28: 3000 mL

## 2014-10-28 MED ORDER — LACTATED RINGERS IV SOLN
INTRAVENOUS | Status: DC
  Administered 2014-10-28 (×2): via INTRAVENOUS
  Filled 2014-10-28: qty 1000

## 2014-10-28 SURGICAL SUPPLY — 30 items
ABLATOR ENDOMETRIAL BIPOLAR (ABLATOR) IMPLANT
CANISTER SUCTION 2500CC (MISCELLANEOUS) IMPLANT
CATH ROBINSON RED A/P 16FR (CATHETERS) ×2 IMPLANT
COVER TABLE BACK 60X90 (DRAPES) ×2 IMPLANT
DEVICE MYOSURE LITE (MISCELLANEOUS) ×1 IMPLANT
DRAPE CAMERA CLOSED 9X96 (DRAPES) ×1 IMPLANT
DRAPE HYSTEROSCOPY (DRAPE) ×2 IMPLANT
DRAPE LG THREE QUARTER DISP (DRAPES) ×2 IMPLANT
GLOVE BIO SURGEON STRL SZ 6.5 (GLOVE) ×5 IMPLANT
GLOVE BIOGEL PI IND STRL 7.0 (GLOVE) ×1 IMPLANT
GLOVE BIOGEL PI INDICATOR 7.0 (GLOVE) ×1
GLOVE INDICATOR 6.5 STRL GRN (GLOVE) ×1 IMPLANT
GLOVE INDICATOR 7.5 STRL GRN (GLOVE) ×1 IMPLANT
GOWN PREVENTION PLUS LG XLONG (DISPOSABLE) ×1 IMPLANT
GOWN STRL REIN XL XLG (GOWN DISPOSABLE) ×1 IMPLANT
GOWN STRL REUS W/ TWL LRG LVL3 (GOWN DISPOSABLE) IMPLANT
GOWN STRL REUS W/TWL LRG LVL3 (GOWN DISPOSABLE) ×4
LEGGING LITHOTOMY PAIR STRL (DRAPES) ×2 IMPLANT
LOOP ANGLED CUTTING 22FR (CUTTING LOOP) IMPLANT
PACK BASIN DAY SURGERY FS (CUSTOM PROCEDURE TRAY) ×2 IMPLANT
PAD OB MATERNITY 4.3X12.25 (PERSONAL CARE ITEMS) ×2 IMPLANT
PAD PREP 24X48 CUFFED NSTRL (MISCELLANEOUS) ×2 IMPLANT
SEAL ×1 IMPLANT
SET TUBING HYSTEROSCOPY 2 NDL (TUBING) ×1 IMPLANT
TISSUE TRAP ×1 IMPLANT
TOWEL OR 17X24 6PK STRL BLUE (TOWEL DISPOSABLE) ×4 IMPLANT
TRAY DSU PREP LF (CUSTOM PROCEDURE TRAY) ×2 IMPLANT
TUBE HYSTEROSCOPY W Y-CONNECT (TUBING) ×1 IMPLANT
TUBING ×2 IMPLANT
WATER STERILE IRR 500ML POUR (IV SOLUTION) ×2 IMPLANT

## 2014-10-28 NOTE — Anesthesia Preprocedure Evaluation (Signed)
Anesthesia Evaluation  Patient identified by MRN, date of birth, ID band Patient awake    Reviewed: Allergy & Precautions, H&P , NPO status , Patient's Chart, lab work & pertinent test results  History of Anesthesia Complications (+) PONV  Airway Mallampati: II  TM Distance: >3 FB Neck ROM: Full    Dental no notable dental hx.    Pulmonary neg pulmonary ROS,  breath sounds clear to auscultation  Pulmonary exam normal       Cardiovascular negative cardio ROS  Rhythm:Regular Rate:Normal     Neuro/Psych negative neurological ROS  negative psych ROS   GI/Hepatic negative GI ROS, Neg liver ROS,   Endo/Other  negative endocrine ROS  Renal/GU negative Renal ROS  negative genitourinary   Musculoskeletal negative musculoskeletal ROS (+)   Abdominal   Peds negative pediatric ROS (+)  Hematology negative hematology ROS (+) Autoimmune disease  ANA positive ANA positive     Anesthesia Other Findings   Reproductive/Obstetrics negative OB ROS                             Anesthesia Physical Anesthesia Plan  ASA: II  Anesthesia Plan: General   Post-op Pain Management:    Induction: Intravenous  Airway Management Planned: LMA  Additional Equipment:   Intra-op Plan:   Post-operative Plan: Extubation in OR  Informed Consent: I have reviewed the patients History and Physical, chart, labs and discussed the procedure including the risks, benefits and alternatives for the proposed anesthesia with the patient or authorized representative who has indicated his/her understanding and acceptance.   Dental advisory given  Plan Discussed with: CRNA and Surgeon  Anesthesia Plan Comments:         Anesthesia Quick Evaluation

## 2014-10-28 NOTE — Anesthesia Postprocedure Evaluation (Signed)
  Anesthesia Post-op Note  Patient: Sherry Jacobs  Procedure(s) Performed: Procedure(s) (LRB): DILATATION & CURETTAGE/HYSTEROSCOPY WITH MYOSURE (N/A)  Patient Location: PACU  Anesthesia Type: General  Level of Consciousness: awake and alert   Airway and Oxygen Therapy: Patient Spontanous Breathing  Post-op Pain: mild  Post-op Assessment: Post-op Vital signs reviewed, Patient's Cardiovascular Status Stable, Respiratory Function Stable, Patent Airway and No signs of Nausea or vomiting  Last Vitals:  Filed Vitals:   10/28/14 0818  BP:   Pulse: 87  Temp:   Resp: 19    Post-op Vital Signs: stable   Complications: No apparent anesthesia complications

## 2014-10-28 NOTE — Transfer of Care (Signed)
Immediate Anesthesia Transfer of Care Note  Patient: Sherry Jacobs  Procedure(s) Performed: Procedure(s) (LRB): DILATATION & CURETTAGE/HYSTEROSCOPY WITH MYOSURE (N/A)  Patient Location: PACU  Anesthesia Type: General  Level of Consciousness:sleepy, follows commands, oral airway removed upon arrival to PACU.  Airway & Oxygen Therapy: Patient Spontanous Breathing and Patient connected to nasal cannula oxygen  Post-op Assessment: Report given to PACU RN and Post -op Vital signs reviewed and stable  Post vital signs: Reviewed and stable  Complications: No apparent anesthesia complications

## 2014-10-28 NOTE — Op Note (Signed)
NAMEATIANA, LEVIER NO.:  [id number]  MEDICAL RECORD NO.:  [id number]  LOCATION:                                 FACILITY:  PHYSICIAN:  Marylynn Pearson, MD    DATE OF BIRTH:  22-Aug-1966  DATE OF PROCEDURE:  10/28/2014 DATE OF DISCHARGE:  10/28/2014                              OPERATIVE REPORT   PREOPERATIVE DIAGNOSIS:  Irregular vaginal bleeding.  POSTOPERATIVE DIAGNOSIS:  Irregular vaginal bleeding.  PROCEDURES: 1. Paracervical block. 2. Hysteroscopy, D and C. 3. MyoSure resection of endometrial polyp.  SURGEON:  Marylynn Pearson, MD.  ANESTHESIA:  General.  COMPLICATIONS:  None.  SPECIMEN:  Endometrial polyp with curettings.  COMPLICATIONS:  None.  CONDITION:  Stable to recovery room.  PROCEDURE:  The patient was taken to the operating room, where she was given general anesthesia, placed in the dorsal lithotomy position using Allen stirrups.  Prepped and draped in sterile fashion.  In and out catheter was used to drain her bladder for an unmeasured amount of urine.  Bivalve speculum was placed in the vagina and 1 mL of 1% lidocaine was injected at the 12 o'clock position of the cervix.  Single- tooth tenaculum was attached to the anterior lip of the cervix and the remaining 9 mL was used to perform a paracervical block.  The cervix was dilated using Pratt dilators and the diagnostic hysteroscope was inserted.  Fluffy polypoid mass was noted at the anterior uterine wall just superior to the internal cervical os.  Bilateral ostia were visualized and appeared normal.  She appeared to have more fluffy endometrium in the lower uterine segment whereas the uterus appeared more atrophic near the fundus.  The MyoSure blade was inserted and polypoid mass was excised.  No other abnormalities were noted.  All instruments were removed from the uterus.  Tenaculum was removed from the cervix.  The cervix was hemostatic.  Speculum was removed.  Sponge, lap,  needle, and instrument counts were correct x2.     Marylynn Pearson, MD     GA/MEDQ  D:  10/28/2014  T:  10/28/2014  Job:  [id number]

## 2014-10-28 NOTE — Anesthesia Procedure Notes (Signed)
Procedure Name: LMA Insertion Date/Time: 10/28/2014 7:33 AM Performed by: Mechele Claude Pre-anesthesia Checklist: Patient identified, Emergency Drugs available, Suction available and Patient being monitored Patient Re-evaluated:Patient Re-evaluated prior to inductionOxygen Delivery Method: Circle System Utilized Preoxygenation: Pre-oxygenation with 100% oxygen Intubation Type: IV induction Ventilation: Mask ventilation without difficulty LMA: LMA inserted LMA Size: 4.0 Number of attempts: 1 Airway Equipment and Method: bite block Placement Confirmation: positive ETCO2 Tube secured with: Tape Dental Injury: Teeth and Oropharynx as per pre-operative assessment

## 2014-10-28 NOTE — Discharge Instructions (Signed)
FU office 2-3 weeks for postop appointment.  Call the office [phone] for an appointment.  Personal Hygiene: Use pads not tampons x 1week You may shower, no tub baths or pools for 2-3 weeks Wipe from front to back when using restroom  Activity: Do not drive or operate any equipment for 24 hrs.   Do not rest in bed all day Walking is encouraged Walk up and down stairs slowly You may return to your normal activity in 1-2 days  Sexual Activity:  No intercourse for 2 weeks after the procedure.  Diet: Eat a light meal as desired this evening.  You may resume your usual diet tomorrow.  Return to work:  You may resume your work activities after 1-2 days  What to expect:  Expect to have vaginal bleeding/discharge for 2-3 days and spotting for 10-14 days.  It is not unusual to have soreness for 1-2 weeks.  You may have a slight burning sensation when you urinate for the first few days.  You may start your menses in 2-6 weeks.  Mild cramps may continue for a couple of days.    Call your doctor:   Excessive bleeding, saturating a pad every hour Inability to urinate 6 hours after discharge Pain not relieved with pain medications Fever of 100.4 or greater   Post Anesthesia Home Care Instructions  Activity: Get plenty of rest for the remainder of the day. A responsible adult should stay with you for 24 hours following the procedure.  For the next 24 hours, DO NOT: -Drive a car -Paediatric nurse -Drink alcoholic beverages -Take any medication unless instructed by your physician -Make any legal decisions or sign important papers.  Meals: Start with liquid foods such as gelatin or soup. Progress to regular foods as tolerated. Avoid greasy, spicy, heavy foods. If nausea and/or vomiting occur, drink only clear liquids until the nausea and/or vomiting subsides. Call your physician if vomiting continues.  Special Instructions/Symptoms: Your throat may feel dry or sore from the anesthesia or  the breathing tube placed in your throat during surgery. If this causes discomfort, gargle with warm salt water. The discomfort should disappear within 24 hours.

## 2014-10-29 ENCOUNTER — Encounter (HOSPITAL_BASED_OUTPATIENT_CLINIC_OR_DEPARTMENT_OTHER): Payer: Self-pay | Admitting: Obstetrics and Gynecology

## 2014-11-02 ENCOUNTER — Encounter (HOSPITAL_BASED_OUTPATIENT_CLINIC_OR_DEPARTMENT_OTHER): Payer: Self-pay | Admitting: Obstetrics and Gynecology

## 2014-11-02 NOTE — Addendum Note (Signed)
Addendum  created 11/02/14 1408 by Suan Halter, CRNA   Modules edited: Anesthesia Events

## 2015-02-07 ENCOUNTER — Other Ambulatory Visit: Payer: Self-pay | Admitting: Obstetrics and Gynecology

## 2015-02-08 ENCOUNTER — Ambulatory Visit (HOSPITAL_COMMUNITY): Admission: RE | Admit: 2015-02-08 | Payer: 59 | Source: Ambulatory Visit

## 2015-02-08 LAB — CYTOLOGY - PAP

## 2015-03-16 ENCOUNTER — Telehealth: Payer: Self-pay | Admitting: Family Medicine

## 2015-03-16 NOTE — Telephone Encounter (Signed)
Pre Visit letter sent  °

## 2015-04-06 ENCOUNTER — Telehealth: Payer: Self-pay

## 2015-04-06 NOTE — Telephone Encounter (Signed)
Pt returning your call

## 2015-04-06 NOTE — Telephone Encounter (Signed)
LMOVM

## 2015-04-08 ENCOUNTER — Ambulatory Visit (INDEPENDENT_AMBULATORY_CARE_PROVIDER_SITE_OTHER): Payer: 59 | Admitting: Family Medicine

## 2015-04-08 ENCOUNTER — Encounter: Payer: Self-pay | Admitting: Family Medicine

## 2015-04-08 VITALS — BP 108/70 | HR 90 | Temp 98.0°F | Ht 67.0 in | Wt 160.6 lb

## 2015-04-08 DIAGNOSIS — Z0000 Encounter for general adult medical examination without abnormal findings: Secondary | ICD-10-CM | POA: Diagnosis not present

## 2015-04-08 NOTE — Progress Notes (Signed)
Subjective:     Sherry Jacobs is a 49 y.o. female and is here for a comprehensive physical exam. The patient reports no problems.  History   Social History  . Marital Status: Married    Spouse Name: N/A  . Number of Children: N/A  . Years of Education: N/A   Occupational History  . nurse [employer]   Social History Main Topics  . Smoking status: Never Smoker   . Smokeless tobacco: Never Used  . Alcohol Use: No  . Drug Use: No  . Sexual Activity:    Partners: Male   Other Topics Concern  . 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 COLONOSCOPY  02/04/2015  . HIV Screening  04/07/2016 (Originally 02/04/1981)  . INFLUENZA VACCINE  06/27/2015  . MAMMOGRAM  09/27/2015  . PAP SMEAR  02/06/2018  . 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 She  does not have any pertinent problems on file. She  has past surgical history that includes Appendectomy (1981); RIGHT MODIFIED NECK DISSECTION W/ REMOVAL BENIGN RIGHT PARAPHARYNGEAL SPACE MASS AND LYMPH NODES (08-02-2004); and Dilatation & curettage/hysteroscopy with myosure (N/A, 10/28/2014). Her family history includes Alcohol abuse in an other family member; Arthritis in an other family member; Breast cancer in her mother and paternal grandmother; Diabetes in an other family member; Hyperlipidemia in an other family member; Hypertension in an other family member; Melanoma in her maternal grandfather; Stroke in an other family member; Thyroid disease in an other family member; Uterine  cancer in an other family member. She  reports that she has never smoked. She has never used smokeless tobacco. She reports that she does not drink alcohol or use illicit drugs. She has a current medication list which includes the following prescription(s): alprazolam, hydroxychloroquine, multivitamin, norethindrone-ethinyl estradiol-fe biphas, and propranolol. Current Outpatient Prescriptions on File Prior to Visit  Medication Sig Dispense Refill  . hydroxychloroquine (PLAQUENIL) 200 MG tablet Take 200 mg by mouth daily. 200 mg in the Morning and 100 mg at bedtime    . Multiple Vitamin (MULTIVITAMIN) tablet Take 1 tablet by mouth daily.      . propranolol (INDERAL) 20 MG tablet Take 20 mg by mouth as needed.      No current facility-administered medications on file prior to visit.   She is allergic to sulfa antibiotics..  Review of Systems Review of Systems  Constitutional: Negative for activity change, appetite change and fatigue.  HENT: Negative for hearing loss, congestion, tinnitus and ear discharge.  dentist q72m Eyes: Negative for visual disturbance (see optho q1y -- vision corrected to 20/20 with glasses).  Respiratory: Negative for cough, chest tightness and shortness of breath.   Cardiovascular: Negative for chest pain, palpitations and leg swelling.  Gastrointestinal: Negative for abdominal pain, diarrhea, constipation and abdominal distention.  Genitourinary: Negative for urgency, frequency, decreased urine volume and difficulty urinating.  Musculoskeletal: Negative for back pain, arthralgias and gait problem.  Skin: Negative for color change, pallor  and rash.  Neurological: Negative for dizziness, light-headedness, numbness and headaches.  Hematological: Negative for adenopathy. Does not bruise/bleed easily.  Psychiatric/Behavioral: Negative for suicidal ideas, confusion, sleep disturbance, self-injury, dysphoric mood, decreased concentration and agitation.        Objective:    BP 108/70 mmHg  Pulse 90  Temp(Src) 98 F (36.7 C) (Oral)  Ht 5\' 7"  (1.702 m)  Wt 160 lb 9.6 oz (72.848 kg)  BMI 25.15 kg/m2  SpO2 98%  LMP 03/29/2015 General appearance: alert, cooperative, appears stated age and no distress Head: Normocephalic, without obvious abnormality, atraumatic Eyes: conjunctivae/corneas clear. PERRL, EOM's intact. Fundi benign. Ears: normal TM's and external ear canals both ears Nose: Nares normal. Septum midline. Mucosa normal. No drainage or sinus tenderness. Throat: lips, mucosa, and tongue normal; teeth and gums normal Neck: no adenopathy, no carotid bruit, no JVD, supple, symmetrical, trachea midline and thyroid not enlarged, symmetric, no tenderness/mass/nodules Back: symmetric, no curvature. ROM normal. No CVA tenderness. Lungs: clear to auscultation bilaterally Breasts: gyn Heart: S1, S2 normal Abdomen: soft, non-tender; bowel sounds normal; no masses,  no organomegaly Pelvic: deferred--gyn Extremities: extremities normal, atraumatic, no cyanosis or edema Pulses: 2+ and symmetric Skin: Skin color, texture, turgor normal. No rashes or lesions Lymph nodes: Cervical, supraclavicular, and axillary nodes normal. Neurologic: Alert and oriented X 3, normal strength and tone. Normal symmetric reflexes. Normal coordination and gait Psych- no depression, anxiety      Assessment:    Healthy female exam.      Plan:    ghm utd Check labs See After Visit Summary for Counseling Recommendations

## 2015-04-08 NOTE — Patient Instructions (Signed)
Preventive Care for Adults A healthy lifestyle and preventive care can promote health and wellness. Preventive health guidelines for women include the following key practices.  A routine yearly physical is a good way to check with your health care provider about your health and preventive screening. It is a chance to share any concerns and updates on your health and to receive a thorough exam.  Visit your dentist for a routine exam and preventive care every 6 months. Brush your teeth twice a day and floss once a day. Good oral hygiene prevents tooth decay and gum disease.  The frequency of eye exams is based on your age, health, family medical history, use of contact lenses, and other factors. Follow your health care provider's recommendations for frequency of eye exams.  Eat a healthy diet. Foods like vegetables, fruits, whole grains, low-fat dairy products, and lean protein foods contain the nutrients you need without too many calories. Decrease your intake of foods high in solid fats, added sugars, and salt. Eat the right amount of calories for you.Get information about a proper diet from your health care provider, if necessary.  Regular physical exercise is one of the most important things you can do for your health. Most adults should get at least 150 minutes of moderate-intensity exercise (any activity that increases your heart rate and causes you to sweat) each week. In addition, most adults need muscle-strengthening exercises on 2 or more days a week.  Maintain a healthy weight. The body mass index (BMI) is a screening tool to identify possible weight problems. It provides an estimate of body fat based on height and weight. Your health care provider can find your BMI and can help you achieve or maintain a healthy weight.For adults 20 years and older:  A BMI below 18.5 is considered underweight.  A BMI of 18.5 to 24.9 is normal.  A BMI of 25 to 29.9 is considered overweight.  A BMI of  30 and above is considered obese.  Maintain normal blood lipids and cholesterol levels by exercising and minimizing your intake of saturated fat. Eat a balanced diet with plenty of fruit and vegetables. Blood tests for lipids and cholesterol should begin at age 76 and be repeated every 5 years. If your lipid or cholesterol levels are high, you are over 50, or you are at high risk for heart disease, you may need your cholesterol levels checked more frequently.Ongoing high lipid and cholesterol levels should be treated with medicines if diet and exercise are not working.  If you smoke, find out from your health care provider how to quit. If you do not use tobacco, do not start.  Lung cancer screening is recommended for adults aged 22-80 years who are at high risk for developing lung cancer because of a history of smoking. A yearly low-dose CT scan of the lungs is recommended for people who have at least a 30-pack-year history of smoking and are a current smoker or have quit within the past 15 years. A pack year of smoking is smoking an average of 1 pack of cigarettes a day for 1 year (for example: 1 pack a day for 30 years or 2 packs a day for 15 years). Yearly screening should continue until the smoker has stopped smoking for at least 15 years. Yearly screening should be stopped for people who develop a health problem that would prevent them from having lung cancer treatment.  If you are pregnant, do not drink alcohol. If you are breastfeeding,  be very cautious about drinking alcohol. If you are not pregnant and choose to drink alcohol, do not have more than 1 drink per day. One drink is considered to be 12 ounces (355 mL) of beer, 5 ounces (148 mL) of wine, or 1.5 ounces (44 mL) of liquor.  Avoid use of street drugs. Do not share needles with anyone. Ask for help if you need support or instructions about stopping the use of drugs.  High blood pressure causes heart disease and increases the risk of  stroke. Your blood pressure should be checked at least every 1 to 2 years. Ongoing high blood pressure should be treated with medicines if weight loss and exercise do not work.  If you are 3-86 years old, ask your health care provider if you should take aspirin to prevent strokes.  Diabetes screening involves taking a blood sample to check your fasting blood sugar level. This should be done once every 3 years, after age 67, if you are within normal weight and without risk factors for diabetes. Testing should be considered at a younger age or be carried out more frequently if you are overweight and have at least 1 risk factor for diabetes.  Breast cancer screening is essential preventive care for women. You should practice "breast self-awareness." This means understanding the normal appearance and feel of your breasts and may include breast self-examination. Any changes detected, no matter how small, should be reported to a health care provider. Women in their 8s and 30s should have a clinical breast exam (CBE) by a health care provider as part of a regular health exam every 1 to 3 years. After age 70, women should have a CBE every year. Starting at age 25, women should consider having a mammogram (breast X-ray test) every year. Women who have a family history of breast cancer should talk to their health care provider about genetic screening. Women at a high risk of breast cancer should talk to their health care providers about having an MRI and a mammogram every year.  Breast cancer gene (BRCA)-related cancer risk assessment is recommended for women who have family members with BRCA-related cancers. BRCA-related cancers include breast, ovarian, tubal, and peritoneal cancers. Having family members with these cancers may be associated with an increased risk for harmful changes (mutations) in the breast cancer genes BRCA1 and BRCA2. Results of the assessment will determine the need for genetic counseling and  BRCA1 and BRCA2 testing.  Routine pelvic exams to screen for cancer are no longer recommended for nonpregnant women who are considered low risk for cancer of the pelvic organs (ovaries, uterus, and vagina) and who do not have symptoms. Ask your health care provider if a screening pelvic exam is right for you.  If you have had past treatment for cervical cancer or a condition that could lead to cancer, you need Pap tests and screening for cancer for at least 20 years after your treatment. If Pap tests have been discontinued, your risk factors (such as having a new sexual partner) need to be reassessed to determine if screening should be resumed. Some women have medical problems that increase the chance of getting cervical cancer. In these cases, your health care provider may recommend more frequent screening and Pap tests.  The HPV test is an additional test that may be used for cervical cancer screening. The HPV test looks for the virus that can cause the cell changes on the cervix. The cells collected during the Pap test can be  tested for HPV. The HPV test could be used to screen women aged 30 years and older, and should be used in women of any age who have unclear Pap test results. After the age of 30, women should have HPV testing at the same frequency as a Pap test.  Colorectal cancer can be detected and often prevented. Most routine colorectal cancer screening begins at the age of 50 years and continues through age 75 years. However, your health care provider may recommend screening at an earlier age if you have risk factors for colon cancer. On a yearly basis, your health care provider may provide home test kits to check for hidden blood in the stool. Use of a small camera at the end of a tube, to directly examine the colon (sigmoidoscopy or colonoscopy), can detect the earliest forms of colorectal cancer. Talk to your health care provider about this at age 50, when routine screening begins. Direct  exam of the colon should be repeated every 5-10 years through age 75 years, unless early forms of pre-cancerous polyps or small growths are found.  People who are at an increased risk for hepatitis B should be screened for this virus. You are considered at high risk for hepatitis B if:  You were born in a country where hepatitis B occurs often. Talk with your health care provider about which countries are considered high risk.  Your parents were born in a high-risk country and you have not received a shot to protect against hepatitis B (hepatitis B vaccine).  You have HIV or AIDS.  You use needles to inject street drugs.  You live with, or have sex with, someone who has hepatitis B.  You get hemodialysis treatment.  You take certain medicines for conditions like cancer, organ transplantation, and autoimmune conditions.  Hepatitis C blood testing is recommended for all people born from 1945 through 1965 and any individual with known risks for hepatitis C.  Practice safe sex. Use condoms and avoid high-risk sexual practices to reduce the spread of sexually transmitted infections (STIs). STIs include gonorrhea, chlamydia, syphilis, trichomonas, herpes, HPV, and human immunodeficiency virus (HIV). Herpes, HIV, and HPV are viral illnesses that have no cure. They can result in disability, cancer, and death.  You should be screened for sexually transmitted illnesses (STIs) including gonorrhea and chlamydia if:  You are sexually active and are younger than 24 years.  You are older than 24 years and your health care provider tells you that you are at risk for this type of infection.  Your sexual activity has changed since you were last screened and you are at an increased risk for chlamydia or gonorrhea. Ask your health care provider if you are at risk.  If you are at risk of being infected with HIV, it is recommended that you take a prescription medicine daily to prevent HIV infection. This is  called preexposure prophylaxis (PrEP). You are considered at risk if:  You are a heterosexual woman, are sexually active, and are at increased risk for HIV infection.  You take drugs by injection.  You are sexually active with a partner who has HIV.  Talk with your health care provider about whether you are at high risk of being infected with HIV. If you choose to begin PrEP, you should first be tested for HIV. You should then be tested every 3 months for as long as you are taking PrEP.  Osteoporosis is a disease in which the bones lose minerals and strength   with aging. This can result in serious bone fractures or breaks. The risk of osteoporosis can be identified using a bone density scan. Women ages 65 years and over and women at risk for fractures or osteoporosis should discuss screening with their health care providers. Ask your health care provider whether you should take a calcium supplement or vitamin D to reduce the rate of osteoporosis.  Menopause can be associated with physical symptoms and risks. Hormone replacement therapy is available to decrease symptoms and risks. You should talk to your health care provider about whether hormone replacement therapy is right for you.  Use sunscreen. Apply sunscreen liberally and repeatedly throughout the day. You should seek shade when your shadow is shorter than you. Protect yourself by wearing long sleeves, pants, a wide-brimmed hat, and sunglasses year round, whenever you are outdoors.  Once a month, do a whole body skin exam, using a mirror to look at the skin on your back. Tell your health care provider of new moles, moles that have irregular borders, moles that are larger than a pencil eraser, or moles that have changed in shape or color.  Stay current with required vaccines (immunizations).  Influenza vaccine. All adults should be immunized every year.  Tetanus, diphtheria, and acellular pertussis (Td, Tdap) vaccine. Pregnant women should  receive 1 dose of Tdap vaccine during each pregnancy. The dose should be obtained regardless of the length of time since the last dose. Immunization is preferred during the 27th-36th week of gestation. An adult who has not previously received Tdap or who does not know her vaccine status should receive 1 dose of Tdap. This initial dose should be followed by tetanus and diphtheria toxoids (Td) booster doses every 10 years. Adults with an unknown or incomplete history of completing a 3-dose immunization series with Td-containing vaccines should begin or complete a primary immunization series including a Tdap dose. Adults should receive a Td booster every 10 years.  Varicella vaccine. An adult without evidence of immunity to varicella should receive 2 doses or a second dose if she has previously received 1 dose. Pregnant females who do not have evidence of immunity should receive the first dose after pregnancy. This first dose should be obtained before leaving the health care facility. The second dose should be obtained 4-8 weeks after the first dose.  Human papillomavirus (HPV) vaccine. Females aged 13-26 years who have not received the vaccine previously should obtain the 3-dose series. The vaccine is not recommended for use in pregnant females. However, pregnancy testing is not needed before receiving a dose. If a female is found to be pregnant after receiving a dose, no treatment is needed. In that case, the remaining doses should be delayed until after the pregnancy. Immunization is recommended for any person with an immunocompromised condition through the age of 26 years if she did not get any or all doses earlier. During the 3-dose series, the second dose should be obtained 4-8 weeks after the first dose. The third dose should be obtained 24 weeks after the first dose and 16 weeks after the second dose.  Zoster vaccine. One dose is recommended for adults aged 60 years or older unless certain conditions are  present.  Measles, mumps, and rubella (MMR) vaccine. Adults born before 1957 generally are considered immune to measles and mumps. Adults born in 1957 or later should have 1 or more doses of MMR vaccine unless there is a contraindication to the vaccine or there is laboratory evidence of immunity to   each of the three diseases. A routine second dose of MMR vaccine should be obtained at least 28 days after the first dose for students attending postsecondary schools, health care workers, or international travelers. People who received inactivated measles vaccine or an unknown type of measles vaccine during 1963-1967 should receive 2 doses of MMR vaccine. People who received inactivated mumps vaccine or an unknown type of mumps vaccine before 1979 and are at high risk for mumps infection should consider immunization with 2 doses of MMR vaccine. For females of childbearing age, rubella immunity should be determined. If there is no evidence of immunity, females who are not pregnant should be vaccinated. If there is no evidence of immunity, females who are pregnant should delay immunization until after pregnancy. Unvaccinated health care workers born before 1957 who lack laboratory evidence of measles, mumps, or rubella immunity or laboratory confirmation of disease should consider measles and mumps immunization with 2 doses of MMR vaccine or rubella immunization with 1 dose of MMR vaccine.  Pneumococcal 13-valent conjugate (PCV13) vaccine. When indicated, a person who is uncertain of her immunization history and has no record of immunization should receive the PCV13 vaccine. An adult aged 19 years or older who has certain medical conditions and has not been previously immunized should receive 1 dose of PCV13 vaccine. This PCV13 should be followed with a dose of pneumococcal polysaccharide (PPSV23) vaccine. The PPSV23 vaccine dose should be obtained at least 8 weeks after the dose of PCV13 vaccine. An adult aged 19  years or older who has certain medical conditions and previously received 1 or more doses of PPSV23 vaccine should receive 1 dose of PCV13. The PCV13 vaccine dose should be obtained 1 or more years after the last PPSV23 vaccine dose.  Pneumococcal polysaccharide (PPSV23) vaccine. When PCV13 is also indicated, PCV13 should be obtained first. All adults aged 65 years and older should be immunized. An adult younger than age 65 years who has certain medical conditions should be immunized. Any person who resides in a nursing home or long-term care facility should be immunized. An adult smoker should be immunized. People with an immunocompromised condition and certain other conditions should receive both PCV13 and PPSV23 vaccines. People with human immunodeficiency virus (HIV) infection should be immunized as soon as possible after diagnosis. Immunization during chemotherapy or radiation therapy should be avoided. Routine use of PPSV23 vaccine is not recommended for American Indians, [place] Natives, or people younger than 65 years unless there are medical conditions that require PPSV23 vaccine. When indicated, people who have unknown immunization and have no record of immunization should receive PPSV23 vaccine. One-time revaccination 5 years after the first dose of PPSV23 is recommended for people aged 19-64 years who have chronic kidney failure, nephrotic syndrome, asplenia, or immunocompromised conditions. People who received 1-2 doses of PPSV23 before age 65 years should receive another dose of PPSV23 vaccine at age 65 years or later if at least 5 years have passed since the previous dose. Doses of PPSV23 are not needed for people immunized with PPSV23 at or after age 65 years.  Meningococcal vaccine. Adults with asplenia or persistent complement component deficiencies should receive 2 doses of quadrivalent meningococcal conjugate (MenACWY-D) vaccine. The doses should be obtained at least 2 months apart.  Microbiologists working with certain meningococcal bacteria, military recruits, people at risk during an outbreak, and people who travel to or live in countries with a high rate of meningitis should be immunized. A first-year college student up through age   21 years who is living in a residence hall should receive a dose if she did not receive a dose on or after her 16th birthday. Adults who have certain high-risk conditions should receive one or more doses of vaccine.  Hepatitis A vaccine. Adults who wish to be protected from this disease, have certain high-risk conditions, work with hepatitis A-infected animals, work in hepatitis A research labs, or travel to or work in countries with a high rate of hepatitis A should be immunized. Adults who were previously unvaccinated and who anticipate close contact with an international adoptee during the first 60 days after arrival in the [place] from a country with a high rate of hepatitis A should be immunized.  Hepatitis B vaccine. Adults who wish to be protected from this disease, have certain high-risk conditions, may be exposed to blood or other infectious body fluids, are household contacts or sex partners of hepatitis B positive people, are clients or workers in certain care facilities, or travel to or work in countries with a high rate of hepatitis B should be immunized.  Haemophilus influenzae type b (Hib) vaccine. A previously unvaccinated person with asplenia or sickle cell disease or having a scheduled splenectomy should receive 1 dose of Hib vaccine. Regardless of previous immunization, a recipient of a hematopoietic stem cell transplant should receive a 3-dose series 6-12 months after her successful transplant. Hib vaccine is not recommended for adults with HIV infection. Preventive Services / Frequency Ages 64 to 68 years  Blood pressure check.** / Every 1 to 2 years.  Lipid and cholesterol check.** / Every 5 years beginning at age  22.  Clinical breast exam.** / Every 3 years for women in their 88s and 53s.  BRCA-related cancer risk assessment.** / For women who have family members with a BRCA-related cancer (breast, ovarian, tubal, or peritoneal cancers).  Pap test.** / Every 2 years from ages 90 through 51. Every 3 years starting at age 21 through age 56 or 3 with a history of 3 consecutive normal Pap tests.  HPV screening.** / Every 3 years from ages 24 through ages 1 to 46 with a history of 3 consecutive normal Pap tests.  Hepatitis C blood test.** / For any individual with known risks for hepatitis C.  Skin self-exam. / Monthly.  Influenza vaccine. / Every year.  Tetanus, diphtheria, and acellular pertussis (Tdap, Td) vaccine.** / Consult your health care provider. Pregnant women should receive 1 dose of Tdap vaccine during each pregnancy. 1 dose of Td every 10 years.  Varicella vaccine.** / Consult your health care provider. Pregnant females who do not have evidence of immunity should receive the first dose after pregnancy.  HPV vaccine. / 3 doses over 6 months, if 72 and younger. The vaccine is not recommended for use in pregnant females. However, pregnancy testing is not needed before receiving a dose.  Measles, mumps, rubella (MMR) vaccine.** / You need at least 1 dose of MMR if you were born in 1957 or later. You may also need a 2nd dose. For females of childbearing age, rubella immunity should be determined. If there is no evidence of immunity, females who are not pregnant should be vaccinated. If there is no evidence of immunity, females who are pregnant should delay immunization until after pregnancy.  Pneumococcal 13-valent conjugate (PCV13) vaccine.** / Consult your health care provider.  Pneumococcal polysaccharide (PPSV23) vaccine.** / 1 to 2 doses if you smoke cigarettes or if you have certain conditions.  Meningococcal vaccine.** /  1 dose if you are age 19 to 21 years and a first-year college  student living in a residence hall, or have one of several medical conditions, you need to get vaccinated against meningococcal disease. You may also need additional booster doses.  Hepatitis A vaccine.** / Consult your health care provider.  Hepatitis B vaccine.** / Consult your health care provider.  Haemophilus influenzae type b (Hib) vaccine.** / Consult your health care provider. Ages 40 to 64 years  Blood pressure check.** / Every 1 to 2 years.  Lipid and cholesterol check.** / Every 5 years beginning at age 20 years.  Lung cancer screening. / Every year if you are aged 55-80 years and have a 30-pack-year history of smoking and currently smoke or have quit within the past 15 years. Yearly screening is stopped once you have quit smoking for at least 15 years or develop a health problem that would prevent you from having lung cancer treatment.  Clinical breast exam.** / Every year after age 40 years.  BRCA-related cancer risk assessment.** / For women who have family members with a BRCA-related cancer (breast, ovarian, tubal, or peritoneal cancers).  Mammogram.** / Every year beginning at age 40 years and continuing for as long as you are in good health. Consult with your health care provider.  Pap test.** / Every 3 years starting at age 30 years through age 65 or 70 years with a history of 3 consecutive normal Pap tests.  HPV screening.** / Every 3 years from ages 30 years through ages 65 to 70 years with a history of 3 consecutive normal Pap tests.  Fecal occult blood test (FOBT) of stool. / Every year beginning at age 50 years and continuing until age 75 years. You may not need to do this test if you get a colonoscopy every 10 years.  Flexible sigmoidoscopy or colonoscopy.** / Every 5 years for a flexible sigmoidoscopy or every 10 years for a colonoscopy beginning at age 50 years and continuing until age 75 years.  Hepatitis C blood test.** / For all people born from 1945 through  1965 and any individual with known risks for hepatitis C.  Skin self-exam. / Monthly.  Influenza vaccine. / Every year.  Tetanus, diphtheria, and acellular pertussis (Tdap/Td) vaccine.** / Consult your health care provider. Pregnant women should receive 1 dose of Tdap vaccine during each pregnancy. 1 dose of Td every 10 years.  Varicella vaccine.** / Consult your health care provider. Pregnant females who do not have evidence of immunity should receive the first dose after pregnancy.  Zoster vaccine.** / 1 dose for adults aged 60 years or older.  Measles, mumps, rubella (MMR) vaccine.** / You need at least 1 dose of MMR if you were born in 1957 or later. You may also need a 2nd dose. For females of childbearing age, rubella immunity should be determined. If there is no evidence of immunity, females who are not pregnant should be vaccinated. If there is no evidence of immunity, females who are pregnant should delay immunization until after pregnancy.  Pneumococcal 13-valent conjugate (PCV13) vaccine.** / Consult your health care provider.  Pneumococcal polysaccharide (PPSV23) vaccine.** / 1 to 2 doses if you smoke cigarettes or if you have certain conditions.  Meningococcal vaccine.** / Consult your health care provider.  Hepatitis A vaccine.** / Consult your health care provider.  Hepatitis B vaccine.** / Consult your health care provider.  Haemophilus influenzae type b (Hib) vaccine.** / Consult your health care provider. Ages 65   years and over  Blood pressure check.** / Every 1 to 2 years.  Lipid and cholesterol check.** / Every 5 years beginning at age 22 years.  Lung cancer screening. / Every year if you are aged 73-80 years and have a 30-pack-year history of smoking and currently smoke or have quit within the past 15 years. Yearly screening is stopped once you have quit smoking for at least 15 years or develop a health problem that would prevent you from having lung cancer  treatment.  Clinical breast exam.** / Every year after age 4 years.  BRCA-related cancer risk assessment.** / For women who have family members with a BRCA-related cancer (breast, ovarian, tubal, or peritoneal cancers).  Mammogram.** / Every year beginning at age 40 years and continuing for as long as you are in good health. Consult with your health care provider.  Pap test.** / Every 3 years starting at age 9 years through age 34 or 91 years with 3 consecutive normal Pap tests. Testing can be stopped between 65 and 70 years with 3 consecutive normal Pap tests and no abnormal Pap or HPV tests in the past 10 years.  HPV screening.** / Every 3 years from ages 57 years through ages 64 or 45 years with a history of 3 consecutive normal Pap tests. Testing can be stopped between 65 and 70 years with 3 consecutive normal Pap tests and no abnormal Pap or HPV tests in the past 10 years.  Fecal occult blood test (FOBT) of stool. / Every year beginning at age 15 years and continuing until age 17 years. You may not need to do this test if you get a colonoscopy every 10 years.  Flexible sigmoidoscopy or colonoscopy.** / Every 5 years for a flexible sigmoidoscopy or every 10 years for a colonoscopy beginning at age 86 years and continuing until age 71 years.  Hepatitis C blood test.** / For all people born from [birth year] through 1966 and any individual with known risks for hepatitis C.  Osteoporosis screening.** / A one-time screening for women ages 83 years and over and women at risk for fractures or osteoporosis.  Skin self-exam. / Monthly.  Influenza vaccine. / Every year.  Tetanus, diphtheria, and acellular pertussis (Tdap/Td) vaccine.** / 1 dose of Td every 10 years.  Varicella vaccine.** / Consult your health care provider.  Zoster vaccine.** / 1 dose for adults aged 61 years or older.  Pneumococcal 13-valent conjugate (PCV13) vaccine.** / Consult your health care provider.  Pneumococcal  polysaccharide (PPSV23) vaccine.** / 1 dose for all adults aged 28 years and older.  Meningococcal vaccine.** / Consult your health care provider.  Hepatitis A vaccine.** / Consult your health care provider.  Hepatitis B vaccine.** / Consult your health care provider.  Haemophilus influenzae type b (Hib) vaccine.** / Consult your health care provider. ** Family history and personal history of risk and conditions may change your health care provider's recommendations. Document Released: 01/08/2002 Document Revised: 03/29/2014 Document Reviewed: 04/09/2011 [facility] Patient Information 2015 [place], [place]. This information is not intended to replace advice given to you by your health care provider. Make sure you discuss any questions you have with your health care provider.

## 2015-04-08 NOTE — Progress Notes (Signed)
Pre visit review using our clinic review tool, if applicable. No additional management support is needed unless otherwise documented below in the visit note. 

## 2015-04-15 ENCOUNTER — Other Ambulatory Visit (INDEPENDENT_AMBULATORY_CARE_PROVIDER_SITE_OTHER): Payer: 59

## 2015-04-15 DIAGNOSIS — Z0000 Encounter for general adult medical examination without abnormal findings: Secondary | ICD-10-CM

## 2015-04-15 DIAGNOSIS — R8290 Unspecified abnormal findings in urine: Secondary | ICD-10-CM

## 2015-04-15 DIAGNOSIS — E559 Vitamin D deficiency, unspecified: Secondary | ICD-10-CM | POA: Diagnosis not present

## 2015-04-15 DIAGNOSIS — R319 Hematuria, unspecified: Secondary | ICD-10-CM

## 2015-04-15 LAB — LIPID PANEL
CHOLESTEROL: 190 mg/dL (ref 0–200)
HDL: 57.5 mg/dL (ref >39.00)
LDL CALC: 118 mg/dL — AB (ref 0–99)
NonHDL: 132.5
TRIGLYCERIDES: 75 mg/dL (ref 0.0–149.0)
Total CHOL/HDL Ratio: 3
VLDL: 15 mg/dL (ref 0.0–40.0)

## 2015-04-15 LAB — CBC WITH DIFFERENTIAL/PLATELET
BASOS ABS: 0 10*3/uL (ref 0.0–0.1)
Basophils Relative: 0.6 % (ref 0.0–3.0)
Eosinophils Absolute: 0 10*3/uL (ref 0.0–0.7)
Eosinophils Relative: 1 % (ref 0.0–5.0)
HCT: 36.7 % (ref 36.0–46.0)
Hemoglobin: 12.2 g/dL (ref 12.0–15.0)
LYMPHS PCT: 26.2 % (ref 12.0–46.0)
Lymphs Abs: 1.2 10*3/uL (ref 0.7–4.0)
MCHC: 33.2 g/dL (ref 30.0–36.0)
MCV: 77.4 fl — ABNORMAL LOW (ref 78.0–100.0)
MONOS PCT: 7.8 % (ref 3.0–12.0)
Monocytes Absolute: 0.4 10*3/uL (ref 0.1–1.0)
Neutro Abs: 3.1 10*3/uL (ref 1.4–7.7)
Neutrophils Relative %: 64.4 % (ref 43.0–77.0)
Platelets: 260 10*3/uL (ref 150.0–400.0)
RBC: 4.74 Mil/uL (ref 3.87–5.11)
RDW: 16.1 % — AB (ref 11.5–15.5)
WBC: 4.8 10*3/uL (ref 4.0–10.5)

## 2015-04-15 LAB — POCT URINALYSIS DIPSTICK
Bilirubin, UA: NEGATIVE
GLUCOSE UA: NEGATIVE
Ketones, UA: NEGATIVE
LEUKOCYTES UA: NEGATIVE
Nitrite, UA: NEGATIVE
Protein, UA: NEGATIVE
Spec Grav, UA: 1.025
Urobilinogen, UA: 0.2
pH, UA: 6

## 2015-04-15 LAB — TSH: TSH: 2.66 u[IU]/mL (ref 0.35–4.50)

## 2015-04-15 LAB — HEPATIC FUNCTION PANEL
ALT: 13 U/L (ref 0–35)
AST: 15 U/L (ref 0–37)
Albumin: 3.9 g/dL (ref 3.5–5.2)
Alkaline Phosphatase: 43 U/L (ref 39–117)
BILIRUBIN DIRECT: 0.1 mg/dL (ref 0.0–0.3)
BILIRUBIN TOTAL: 0.3 mg/dL (ref 0.2–1.2)
Total Protein: 6.9 g/dL (ref 6.0–8.3)

## 2015-04-15 LAB — BASIC METABOLIC PANEL
BUN: 10 mg/dL (ref 6–23)
CALCIUM: 9.2 mg/dL (ref 8.4–10.5)
CHLORIDE: 106 meq/L (ref 96–112)
CO2: 28 meq/L (ref 19–32)
CREATININE: 0.97 mg/dL (ref 0.40–1.20)
GFR: 64.56 mL/min (ref >60.00)
Glucose, Bld: 95 mg/dL (ref 70–99)
Potassium: 4.2 mEq/L (ref 3.5–5.1)
SODIUM: 139 meq/L (ref 135–145)

## 2015-04-15 LAB — VITAMIN D 25 HYDROXY (VIT D DEFICIENCY, FRACTURES): VITD: 49.08 ng/mL (ref 30.00–100.00)

## 2015-04-15 NOTE — Addendum Note (Signed)
Addended by: Harl Bowie on: 04/15/2015 09:35 AM   Modules accepted: Orders

## 2015-04-15 NOTE — Addendum Note (Signed)
Addended by: Harl Bowie on: 04/15/2015 09:14 AM   Modules accepted: Orders

## 2015-04-16 LAB — URINE CULTURE
Colony Count: NO GROWTH
Organism ID, Bacteria: NO GROWTH

## 2015-10-25 ENCOUNTER — Other Ambulatory Visit (HOSPITAL_COMMUNITY): Payer: Self-pay | Admitting: Obstetrics and Gynecology

## 2015-10-25 DIAGNOSIS — Z803 Family history of malignant neoplasm of breast: Secondary | ICD-10-CM

## 2015-11-25 ENCOUNTER — Ambulatory Visit (HOSPITAL_COMMUNITY): Payer: 59

## 2015-12-07 DIAGNOSIS — F411 Generalized anxiety disorder: Secondary | ICD-10-CM | POA: Diagnosis not present

## 2015-12-13 DIAGNOSIS — N83291 Other ovarian cyst, right side: Secondary | ICD-10-CM | POA: Diagnosis not present

## 2016-01-04 DIAGNOSIS — F411 Generalized anxiety disorder: Secondary | ICD-10-CM | POA: Diagnosis not present

## 2016-01-12 MED FILL — ALPRAZolam 0.25 MG TABS: 0.25 | 30 days supply | Qty: 120 | Fill #0

## 2016-01-31 MED FILL — FLUTICASONE PROP 0.005% OIN: 0.005 | 14 days supply | Qty: 30 | Fill #0

## 2016-02-01 DIAGNOSIS — F411 Generalized anxiety disorder: Secondary | ICD-10-CM | POA: Diagnosis not present

## 2016-02-01 MED FILL — TRETINOIN 0.025% CREAM: 0.025 | 10 days supply | Qty: 20 | Fill #0

## 2016-02-09 ENCOUNTER — Ambulatory Visit: Payer: 59 | Admitting: Podiatry

## 2016-02-15 DIAGNOSIS — F411 Generalized anxiety disorder: Secondary | ICD-10-CM | POA: Diagnosis not present

## 2016-02-21 ENCOUNTER — Ambulatory Visit (INDEPENDENT_AMBULATORY_CARE_PROVIDER_SITE_OTHER): Payer: 59

## 2016-02-21 ENCOUNTER — Encounter: Payer: Self-pay | Admitting: Podiatry

## 2016-02-21 ENCOUNTER — Ambulatory Visit (INDEPENDENT_AMBULATORY_CARE_PROVIDER_SITE_OTHER): Payer: 59 | Admitting: Podiatry

## 2016-02-21 VITALS — BP 100/53 | HR 75 | Resp 16

## 2016-02-21 DIAGNOSIS — M79672 Pain in left foot: Secondary | ICD-10-CM

## 2016-02-21 DIAGNOSIS — M722 Plantar fascial fibromatosis: Secondary | ICD-10-CM | POA: Diagnosis not present

## 2016-02-21 NOTE — Patient Instructions (Signed)

## 2016-02-21 NOTE — Progress Notes (Signed)
   Subjective:    Patient ID: Sherry Jacobs, female    DOB: 1966-06-23, 50 y.o.   MRN: [id number]  HPI: Sherry Jacobs presents today as a 50 year old white female with a chief complaint of a painful left heel. She states has been painful for the past 4 months and relates no injury. She states that she had taken a running class previously and injured one of her feet which she cannot remember the actual injury or even which foot it was. She continues to wear wedge-type shoes much like a Dansko. She states that she has no morning pain but her symptoms worsen as the day goes on. She has a history of a positive ANA for many years with positive joint pain as her ANA increases. She states that there is never been nuclear typing to diagnose rheumatoid, scleroderma or lupus.    Review of Systems  All other systems reviewed and are negative.      Objective:   Physical Exam: Vital signs are stable alert and oriented 3. Pulses are strongly palpable neurologic sensorium is intact. Muscle strength intact. Orthopedic evaluation demonstrates all joints distal to the ankle for range of motion without crepitation. She has pain on palpation medial calcaneal tubercle and of the plantar fascia just distal to its insertion site on the calcaneus. Radiographs do not demonstrate any type of osseus abnormalities in this area however it does demonstrate thickening of the plantar fascia at its insertion site on the heel. Otherwise no fractures were identified. Cutaneous evaluation demonstrates supple well-hydrated cutis no erythema edema cellulitis drainage or odor.        Assessment & Plan:  Assessment: Plantar fasciitis left foot.  Plan: We discussed the etiology pathology conservative versus surgical therapies. I offered her a Kenalog injection as well as a 6 day tapered dose of prednisone which would then be followed by a One-A-Day nonsteroidal anti-inflammatory. At this point since she has a history of elevated ANA but  no diagnosis with nuclear typing she would like to talk to her rheumatologist prior to any medical therapy. She will get back to [place] should her rheumatologist agree to medical therapy. We dispensed a plantar fascial brace and a night splint. We discussed appropriate shoe gear stretching exercises ice therapy and shoe gear modifications. Home to follow-up with her in the near future.  [facility] DPM, FACFAS

## 2016-02-27 DIAGNOSIS — M7071 Other bursitis of hip, right hip: Secondary | ICD-10-CM | POA: Diagnosis not present

## 2016-02-27 DIAGNOSIS — R300 Dysuria: Secondary | ICD-10-CM | POA: Diagnosis not present

## 2016-02-27 DIAGNOSIS — M359 Systemic involvement of connective tissue, unspecified: Secondary | ICD-10-CM | POA: Diagnosis not present

## 2016-02-27 DIAGNOSIS — M722 Plantar fascial fibromatosis: Secondary | ICD-10-CM | POA: Diagnosis not present

## 2016-02-27 DIAGNOSIS — Z09 Encounter for follow-up examination after completed treatment for conditions other than malignant neoplasm: Secondary | ICD-10-CM | POA: Diagnosis not present

## 2016-02-27 DIAGNOSIS — M2550 Pain in unspecified joint: Secondary | ICD-10-CM | POA: Diagnosis not present

## 2016-02-27 MED FILL — HYDROXYCHLOROQUINE 200 MG T: 200 | 90 days supply | Qty: 135 | Fill #0

## 2016-02-28 DIAGNOSIS — Z01419 Encounter for gynecological examination (general) (routine) without abnormal findings: Secondary | ICD-10-CM | POA: Diagnosis not present

## 2016-02-28 DIAGNOSIS — Z6826 Body mass index (BMI) 26.0-26.9, adult: Secondary | ICD-10-CM | POA: Diagnosis not present

## 2016-02-28 MED FILL — LO LOESTRIN FE 1-10 TABLET: 1 MG-10 MCG | 84 days supply | Qty: 84 | Fill #0

## 2016-03-20 ENCOUNTER — Ambulatory Visit: Payer: 59 | Admitting: Podiatry

## 2016-04-04 DIAGNOSIS — H524 Presbyopia: Secondary | ICD-10-CM | POA: Diagnosis not present

## 2016-04-04 DIAGNOSIS — H5203 Hypermetropia, bilateral: Secondary | ICD-10-CM | POA: Diagnosis not present

## 2016-04-09 ENCOUNTER — Encounter: Payer: 59 | Admitting: Family Medicine

## 2016-04-17 DIAGNOSIS — H04123 Dry eye syndrome of bilateral lacrimal glands: Secondary | ICD-10-CM | POA: Diagnosis not present

## 2016-04-17 DIAGNOSIS — Z79899 Other long term (current) drug therapy: Secondary | ICD-10-CM | POA: Diagnosis not present

## 2016-04-17 DIAGNOSIS — H40033 Anatomical narrow angle, bilateral: Secondary | ICD-10-CM | POA: Diagnosis not present

## 2016-04-17 LAB — HM DIABETES EYE EXAM

## 2016-04-24 ENCOUNTER — Encounter: Payer: Self-pay | Admitting: Family Medicine

## 2016-06-18 DIAGNOSIS — Z808 Family history of malignant neoplasm of other organs or systems: Secondary | ICD-10-CM | POA: Diagnosis not present

## 2016-06-18 DIAGNOSIS — L821 Other seborrheic keratosis: Secondary | ICD-10-CM | POA: Diagnosis not present

## 2016-06-18 DIAGNOSIS — Z86018 Personal history of other benign neoplasm: Secondary | ICD-10-CM | POA: Diagnosis not present

## 2016-07-25 ENCOUNTER — Encounter: Payer: Self-pay | Admitting: Family Medicine

## 2016-07-25 ENCOUNTER — Ambulatory Visit (INDEPENDENT_AMBULATORY_CARE_PROVIDER_SITE_OTHER): Payer: 59 | Admitting: Family Medicine

## 2016-07-25 VITALS — BP 102/68 | HR 73 | Temp 98.1°F | Wt 170.0 lb

## 2016-07-25 DIAGNOSIS — Z8619 Personal history of other infectious and parasitic diseases: Secondary | ICD-10-CM | POA: Diagnosis not present

## 2016-07-25 DIAGNOSIS — B354 Tinea corporis: Secondary | ICD-10-CM

## 2016-07-25 MED ORDER — KETOCONAZOLE 2 % EX CREA: 1.0000 "application " | TOPICAL_CREAM | Freq: Every day | CUTANEOUS | 0 refills | Status: AC

## 2016-07-25 MED FILL — KETOCONAZOLE 2% CREAM: 2 | 15 days supply | Qty: 30 | Fill #0

## 2016-07-25 NOTE — Progress Notes (Signed)
Chief Complaint  Patient presents with  . Rash    round, smooth cirlce on lower left calf, the rim was raised slightly.    Subjective: Patient is a 50 y.o. female here for a rash on her leg.  It has been there for 1 week. It was raised yesterday, but is now smooth. No pain or itching. She has not tried anything on it. No new soaps, lotions, detergents or topicals. No new medications or sick contacts. She does have a history of Lyme disease and is very concerned that it is a tick bite. She denies any recent travel, reports of a bite, or fevers.   ROS: Skin: +rash on calf  Family History  Problem Relation Age of Onset  . Diabetes    . Hypertension    . Alcohol abuse    . Hyperlipidemia    . Arthritis    . Breast cancer Paternal Grandmother   . Stroke    . Thyroid disease    . Melanoma Maternal Grandfather   . Breast cancer Mother   . Uterine cancer     Past Medical History:  Diagnosis Date  . ANA positive   . Arthralgia   . 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 Endometrial polyp   . IBS (irritable bowel syndrome)   . Iron deficiency anemia   . Lactose intolerance in adult   . PONV (postoperative nausea and vomiting)   . Social anxiety disorder   . Wears contact lenses    Allergies  Allergen Reactions  . Sulfa Antibiotics Nausea And Vomiting    Current Outpatient Prescriptions:  .  ALPRAZolam (XANAX) 0.25 MG tablet, Take 0.25 mg by mouth at bedtime as needed for anxiety., Disp: , Rfl:  .  hydroxychloroquine (PLAQUENIL) 200 MG tablet, Take 200 mg by mouth daily. 200 mg in the Morning and 100 mg at bedtime, Disp: , Rfl:  .  ketoconazole (NIZORAL) 2 % cream, Apply 1 application topically daily. Apply thin layer to left calf daily for 2 weeks., Disp: 15 g, Rfl: 0 .  Multiple Vitamin (MULTIVITAMIN) tablet, Take 1 tablet by mouth daily.  , Disp: , Rfl:  .  propranolol (INDERAL) 20 MG tablet, Take 20 mg by mouth as needed. , Disp: , Rfl:  .  tretinoin (RETIN-A) 0.025  % cream, , Disp: , Rfl: 3  Objective: BP 102/68 (BP Location: Left Arm, Patient Position: Sitting, Cuff Size: Normal)   Pulse 73   Temp 98.1 F (36.7 C) (Oral)   Wt 170 lb (77.1 kg)   SpO2 97%   BMI 26.63 kg/m  General: Awake, appears stated age HEENT: MMM, EOMi Heart:Brisk capillary refill Lungs: Normal effort Skin: On the posterior medial aspect of the left calf proximally, there is a 1.2 cm circular lesion that is erythematous with central area of clearing. I don't appreciate a target lesion, scaling, tenderness to palpation, drainage, or an opening. Psych: Age appropriate judgment and insight, normal affect and mood  Assessment and Plan: Tinea corporis - Plan: ketoconazole (NIZORAL) 2 % cream  History of Lyme disease - Plan: Lyme disease, western blot, HGE(IgG/M)+LymeAb(IgM)+RkyIgM  Orders as above. I explained to the patient that given her history and physical presentation, the likelihood of this being Lyme disease is very low. Given her personal history with this disease, she has a high level of anxiety and would like to have lab testing done. I did explain to her that this may not be covered, and she decided  that she would like them ordered and will decide later in the day if she would like to have them drawn. She will call our office and let us know if she changes her mind and does not want them. F/u in 2 weeks if symptoms fail or to improve. Will consider biopsy versus dermatology referral at that The patient voiced understanding and agreement to the plan.  Crosby Oyster [place]

## 2016-07-25 NOTE — Progress Notes (Signed)
Pre visit review using our clinic review tool, if applicable. No additional management support is needed unless otherwise documented below in the visit note. 

## 2016-07-25 NOTE — Patient Instructions (Addendum)

## 2016-08-02 ENCOUNTER — Other Ambulatory Visit (INDEPENDENT_AMBULATORY_CARE_PROVIDER_SITE_OTHER): Payer: 59

## 2016-08-02 DIAGNOSIS — Z8619 Personal history of other infectious and parasitic diseases: Secondary | ICD-10-CM

## 2016-08-17 ENCOUNTER — Encounter: Payer: Self-pay | Admitting: Family Medicine

## 2016-08-17 NOTE — Telephone Encounter (Signed)
Please advise      KP 

## 2016-08-28 ENCOUNTER — Ambulatory Visit: Payer: 59 | Admitting: Rheumatology

## 2016-10-04 MED FILL — ALPRAZolam 0.25 MG TABS: 0.25 | 30 days supply | Qty: 120 | Fill #0

## 2016-10-04 MED FILL — PROPRANOLOL 20 MG TABLET: 20 | 30 days supply | Qty: 90 | Fill #0

## 2016-10-24 ENCOUNTER — Ambulatory Visit: Payer: 59 | Admitting: Podiatry

## 2016-10-26 ENCOUNTER — Ambulatory Visit (INDEPENDENT_AMBULATORY_CARE_PROVIDER_SITE_OTHER): Payer: 59 | Admitting: Podiatry

## 2016-10-26 ENCOUNTER — Encounter: Payer: Self-pay | Admitting: Podiatry

## 2016-10-26 DIAGNOSIS — B070 Plantar wart: Secondary | ICD-10-CM | POA: Diagnosis not present

## 2016-10-28 NOTE — Progress Notes (Signed)
Subjective:     Patient ID: Sherry Jacobs, female   DOB: [birth year], 50 y.o.   MRN: [id number]  HPI patient presents stating she has quite a bit of discomfort with this lesion on the bottom of her left big toe and it makes it hard to walk comfortably   Review of Systems  All other systems reviewed and are negative.      Objective:   Physical Exam  Constitutional: She is oriented to person, place, and time.  Cardiovascular: Intact distal pulses.   Musculoskeletal: Normal range of motion.  Neurological: She is oriented to person, place, and time.  Skin: Skin is warm.  Nursing note and vitals reviewed.  neurovascular status intact muscle strength adequate range of motion within normal limits with lesion underneath the left big toe at the interphalangeal joint that upon debridement shows a lucent core but also some slight pinpoint bleeding. Patient states that it's gradually gotten more tender     Assessment:     Possibility for verruca plantaris versus porokeratotic type plantar lesion    Plan:     H&P and education rendered and today with sterile instrumentation did deep debridement of lesion and apply chemical agent to create an immune response. Gave instructions on physical therapy and reappoint to recheck

## 2016-11-10 DIAGNOSIS — Z803 Family history of malignant neoplasm of breast: Secondary | ICD-10-CM | POA: Diagnosis not present

## 2016-11-10 DIAGNOSIS — Z1231 Encounter for screening mammogram for malignant neoplasm of breast: Secondary | ICD-10-CM | POA: Diagnosis not present

## 2016-11-20 ENCOUNTER — Other Ambulatory Visit (HOSPITAL_COMMUNITY): Payer: Self-pay | Admitting: Obstetrics and Gynecology

## 2016-11-20 DIAGNOSIS — Z803 Family history of malignant neoplasm of breast: Secondary | ICD-10-CM

## 2016-11-27 ENCOUNTER — Other Ambulatory Visit: Payer: Self-pay | Admitting: Rheumatology

## 2016-11-27 NOTE — Telephone Encounter (Signed)
Patient is requesting refill of plaquenil be sent to [facility].

## 2016-12-03 NOTE — Telephone Encounter (Signed)
Patient states she will come for her labs next week. Patient states she still has some PLQ currently and had her PLQ eye exam May 2017 with [facility] and it was normal.

## 2016-12-31 ENCOUNTER — Encounter: Payer: Self-pay | Admitting: Family Medicine

## 2016-12-31 ENCOUNTER — Ambulatory Visit (INDEPENDENT_AMBULATORY_CARE_PROVIDER_SITE_OTHER): Payer: 59 | Admitting: Family Medicine

## 2016-12-31 VITALS — BP 122/70 | HR 82 | Temp 97.7°F | Resp 16 | Ht 66.8 in | Wt 177.8 lb

## 2016-12-31 DIAGNOSIS — Z0000 Encounter for general adult medical examination without abnormal findings: Secondary | ICD-10-CM

## 2016-12-31 NOTE — Progress Notes (Signed)
Pre visit review using our clinic review tool, if applicable. No additional management support is needed unless otherwise documented below in the visit note. Subjective:     Sherry Jacobs is a 51 y.o. female and is here for a comprehensive physical exam. The patient reports no problems.  Social History   Social History  . Marital status: Married    Spouse name: N/A  . Number of children: N/A  . Years of education: N/A   Occupational History  . nurse [employer]   Social History Main Topics  . Smoking status: Never Smoker  . Smokeless tobacco: Never Used  . Alcohol use No  . Drug use: No  . Sexual activity: Yes    Partners: Male   Other Topics Concern  . 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 COLONOSCOPY  02/04/2015  . MAMMOGRAM  09/26/2017  . PAP SMEAR  02/06/2018  . TETANUS/TDAP  12/15/2018  . INFLUENZA VACCINE  Completed  . HIV Screening  Completed    The following portions of the patient's history were reviewed and updated as appropriate:  She  has a past medical history of ANA positive; Arthralgia; Autoimmune disease ([place]); Endometrial polyp; IBS (irritable bowel syndrome); Iron deficiency anemia; Lactose intolerance in adult; PONV (postoperative nausea and vomiting); Social anxiety disorder; and Wears contact lenses. She  does not have any pertinent problems on file. She  has a past surgical history that includes Appendectomy (1981); RIGHT MODIFIED NECK DISSECTION W/ REMOVAL BENIGN RIGHT PARAPHARYNGEAL SPACE MASS AND LYMPH NODES (08-02-2004); and Dilatation & curettage/hysteroscopy with myosure (N/A, 10/28/2014). Her family history includes Breast cancer in her mother and paternal grandmother; Melanoma in her maternal grandfather. She  reports that she has never smoked. She has never used smokeless tobacco. She reports  that she does not drink alcohol or use drugs. She has a current medication list which includes the following prescription(s): alprazolam, hydroxychloroquine, multivitamin, propranolol, and tretinoin. Current Outpatient Prescriptions on File Prior to Visit  Medication Sig Dispense Refill  . ALPRAZolam (XANAX) 0.25 MG tablet Take 0.25 mg by mouth at bedtime as needed for anxiety.    . hydroxychloroquine (PLAQUENIL) 200 MG tablet Take 200 mg by mouth daily. 200 mg in the Morning and 100 mg at bedtime    . Multiple Vitamin (MULTIVITAMIN) tablet Take 1 tablet by mouth daily.      . propranolol (INDERAL) 20 MG tablet Take 20 mg by mouth as needed.     . tretinoin (RETIN-A) 0.025 % cream   3   No current facility-administered medications on file prior to visit.    She is allergic to sulfa antibiotics..  Review of Systems Review of Systems  Constitutional: Negative for activity change, appetite change and fatigue.  HENT: Negative for hearing loss, congestion, tinnitus and ear discharge.  dentist q46m Eyes: Negative for visual disturbance (see optho q1y -- vision corrected to 20/20 with glasses).  Respiratory: Negative for cough, chest tightness and shortness of breath.   Cardiovascular: Negative for chest pain, palpitations and leg swelling.  Gastrointestinal: Negative for abdominal pain, diarrhea, constipation and abdominal distention.  Genitourinary: Negative for urgency, frequency, decreased urine volume and difficulty urinating.  Musculoskeletal: Negative for back  pain, arthralgias and gait problem.  Skin: Negative for color change, pallor and rash.  Neurological: Negative for dizziness, light-headedness, numbness and headaches.  Hematological: Negative for adenopathy. Does not bruise/bleed easily.  Psychiatric/Behavioral: Negative for suicidal ideas, confusion, sleep disturbance, self-injury, dysphoric mood, decreased concentration and agitation.       Objective:    BP 122/70 (BP  Location: Right Arm, Cuff Size: Normal)   Pulse 82   Temp 97.7 F (36.5 C) (Oral)   Resp 16   Ht 5' 6.8" (1.697 m)   Wt 177 lb 12.8 oz (80.6 kg)   LMP 11/03/2016   SpO2 99%   BMI 28.01 kg/m  General appearance: alert, cooperative, appears stated age and no distress Head: Normocephalic, without obvious abnormality, atraumatic Eyes: conjunctivae/corneas clear. PERRL, EOM's intact. Fundi benign. Ears: normal TM's and external ear canals both ears Nose: Nares normal. Septum midline. Mucosa normal. No drainage or sinus tenderness. Throat: lips, mucosa, and tongue normal; teeth and gums normal Neck: no adenopathy, no carotid bruit, no JVD, supple, symmetrical, trachea midline and thyroid not enlarged, symmetric, no tenderness/mass/nodules Back: symmetric, no curvature. ROM normal. No CVA tenderness. Lungs: clear to auscultation bilaterally Breasts: gyn Heart: regular rate and rhythm, S1, S2 normal, no murmur, click, rub or gallop Abdomen: soft, non-tender; bowel sounds normal; no masses,  no organomegaly Pelvic: deferred --gyn Extremities: extremities normal, atraumatic, no cyanosis or edema Pulses: 2+ and symmetric Skin: Skin color, texture, turgor normal. No rashes or lesions Lymph nodes: Cervical, supraclavicular, and axillary nodes normal. Neurologic: Alert and oriented X 3, normal strength and tone. Normal symmetric reflexes. Normal coordination and gait    Assessment:    Healthy female exam.      Plan:    ghm utd Check labs See After Visit Summary for Counseling Recommendations    1. Preventative health care See above - Ambulatory referral to Gastroenterology - Comprehensive metabolic panel; Future - TSH; Future - CBC with Differential/Platelet; Future - Lipid panel; Future - POCT Urinalysis Dipstick (Automated); Future

## 2016-12-31 NOTE — Patient Instructions (Signed)

## 2017-01-01 ENCOUNTER — Other Ambulatory Visit (INDEPENDENT_AMBULATORY_CARE_PROVIDER_SITE_OTHER): Payer: 59

## 2017-01-01 DIAGNOSIS — Z0000 Encounter for general adult medical examination without abnormal findings: Secondary | ICD-10-CM | POA: Diagnosis not present

## 2017-01-01 LAB — POC URINALSYSI DIPSTICK (AUTOMATED)
Bilirubin, UA: NEGATIVE
Glucose, UA: NEGATIVE
Ketones, UA: NEGATIVE
Leukocytes, UA: NEGATIVE
Nitrite, UA: NEGATIVE
PROTEIN UA: NEGATIVE
RBC UA: NEGATIVE
SPEC GRAV UA: 1.02
UROBILINOGEN UA: NEGATIVE
pH, UA: 6

## 2017-01-02 ENCOUNTER — Encounter: Payer: Self-pay | Admitting: *Deleted

## 2017-01-02 ENCOUNTER — Encounter: Payer: Self-pay | Admitting: Rheumatology

## 2017-01-02 ENCOUNTER — Ambulatory Visit (INDEPENDENT_AMBULATORY_CARE_PROVIDER_SITE_OTHER): Payer: 59 | Admitting: Rheumatology

## 2017-01-02 VITALS — BP 100/68 | HR 70 | Resp 13 | Ht 67.0 in | Wt 176.0 lb

## 2017-01-02 DIAGNOSIS — R5383 Other fatigue: Secondary | ICD-10-CM | POA: Diagnosis not present

## 2017-01-02 DIAGNOSIS — Z79899 Other long term (current) drug therapy: Secondary | ICD-10-CM | POA: Diagnosis not present

## 2017-01-02 DIAGNOSIS — Z862 Personal history of diseases of the blood and blood-forming organs and certain disorders involving the immune mechanism: Secondary | ICD-10-CM | POA: Diagnosis not present

## 2017-01-02 DIAGNOSIS — M19042 Primary osteoarthritis, left hand: Secondary | ICD-10-CM | POA: Diagnosis not present

## 2017-01-02 DIAGNOSIS — M722 Plantar fascial fibromatosis: Secondary | ICD-10-CM

## 2017-01-02 DIAGNOSIS — M19041 Primary osteoarthritis, right hand: Secondary | ICD-10-CM

## 2017-01-02 DIAGNOSIS — Z0000 Encounter for general adult medical examination without abnormal findings: Secondary | ICD-10-CM

## 2017-01-02 DIAGNOSIS — M359 Systemic involvement of connective tissue, unspecified: Secondary | ICD-10-CM | POA: Diagnosis not present

## 2017-01-02 MED ORDER — HYDROXYCHLOROQUINE SULFATE 200 MG PO TABS: 200.0000 mg | ORAL_TABLET | Freq: Every day | ORAL | 1 refills | Status: DC

## 2017-01-02 NOTE — Progress Notes (Signed)
Office Visit Note  Patient: Sherry Jacobs             Date of Birth: 20-Nov-1966           MRN: [id number]             PCP: Ann Held, DO Referring: Ann Held, * Visit Date: 01/02/2017 Occupation: @GUAROCC @    Subjective:  Follow-up Follow-up on autoimmune disease and high-risk prescription  History of Present Illness: Sherry Jacobs is a 51 y.o. female  Last seen 02/27/2016.  Patient reports that she is doing really well. No joint pain swelling and stiffness, mild fatigue only. No arthralgia dry eyes dry mouth or Raynaud's symptoms.  She recently had a physical exam through her PCPs office. This Friday to go to be doing some blood work. We also need updated blood work and we will add our blood work to the one that Dr. Etter Sjogren is doing.  Patient's last Plaquenil eye exam has been received from our office and is being sent to scanning place. The eye exam was normal.  Patient is due for updated labs and she'll come in and this writing get done.  She needs a refill on Plaquenil   Activities of Daily Living:  Patient reports morning stiffness for 15 minutes.   Patient Denies nocturnal pain.  Difficulty dressing/grooming: Denies Difficulty climbing stairs: Denies Difficulty getting out of chair: Denies Difficulty using hands for taps, buttons, cutlery, and/or writing: Denies   No Rheumatology ROS completed.   PMFS History:  Patient Active Problem List   Diagnosis Date Noted  . High risk medication use 01/02/2017  . Right ankle pain 08/05/2013  . Cellulitis, toe 03/21/2012  . UNSPECIFIED VITAMIN D DEFICIENCY 02/02/2009  . 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 LEG PAIN, LEFT 10/28/2008  . ANXIETY 06/23/2008  . UNSPECIFIED OSTEOPOROSIS 12/29/2007  . HYPERLIPIDEMIA 10/02/2007  . INJURY, 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 09/09/2007  . Palpitations 09/09/2007  . ANA POSITIVE, HX OF 09/09/2007  . IRRITABLE BOWEL SYNDROME 04/11/2007  . FIBROMYALGIA 04/11/2007      Past Medical History:  Diagnosis Date  . ANA positive   . Arthralgia   . 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 Endometrial polyp   . IBS (irritable bowel syndrome)   . Iron deficiency anemia   . Lactose intolerance in adult   . PONV (postoperative nausea and vomiting)   . Social anxiety disorder   . Wears contact lenses     Family History  Problem Relation Age of Onset  . Breast cancer Mother   . Diabetes    . Hypertension    . Alcohol abuse    . Hyperlipidemia    . Arthritis    . Stroke    . Thyroid disease    . Uterine cancer    . Breast cancer Paternal Grandmother   . Melanoma Maternal Grandfather    Past Surgical History:  Procedure Laterality Date  . APPENDECTOMY  1981  . DILATATION & CURETTAGE/HYSTEROSCOPY WITH MYOSURE N/A 10/28/2014   Procedure: DILATATION & CURETTAGE/HYSTEROSCOPY WITH MYOSURE;  Surgeon: Marylynn Pearson, MD;  Location: [place];  Service: Gynecology;  Laterality: N/A;  . 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  No synovitis on examination  Investigation: No additional findings. Last labs are from 02/27/2016. CMP with GFR normal, CBC with differential is normal Sedimentation rate is negative C3-C4 normal ANA is positive with a titer of 1:320 and homogenous pattern Urinalysis is negative. We will order all of those  labs again this week and her PCP is ordered additional labs including CBC with differential and CMP with GFR. Her PCP also ordered lipid levels for which she'll need to be fasting. She'll come Friday morning to get those labs done.  Imaging: No results found.  Speciality Comments: No specialty comments available.    Procedures:  No procedures performed Allergies: Sulfa antibiotics   Assessment / Plan:     Visit Diagnoses: ANA POSITIVE, HX OF - Plan: ANA  Autoimmune disease ([place]) - Plan: ANA, C3 and C4, Urinalysis, Routine w reflex microscopic, Sedimentation rate, VITAMIN D 25 Hydroxy (Vit-D Deficiency, Fractures)  High risk medications (not anticoagulants) long-term use - Plan: ANA, C3 and C4, Urinalysis, Routine w reflex microscopic, Sedimentation rate, VITAMIN D 25 Hydroxy (Vit-D Deficiency, Fractures)  Plantar fasciitis  Fatigue, unspecified type - Plan: VITAMIN D 25 Hydroxy (Vit-D Deficiency, Fractures)  Primary osteoarthritis of both hands  Preventative health care - Plan: Comprehensive metabolic panel, TSH, CBC with Differential/Platelet, Lipid panel  Plan: #1: Patient autoimmune disease is doing well. Patient's plantar fasciitis is doing well #3 patient is not having too much greater trochanter bursa pain. She is aware of doing exercises and when she remembers to do them, she does.  #4: She'll need updated labs this Friday and she'll be fasting on that day to get the labs done. I'll release the ones from her PCPs office and added to the ones that we ordered today so they can be done on Friday. pcp's labs are:   CBC with Differential/Platelet    Comprehensive metabolic panel    Lipid panel    TSH    Ambulatory referral to Gastroenterology New Request    #5: Osteoarthritis. I advised the patient to do osteoarthritis supplements. We will start with omega-3 and tart Cherry. Patient is agreeable.  Orders: Orders Placed This Encounter  Procedures  . ANA  . C3  and C4  . Urinalysis, Routine w reflex microscopic  . Sedimentation rate  . VITAMIN D 25 Hydroxy (Vit-D Deficiency, Fractures)   No orders of the defined types were placed in this encounter.   Face-to-face time spent with patient was 30 minutes. 50% of time was spent in counseling and coordination of care.  Follow-Up Instructions: Return in about 6 months (around 07/02/2017) for A.D.;[id number];oaHands,right gr tr bursitis;.   Eliezer Lofts, PA-C  Note - This record has been created using [employer].  Chart creation errors have been sought, but may not always  have been located. Such creation errors do not reflect on  the standard of medical care.

## 2017-01-04 ENCOUNTER — Other Ambulatory Visit: Payer: 59

## 2017-01-04 ENCOUNTER — Telehealth (INDEPENDENT_AMBULATORY_CARE_PROVIDER_SITE_OTHER): Payer: Self-pay | Admitting: Rheumatology

## 2017-01-04 ENCOUNTER — Other Ambulatory Visit: Payer: Self-pay | Admitting: *Deleted

## 2017-01-04 DIAGNOSIS — Z79899 Other long term (current) drug therapy: Secondary | ICD-10-CM

## 2017-01-04 DIAGNOSIS — R300 Dysuria: Secondary | ICD-10-CM

## 2017-01-04 DIAGNOSIS — E559 Vitamin D deficiency, unspecified: Secondary | ICD-10-CM | POA: Diagnosis not present

## 2017-01-04 DIAGNOSIS — R5381 Other malaise: Secondary | ICD-10-CM

## 2017-01-04 DIAGNOSIS — M2550 Pain in unspecified joint: Secondary | ICD-10-CM | POA: Diagnosis not present

## 2017-01-04 DIAGNOSIS — R5383 Other fatigue: Secondary | ICD-10-CM

## 2017-01-04 LAB — CBC WITH DIFFERENTIAL/PLATELET
BASOS ABS: 44 {cells}/uL (ref 0–200)
BASOS PCT: 1 %
EOS PCT: 2 %
Eosinophils Absolute: 88 cells/uL (ref 15–500)
HCT: 39 % (ref 35.0–45.0)
Hemoglobin: 13.1 g/dL (ref 11.7–15.5)
LYMPHS ABS: 1188 {cells}/uL (ref 850–3900)
LYMPHS PCT: 27 %
MCH: 27.9 pg (ref 27.0–33.0)
MCHC: 33.6 g/dL (ref 32.0–36.0)
MCV: 83 fL (ref 80.0–100.0)
MONOS PCT: 10 %
MPV: 10.5 fL (ref 7.5–12.5)
Monocytes Absolute: 440 cells/uL (ref 200–950)
NEUTROS ABS: 2640 {cells}/uL (ref 1500–7800)
NEUTROS PCT: 60 %
PLATELETS: 244 10*3/uL (ref 140–400)
RBC: 4.7 MIL/uL (ref 3.80–5.10)
RDW: 14.3 % (ref 11.0–15.0)
WBC: 4.4 10*3/uL (ref 3.8–10.8)

## 2017-01-04 LAB — COMPLETE METABOLIC PANEL WITH GFR
ALT: 12 U/L (ref 6–29)
AST: 17 U/L (ref 10–35)
Albumin: 4.3 g/dL (ref 3.6–5.1)
Alkaline Phosphatase: 49 U/L (ref 33–130)
BUN: 18 mg/dL (ref 7–25)
CHLORIDE: 102 mmol/L (ref 98–110)
CO2: 27 mmol/L (ref 20–31)
Calcium: 9.3 mg/dL (ref 8.6–10.4)
Creat: 0.78 mg/dL (ref 0.50–1.05)
GFR, Est African American: >89 mL/min (ref >=60)
GFR, Est Non African American: 88 mL/min (ref >=60)
Glucose, Bld: 81 mg/dL (ref 65–99)
Potassium: 4.6 mmol/L (ref 3.5–5.3)
SODIUM: 137 mmol/L (ref 135–146)
Total Bilirubin: 0.4 mg/dL (ref 0.2–1.2)
Total Protein: 7 g/dL (ref 6.1–8.1)

## 2017-01-04 LAB — TSH: TSH: 1.56 mIU/L

## 2017-01-04 NOTE — Telephone Encounter (Signed)
Patient is responding well to Plaquenil 200 mg once a day.I accidentally put down 200 mg in the morning and 100 mg in the evening to refill.Please change that prescription so that she is taking 200 mg daily; ninety-day supply with 1 refill

## 2017-01-04 NOTE — Telephone Encounter (Signed)
Can you please verify the dose?

## 2017-01-04 NOTE — Telephone Encounter (Signed)
Need clarification on medication (PQL)  sent in by [facility]  Please call [phone].

## 2017-01-05 LAB — VITAMIN D 25 HYDROXY (VIT D DEFICIENCY, FRACTURES): Vit D, 25-Hydroxy: 29 ng/mL — ABNORMAL LOW (ref 30–100)

## 2017-01-05 LAB — URINALYSIS, ROUTINE W REFLEX MICROSCOPIC
Bilirubin Urine: NEGATIVE
Glucose, UA: NEGATIVE
Ketones, ur: NEGATIVE
LEUKOCYTES UA: NEGATIVE
NITRITE: NEGATIVE
PH: 7 (ref 5.0–8.0)
PROTEIN: NEGATIVE
Specific Gravity, Urine: 1.006 (ref 1.001–1.035)

## 2017-01-05 LAB — URINALYSIS, MICROSCOPIC ONLY
Bacteria, UA: NONE SEEN [HPF]
CASTS: NONE SEEN [LPF]
Crystals: NONE SEEN [HPF]
RBC / HPF: NONE SEEN RBC/HPF (ref <=2)
Squamous Epithelial / LPF: NONE SEEN [HPF] (ref <=5)
WBC, UA: NONE SEEN WBC/HPF (ref <=5)
YEAST: NONE SEEN [HPF]

## 2017-01-05 LAB — SEDIMENTATION RATE: SED RATE: 4 mm/h (ref 0–30)

## 2017-01-07 LAB — C3 AND C4
C3 COMPLEMENT: 120 mg/dL (ref 83–193)
C4 COMPLEMENT: 29 mg/dL (ref 15–57)

## 2017-01-07 LAB — ANTI-NUCLEAR AB-TITER (ANA TITER): ANA Titer 1: 1:320 {titer} — ABNORMAL HIGH

## 2017-01-07 LAB — ANA: ANA: POSITIVE — AB

## 2017-01-07 NOTE — Telephone Encounter (Signed)
[place] and clarified Prescription.

## 2017-01-10 ENCOUNTER — Telehealth: Payer: Self-pay | Admitting: Rheumatology

## 2017-01-10 MED ORDER — VITAMIN D (ERGOCALCIFEROL) 1.25 MG (50000 UNIT) PO CAPS: 50000.0000 [IU] | ORAL_CAPSULE | ORAL | 0 refills | Status: DC

## 2017-01-10 NOTE — Telephone Encounter (Signed)
Patient advised of lab results and prescription sent to the pharmacy.  

## 2017-01-10 NOTE — Telephone Encounter (Signed)
Patient returned Andrea's call. Please call patient.  

## 2017-01-10 NOTE — Telephone Encounter (Signed)
-----   Message from Eliezer Lofts, [place] sent at 01/07/2017  9:04 PM EST ----- I'm sending these labs to PCP And tell patient #1: CBC with differential, CMP with GFR, sedimentation rate, TSH, C3-C4, urinalysis are all normal  #2: ANA is positive with a titer of 1:320.  #3 urinalysis is normal except for 1+ hemoglobin in the urine (see PCP if UTI symptoms)  #4: Vitamin D is low at 29 with close to normal limits. (We can treat as follows: Vitamin D3 50,000 units once a day; dispense 12 pills with no refills) (patient will need repeat vitamin D in 3 months; please order)

## 2017-01-17 MED FILL — VIT D2 1.25 MG (50,000 UNIT: 1.25 MG | 90 days supply | Qty: 12 | Fill #0

## 2017-01-17 MED FILL — HYDROXYCHLOROQUINE 200 MG T: 200 | 90 days supply | Qty: 90 | Fill #0

## 2017-01-28 ENCOUNTER — Encounter: Payer: Self-pay | Admitting: Family Medicine

## 2017-02-06 DIAGNOSIS — H524 Presbyopia: Secondary | ICD-10-CM | POA: Diagnosis not present

## 2017-02-06 DIAGNOSIS — H5203 Hypermetropia, bilateral: Secondary | ICD-10-CM | POA: Diagnosis not present

## 2017-02-19 ENCOUNTER — Encounter: Payer: Self-pay | Admitting: Family Medicine

## 2017-02-19 ENCOUNTER — Ambulatory Visit (INDEPENDENT_AMBULATORY_CARE_PROVIDER_SITE_OTHER): Payer: 59 | Admitting: Family Medicine

## 2017-02-19 DIAGNOSIS — J069 Acute upper respiratory infection, unspecified: Secondary | ICD-10-CM | POA: Diagnosis not present

## 2017-02-19 DIAGNOSIS — S161XXA Strain of muscle, fascia and tendon at neck level, initial encounter: Secondary | ICD-10-CM

## 2017-02-19 MED ORDER — CETIRIZINE HCL 10 MG PO TABS: 10.0000 mg | ORAL_TABLET | Freq: Every day | ORAL | 2 refills | Status: DC

## 2017-02-19 MED ORDER — FLUTICASONE PROPIONATE 50 MCG/ACT NA SUSP: 2.0000 | Freq: Every day | NASAL | 2 refills | Status: DC

## 2017-02-19 NOTE — Progress Notes (Signed)
Pre visit review using our clinic review tool, if applicable. No additional management support is needed unless otherwise documented below in the visit note. 

## 2017-02-19 NOTE — Patient Instructions (Addendum)
Cervical Sprain A cervical sprain is a stretch or tear in one or more of the tough, cord-like tissues that connect bones (ligaments) in the neck. Cervical sprains can range from mild to severe. Severe cervical sprains can cause the spinal bones (vertebrae) in the neck to be unstable. This can lead to spinal cord damage and can result in serious nervous system problems. The amount of time that it takes for a cervical sprain to get better depends on the cause and extent of the injury. Most cervical sprains heal in 4-6 weeks. What are the causes? Cervical sprains may be caused by an injury (trauma), such as from a motor vehicle accident, a fall, or sudden forward and backward whipping movement of the head and neck (whiplash injury). Mild cervical sprains may be caused by wear and tear over time, such as from poor posture, sitting in a chair that does not provide support, or looking up or down for long periods of time. What increases the risk? The following factors may make you more likely to develop this condition:  Participating in activities that have a high risk of trauma to the neck. These include contact sports, auto racing, gymnastics, and diving.  Taking risks when driving or riding in a motor vehicle, such as speeding.  Having osteoarthritis of the spine.  Having poor strength and flexibility of the neck.  A previous neck injury.  Having poor posture.  Spending a lot of time in certain positions that put stress on the neck, such as sitting at a computer for long periods of time. What are the signs or symptoms? Symptoms of this condition include:  Pain, soreness, stiffness, tenderness, swelling, or a burning sensation in the front, back, or sides of the neck.  Sudden tightening of neck muscles that you cannot control (muscle spasms).  Pain in the shoulders or upper back.  Limited ability to move the neck.  Headache.  Dizziness.  Nausea.  Vomiting.  Weakness, numbness, or  tingling in a hand or an arm. Symptoms may develop right away after injury, or they may develop over a few days. In some cases, symptoms may go away with treatment and return (recur) over time. How is this diagnosed? This condition may be diagnosed based on:  Your medical history.  Your symptoms.  Any recent injuries or known neck problems that you have, such as arthritis in the neck.  A physical exam.  Imaging tests, such as:  X-rays.  MRI.  CT scan. How is this treated? This condition is treated by resting and icing the injured area and doing physical therapy exercises. Depending on the severity of your condition, treatment may also include:  Keeping your neck in place (immobilized) for periods of time. This may be done using:  A cervical collar. This supports your chin and the back of your head.  A cervical traction device. This is a sling that holds up your head. This removes weight and pressure from your neck, and it may help to relieve pain.  Medicines that help to relieve pain and inflammation.  Medicines that help to relax your muscles (muscle relaxants).  Surgery. This is rare. Follow these instructions at home: If you have a cervical collar:   Wear it as told by your health care provider. Do not remove the collar unless instructed by your health care provider.  Ask your health care provider before you make any adjustments to your collar.  If you have long hair, keep it outside of the collar.    Ask your health care provider if you can remove the collar for cleaning and bathing. If you are allowed to remove the collar for cleaning or bathing:  Follow instructions from your health care provider about how to remove the collar safely.  Clean the collar by wiping it with mild soap and water and drying it completely.  If your collar has removable pads, remove them every 1-2 days and wash them by hand with soap and water. Let them air-dry completely before you put  them back in the collar.  Check your skin under the collar for irritation or sores. If you see any, tell your health care provider. Managing pain, stiffness, and swelling   If directed, use a cervical traction device as told by your health care provider.  If directed, apply heat to the affected area before you do your physical therapy or as often as told by your health care provider. Use the heat source that your health care provider recommends, such as a moist heat pack or a heating pad.  Place a towel between your skin and the heat source.  Leave the heat on for 20-30 minutes.  Remove the heat if your skin turns bright red. This is especially important if you are unable to feel pain, heat, or cold. You may have a greater risk of getting burned.  If directed, put ice on the affected area:  Put ice in a plastic bag.  Place a towel between your skin and the bag.  Leave the ice on for 20 minutes, 2-3 times a day. Activity   Do not drive while wearing a cervical collar. If you do not have a cervical collar, ask your health care provider if it is safe to drive while your neck heals.  Do not drive or use heavy machinery while taking prescription pain medicine or muscle relaxants, unless your health care provider approves.  Do not lift anything that is heavier than 10 lb (4.5 kg) until your health care provider tells you that it is safe.  Rest as directed by your health care provider. Avoid positions and activities that make your symptoms worse. Ask your health care provider what activities are safe for you.  If physical therapy was prescribed, do exercises as told by your health care provider or physical therapist. General instructions   Take over-the-counter and prescription medicines only as told by your health care provider.  Do not use any products that contain nicotine or tobacco, such as cigarettes and e-cigarettes. These can delay healing. If you need help quitting, ask your  health care provider.  Keep all follow-up visits as told by your health care provider or physical therapist. This is important. How is this prevented? To prevent a cervical sprain from happening again:  Use and maintain good posture. Make any needed adjustments to your workstation to help you use good posture.  Exercise regularly as directed by your health care provider or physical therapist.  Avoid risky activities that may cause a cervical sprain. Contact a health care provider if:  You have symptoms that get worse or do not get better after 2 weeks of treatment.  You have pain that gets worse or does not get better with medicine.  You develop new, unexplained symptoms.  You have sores or irritated skin on your neck from wearing your cervical collar. Get help right away if:  You have severe pain.  You develop numbness, tingling, or weakness in any part of your body.  You cannot move   a part of your body (you have paralysis).  You have neck pain along with:  Severe dizziness.  Headache. Summary  A cervical sprain is a stretch or tear in one or more of the tough, cord-like tissues that connect bones (ligaments) in the neck.  Cervical sprains may be caused by an injury (trauma), such as from a motor vehicle accident, a fall, or sudden forward and backward whipping movement of the head and neck (whiplash injury).  Symptoms may develop right away after injury, or they may develop over a few days.  This condition is treated by resting and icing the injured area and doing physical therapy exercises. This information is not intended to replace advice given to you by your health care provider. Make sure you discuss any questions you have with your health care provider. Document Released: 09/09/2007 Document Revised: 07/11/2016 Document Reviewed: 07/11/2016 Elsevier Interactive Patient Education  2017 [employer]  

## 2017-02-19 NOTE — Assessment & Plan Note (Signed)
flonase and zyrtec rto prn

## 2017-02-19 NOTE — Assessment & Plan Note (Signed)
No pain today--- none since changing pillow Stretching Warm compresses rto prn

## 2017-02-19 NOTE — Progress Notes (Signed)
Patient ID: Sherry Jacobs, female   DOB: June 28, 1966, 51 y.o.   MRN: [id number]     Subjective:  I acted as a Education administrator for Dr. Carollee Herter.  Guerry Bruin, [place]   Patient ID: Sherry Jacobs, female    DOB: 07-15-1966, 51 y.o.   MRN: [id number]  Chief Complaint  Patient presents with  . Neck Pain    Neck Pain   This is a new problem. Episode onset: 3-4 weeks. The pain is associated with a sleep position. The pain is present in the right side. The symptoms are aggravated by position. Associated symptoms include headaches and tingling. Pertinent negatives include no chest pain or fever. Associated symptoms comments: Right ear and right side of nose feels congested in the mornings.  Right sided headache.. She has tried NSAIDs (ibuprofen) for the symptoms.    Patient is in today for neck pain.  Thinks it may be do to sleep.  She has changed her pillows.  Sleeps on her side.    Patient Care Team: Ann Held, DO as PCP - General   Past Medical History:  Diagnosis Date  . ANA positive   . Arthralgia   . 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 Endometrial polyp   . IBS (irritable bowel syndrome)   . Iron deficiency anemia   . Lactose intolerance in adult   . PONV (postoperative nausea and vomiting)   . Social anxiety disorder   . Wears contact lenses     Past Surgical History:  Procedure Laterality Date  . APPENDECTOMY  1981  . DILATATION & CURETTAGE/HYSTEROSCOPY WITH MYOSURE N/A 10/28/2014   Procedure: DILATATION & CURETTAGE/HYSTEROSCOPY WITH MYOSURE;  Surgeon: Marylynn Pearson, MD;  Location: [facility];  Service: Gynecology;  Laterality: N/A;  . RIGHT MODIFIED NECK DISSECTION W/ REMOVAL BENIGN RIGHT PARAPHARYNGEAL SPACE MASS AND LYMPH NODES  08-02-2004   benign bronchogenic cysts / negative lymph nodes    Family History  Problem Relation Age of Onset  . Breast cancer Mother   . Diabetes    . Hypertension    . Alcohol abuse    . Hyperlipidemia    .  Arthritis    . Stroke    . Thyroid disease    . Uterine cancer    . Breast cancer Paternal Grandmother   . Melanoma Maternal Grandfather     Social History   Social History  . Marital status: Married    Spouse name: N/A  . Number of children: N/A  . Years of education: N/A   Occupational History  . nurse [employer]   Social History Main Topics  . Smoking status: Never Smoker  . Smokeless tobacco: Never Used  . Alcohol use No  . Drug use: No  . Sexual activity: Yes    Partners: Male   Other Topics Concern  . 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 ALPRAZolam (XANAX) 0.25 MG tablet Take 0.25 mg by mouth at bedtime as needed for anxiety.    . hydroxychloroquine (PLAQUENIL) 200 MG tablet Take 1 tablet (200 mg total) by mouth daily. 200 mg in the Morning and 100 mg at  bedtime 180 tablet 1  . Multiple Vitamin (MULTIVITAMIN) tablet Take 1 tablet by mouth daily.      . propranolol (INDERAL) 20 MG tablet Take 20 mg by mouth as needed.     . tretinoin (RETIN-A) 0.025 % cream   3  . Vitamin D, Ergocalciferol, (DRISDOL) 50000 units CAPS capsule Take 1 capsule (50,000 Units total) by mouth every 7 (seven) days. 12 capsule 0   No facility-administered medications prior to visit.     Allergies  Allergen Reactions  . Sulfa Antibiotics Nausea And Vomiting    Review of Systems  Constitutional: Negative for fever and malaise/fatigue.  HENT: Negative for congestion.   Eyes: Negative for blurred vision.  Respiratory: Negative for cough and shortness of breath.   Cardiovascular: Negative for chest pain, palpitations and leg swelling.  Gastrointestinal: Negative for vomiting.  Musculoskeletal: Positive for neck pain. Negative for back pain.  Skin: Negative for rash.  Neurological: Positive for  tingling and headaches. Negative for loss of consciousness.       Objective:    Physical Exam  Constitutional: She is oriented to person, place, and time. She appears well-developed and well-nourished. No distress.  HENT:  Head: Normocephalic and atraumatic.  Right Ear: External ear normal.  Left Ear: External ear normal.  Nose: Nose normal.  Mouth/Throat: Oropharynx is clear and moist.  Eyes: Conjunctivae are normal.  Neck: Normal range of motion. Neck supple. No thyromegaly present.  Cardiovascular: Normal rate and regular rhythm.   Pulmonary/Chest: Effort normal and breath sounds normal. No respiratory distress. She has no wheezes. She has no rales. She exhibits no tenderness.  Abdominal: Soft. Bowel sounds are normal. There is no tenderness.  Musculoskeletal: Normal range of motion. She exhibits no edema or deformity.  Lymphadenopathy:    She has no cervical adenopathy.  Neurological: She is alert and oriented to person, place, and time.  Skin: Skin is warm and dry. She is not diaphoretic.  Psychiatric: She has a normal mood and affect. Her behavior is normal. Judgment and thought content normal.  Nursing note and vitals reviewed.   BP 120/78 (BP Location: Left Arm, Cuff Size: Normal)   Pulse 75   Temp 98.1 F (36.7 C) (Oral)   Resp 16   Ht 5\' 7"  (1.702 m)   Wt 170 lb 9.6 oz (77.4 kg)   LMP 01/01/2017   SpO2 98%   BMI 26.72 kg/m  Wt Readings from Last 3 Encounters:  02/19/17 170 lb 9.6 oz (77.4 kg)  01/02/17 176 lb (79.8 kg)  12/31/16 177 lb 12.8 oz (80.6 kg)   BP Readings from Last 3 Encounters:  02/19/17 120/78  01/02/17 100/68  12/31/16 122/70     Immunization History  Administered Date(s) Administered  . H1N1 10/13/2008  . Influenza Whole 11/26/2004, 09/15/2008, 08/30/2010  . Influenza-Unspecified 08/27/2011, 08/26/2016  . Pneumococcal Polysaccharide-23 05/11/2011  . Td 12/15/2008    Health Maintenance  Topic Date Due  . COLONOSCOPY  02/04/2015  .  MAMMOGRAM  09/26/2017  . PAP SMEAR  02/06/2018  . TETANUS/TDAP  12/15/2018  . INFLUENZA VACCINE  Completed  . HIV Screening  Completed    Lab Results  Component Value Date   WBC 4.4 01/04/2017   HGB 13.1 01/04/2017   HCT 39.0 01/04/2017   PLT 244 01/04/2017   GLUCOSE 81 01/04/2017   CHOL 190 04/15/2015   TRIG 75.0 04/15/2015   HDL 57.50 04/15/2015   LDLDIRECT 137.7 07/31/2013   LDLCALC 118 (H) 04/15/2015  ALT 12 01/04/2017   AST 17 01/04/2017   NA 137 01/04/2017   K 4.6 01/04/2017   CL 102 01/04/2017   CREATININE 0.78 01/04/2017   BUN 18 01/04/2017   CO2 27 01/04/2017   TSH 1.56 01/04/2017    Lab Results  Component Value Date   TSH 1.56 01/04/2017   Lab Results  Component Value Date   WBC 4.4 01/04/2017   HGB 13.1 01/04/2017   HCT 39.0 01/04/2017   MCV 83.0 01/04/2017   PLT 244 01/04/2017   Lab Results  Component Value Date   NA 137 01/04/2017   K 4.6 01/04/2017   CO2 27 01/04/2017   GLUCOSE 81 01/04/2017   BUN 18 01/04/2017   CREATININE 0.78 01/04/2017   BILITOT 0.4 01/04/2017   ALKPHOS 49 01/04/2017   AST 17 01/04/2017   ALT 12 01/04/2017   PROT 7.0 01/04/2017   ALBUMIN 4.3 01/04/2017   CALCIUM 9.3 01/04/2017   GFR 64.56 04/15/2015   Lab Results  Component Value Date   CHOL 190 04/15/2015   Lab Results  Component Value Date   HDL 57.50 04/15/2015   Lab Results  Component Value Date   LDLCALC 118 (H) 04/15/2015   Lab Results  Component Value Date   TRIG 75.0 04/15/2015   Lab Results  Component Value Date   CHOLHDL 3 04/15/2015   No results found for: HGBA1C       Assessment & Plan:   Problem List Items Addressed This Visit      Unprioritized   Neck strain    No pain today--- none since changing pillow Stretching Warm compresses rto prn      URI (upper respiratory infection)    flonase and zyrtec rto prn          I am having Ms. Kleinert start on cetirizine and fluticasone. I am also having her maintain her  multivitamin, propranolol, ALPRAZolam, tretinoin, hydroxychloroquine, and Vitamin D (Ergocalciferol).  Meds ordered this encounter  Medications  . cetirizine (ZYRTEC) 10 MG tablet    Sig: Take 1 tablet (10 mg total) by mouth daily.    Dispense:  90 tablet    Refill:  2  . fluticasone (FLONASE) 50 MCG/ACT nasal spray    Sig: Place 2 sprays into both nostrils daily.    Dispense:  16 g    Refill:  2    CMA served as scribe during this visit. History, Physical and Plan performed by medical provider. Documentation and orders reviewed and attested to.  Ann Held, DO

## 2017-03-04 DIAGNOSIS — F411 Generalized anxiety disorder: Secondary | ICD-10-CM | POA: Diagnosis not present

## 2017-03-07 DIAGNOSIS — Z01419 Encounter for gynecological examination (general) (routine) without abnormal findings: Secondary | ICD-10-CM | POA: Diagnosis not present

## 2017-03-07 DIAGNOSIS — Z6826 Body mass index (BMI) 26.0-26.9, adult: Secondary | ICD-10-CM | POA: Diagnosis not present

## 2017-03-18 MED FILL — FLUTICASONE PROP 50 MCG SPR: 50 | 30 days supply | Qty: 16 | Fill #0

## 2017-03-18 MED FILL — ALL DAY ALLERGY 10 MG TAB: 10 | 100 days supply | Qty: 100 | Fill #0

## 2017-03-19 DIAGNOSIS — R87615 Unsatisfactory cytologic smear of cervix: Secondary | ICD-10-CM | POA: Diagnosis not present

## 2017-05-17 DIAGNOSIS — L821 Other seborrheic keratosis: Secondary | ICD-10-CM | POA: Diagnosis not present

## 2017-05-17 DIAGNOSIS — Z86018 Personal history of other benign neoplasm: Secondary | ICD-10-CM | POA: Diagnosis not present

## 2017-05-17 DIAGNOSIS — D2230 Melanocytic nevi of unspecified part of face: Secondary | ICD-10-CM | POA: Diagnosis not present

## 2017-05-17 DIAGNOSIS — Z808 Family history of malignant neoplasm of other organs or systems: Secondary | ICD-10-CM | POA: Diagnosis not present

## 2017-05-17 DIAGNOSIS — D2221 Melanocytic nevi of right ear and external auricular canal: Secondary | ICD-10-CM | POA: Diagnosis not present

## 2017-05-17 DIAGNOSIS — D225 Melanocytic nevi of trunk: Secondary | ICD-10-CM | POA: Diagnosis not present

## 2017-05-30 MED FILL — ALPRAZolam 0.25 MG TABS: 0.25 | 30 days supply | Qty: 120 | Fill #0

## 2017-05-30 MED FILL — PROPRANOLOL 20 MG TABLET: 20 | 30 days supply | Qty: 90 | Fill #0

## 2017-06-03 ENCOUNTER — Encounter: Payer: Self-pay | Admitting: Family Medicine

## 2017-06-03 ENCOUNTER — Ambulatory Visit (HOSPITAL_BASED_OUTPATIENT_CLINIC_OR_DEPARTMENT_OTHER)
Admission: RE | Admit: 2017-06-03 | Discharge: 2017-06-03 | Disposition: A | Discharge status: Home / Self Care | Payer: 59 | Source: Ambulatory Visit | Attending: Family Medicine | Admitting: Family Medicine

## 2017-06-03 ENCOUNTER — Encounter: Payer: Self-pay | Admitting: Gastroenterology

## 2017-06-03 ENCOUNTER — Ambulatory Visit (INDEPENDENT_AMBULATORY_CARE_PROVIDER_SITE_OTHER): Payer: 59 | Admitting: Family Medicine

## 2017-06-03 VITALS — BP 116/70 | HR 107 | Temp 98.0°F | Resp 16 | Ht 67.0 in | Wt 163.6 lb

## 2017-06-03 DIAGNOSIS — N200 Calculus of kidney: Secondary | ICD-10-CM | POA: Insufficient documentation

## 2017-06-03 DIAGNOSIS — K429 Umbilical hernia without obstruction or gangrene: Secondary | ICD-10-CM | POA: Diagnosis not present

## 2017-06-03 DIAGNOSIS — K589 Irritable bowel syndrome without diarrhea: Secondary | ICD-10-CM | POA: Diagnosis not present

## 2017-06-03 DIAGNOSIS — R109 Unspecified abdominal pain: Secondary | ICD-10-CM | POA: Diagnosis not present

## 2017-06-03 LAB — CBC WITH DIFFERENTIAL/PLATELET
BASOS PCT: 0.7 % (ref 0.0–3.0)
Basophils Absolute: 0 10*3/uL (ref 0.0–0.1)
EOS ABS: 0 10*3/uL (ref 0.0–0.7)
Eosinophils Relative: 0.6 % (ref 0.0–5.0)
HCT: 40.6 % (ref 36.0–46.0)
Hemoglobin: 13.5 g/dL (ref 12.0–15.0)
LYMPHS ABS: 0.9 10*3/uL (ref 0.7–4.0)
Lymphocytes Relative: 20.2 % (ref 12.0–46.0)
MCHC: 33.2 g/dL (ref 30.0–36.0)
MCV: 83.6 fl (ref 78.0–100.0)
MONO ABS: 0.3 10*3/uL (ref 0.1–1.0)
Monocytes Relative: 7.4 % (ref 3.0–12.0)
NEUTROS ABS: 3 10*3/uL (ref 1.4–7.7)
NEUTROS PCT: 71.1 % (ref 43.0–77.0)
PLATELETS: 229 10*3/uL (ref 150.0–400.0)
RBC: 4.86 Mil/uL (ref 3.87–5.11)
RDW: 14.1 % (ref 11.5–15.5)
WBC: 4.3 10*3/uL (ref 4.0–10.5)

## 2017-06-03 LAB — AMYLASE: AMYLASE: 36 U/L (ref 27–131)

## 2017-06-03 LAB — POC URINALSYSI DIPSTICK (AUTOMATED)
Bilirubin, UA: NEGATIVE
Blood, UA: NEGATIVE
GLUCOSE UA: NEGATIVE
Ketones, UA: NEGATIVE
Leukocytes, UA: NEGATIVE
Nitrite, UA: NEGATIVE
Protein, UA: NEGATIVE
SPEC GRAV UA: 1.015 (ref 1.010–1.025)
UROBILINOGEN UA: 0.2 U/dL
pH, UA: 6 (ref 5.0–8.0)

## 2017-06-03 LAB — COMPREHENSIVE METABOLIC PANEL
ALT: 13 U/L (ref 0–35)
AST: 16 U/L (ref 0–37)
Albumin: 4.6 g/dL (ref 3.5–5.2)
Alkaline Phosphatase: 50 U/L (ref 39–117)
BUN: 16 mg/dL (ref 6–23)
CALCIUM: 10.3 mg/dL (ref 8.4–10.5)
CHLORIDE: 100 meq/L (ref 96–112)
CO2: 30 meq/L (ref 19–32)
CREATININE: 0.94 mg/dL (ref 0.40–1.20)
GFR: 66.38 mL/min (ref >60.00)
GLUCOSE: 149 mg/dL — AB (ref 70–99)
Potassium: 4.4 mEq/L (ref 3.5–5.1)
SODIUM: 137 meq/L (ref 135–145)
Total Bilirubin: 0.4 mg/dL (ref 0.2–1.2)
Total Protein: 7.5 g/dL (ref 6.0–8.3)

## 2017-06-03 LAB — LIPASE: LIPASE: 32 U/L (ref 11.0–59.0)

## 2017-06-03 MED ORDER — IOPAMIDOL (ISOVUE-300) INJECTION 61%
100.0000 mL | Freq: Once | INTRAVENOUS | Status: AC | PRN
  Administered 2017-06-03: 100 mL via INTRAVENOUS

## 2017-06-03 MED ORDER — OMEPRAZOLE 40 MG PO CPDR: 40.0000 mg | DELAYED_RELEASE_CAPSULE | Freq: Every day | ORAL | 3 refills | Status: DC

## 2017-06-03 MED FILL — OMEPRAZOLE DR 40 MG CAPSULE: 40 | 30 days supply | Qty: 30 | Fill #0

## 2017-06-03 NOTE — Progress Notes (Signed)
Patient ID: Sherry Jacobs, female   DOB: 16-Jun-1966, 51 y.o.   MRN: [id number]     Subjective:  I acted as a Education administrator for Dr. Carollee Herter.  Guerry Bruin, [place]   Patient ID: Sherry Jacobs, female    DOB: Mar 17, 1966, 51 y.o.   MRN: [id number]  Chief Complaint  Patient presents with  . Abdominal Pain    Abdominal Pain  This is a new problem. Episode onset: June 30th. The problem occurs intermittently. The pain is located in the LUQ, LLQ, RLQ and RUQ (been mostly left upper quadrant.  this morning pain on right side). The abdominal pain radiates to the back. Associated symptoms include belching and flatus. Pertinent negatives include no constipation, diarrhea, dysuria, fever, frequency, headaches, hematuria or vomiting. Associated symptoms comments: gurggling stomach, quezziness  . Treatments tried: ibuprofen.    Patient is in today for abdominal pain.  She tried tums with some relief and IB helped as well.  Pt c/o some queezy feeling.  She has been doing the next 52 day diet.    Patient Care Team: Carollee Herter, Alferd Apa, DO as PCP - General   Past Medical History:  Diagnosis Date  . ANA positive   . Arthralgia   . 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 Endometrial polyp   . IBS (irritable bowel syndrome)   . Iron deficiency anemia   . Lactose intolerance in adult   . PONV (postoperative nausea and vomiting)   . Social anxiety disorder   . Wears contact lenses     Past Surgical History:  Procedure Laterality Date  . APPENDECTOMY  1981  . DILATATION & CURETTAGE/HYSTEROSCOPY WITH MYOSURE N/A 10/28/2014   Procedure: DILATATION & CURETTAGE/HYSTEROSCOPY WITH MYOSURE;  Surgeon: Marylynn Pearson, MD;  Location: [facility];  Service: Gynecology;  Laterality: N/A;  . RIGHT MODIFIED NECK DISSECTION W/ REMOVAL BENIGN RIGHT PARAPHARYNGEAL SPACE MASS AND LYMPH NODES  08-02-2004   benign bronchogenic cysts / negative lymph nodes    Family History  Problem Relation Age of  Onset  . Breast cancer Mother   . Diabetes Unknown   . Hypertension Unknown   . Alcohol abuse Unknown   . Hyperlipidemia Unknown   . Arthritis Unknown   . Stroke Unknown   . Thyroid disease Unknown   . Uterine cancer Unknown   . Breast cancer Paternal Grandmother   . Melanoma Maternal Grandfather     Social History   Social History  . Marital status: Married    Spouse name: N/A  . Number of children: N/A  . Years of education: N/A   Occupational History  . nurse [employer]   Social History Main Topics  . Smoking status: Never Smoker  . Smokeless tobacco: Never Used  . Alcohol use No  . Drug use: No  . Sexual activity: Yes    Partners: Male   Other Topics Concern  . 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 ALPRAZolam (XANAX) 0.25 MG tablet Take 0.25 mg by mouth at bedtime as needed for anxiety.    . Multiple Vitamin (MULTIVITAMIN) tablet Take 1 tablet by mouth daily.      Marland Kitchen  propranolol (INDERAL) 20 MG tablet Take 20 mg by mouth as needed.     . tretinoin (RETIN-A) 0.025 % cream   3  . Vitamin D, Ergocalciferol, (DRISDOL) 50000 units CAPS capsule Take 1 capsule (50,000 Units total) by mouth every 7 (seven) days. 12 capsule 0  . cetirizine (ZYRTEC) 10 MG tablet Take 1 tablet (10 mg total) by mouth daily. 90 tablet 2  . fluticasone (FLONASE) 50 MCG/ACT nasal spray Place 2 sprays into both nostrils daily. 16 g 2  . hydroxychloroquine (PLAQUENIL) 200 MG tablet Take 1 tablet (200 mg total) by mouth daily. 200 mg in the Morning and 100 mg at bedtime (Patient taking differently: Take 200 mg by mouth daily. ) 180 tablet 1   No facility-administered medications prior to visit.     Allergies  Allergen Reactions  . Sulfa Antibiotics Nausea And Vomiting    Review of Systems  Constitutional:  Negative for fever and malaise/fatigue.  HENT: Negative for congestion.   Eyes: Negative for blurred vision.  Respiratory: Negative for cough and shortness of breath.   Cardiovascular: Negative for chest pain, palpitations and leg swelling.  Gastrointestinal: Positive for abdominal pain and flatus. Negative for constipation, diarrhea and vomiting.  Genitourinary: Negative for dysuria, frequency and hematuria.  Musculoskeletal: Negative for back pain.  Skin: Negative for rash.  Neurological: Negative for loss of consciousness and headaches.       Objective:    Physical Exam  Constitutional: She is oriented to person, place, and time. She appears well-developed and well-nourished.  HENT:  Head: Normocephalic and atraumatic.  Eyes: Conjunctivae and EOM are normal.  Neck: Normal range of motion. Neck supple. No JVD present. Carotid bruit is not present. No thyromegaly present.  Cardiovascular: Normal rate, regular rhythm and normal heart sounds.   No murmur heard. Pulmonary/Chest: Effort normal and breath sounds normal. No respiratory distress. She has no wheezes. She has no rales. She exhibits no tenderness.  Abdominal: Soft. She exhibits no mass. There is tenderness in the left upper quadrant and left lower quadrant. There is no rebound and no guarding.  Musculoskeletal: She exhibits no edema.  Neurological: She is alert and oriented to person, place, and time.  Psychiatric: She has a normal mood and affect. Her behavior is normal. Judgment and thought content normal.  Nursing note and vitals reviewed.   BP 116/70 (BP Location: Left Arm, Cuff Size: Normal)   Pulse (!) 107   Temp 98 F (36.7 C) (Oral)   Resp 16   Ht 5\' 7"  (1.702 m)   Wt 163 lb 9.6 oz (74.2 kg)   LMP 04/07/2017   SpO2 98%   BMI 25.62 kg/m  Wt Readings from Last 3 Encounters:  06/03/17 163 lb 9.6 oz (74.2 kg)  02/19/17 170 lb 9.6 oz (77.4 kg)  01/02/17 176 lb (79.8 kg)   BP Readings from Last 3 Encounters:    06/03/17 116/70  02/19/17 120/78  01/02/17 100/68     Immunization History  Administered Date(s) Administered  . H1N1 10/13/2008  . Influenza Whole 11/26/2004, 09/15/2008, 08/30/2010  . Influenza-Unspecified 08/27/2011, 08/26/2016  . Pneumococcal Polysaccharide-23 05/11/2011  . Td 12/15/2008    Health Maintenance  Topic Date Due  . COLONOSCOPY  02/04/2015  . INFLUENZA VACCINE  06/26/2017  . MAMMOGRAM  09/26/2017  . PAP SMEAR  02/06/2018  . TETANUS/TDAP  12/15/2018  . HIV Screening  Completed    Lab Results  Component Value Date   WBC 4.3 06/03/2017  HGB 13.5 06/03/2017   HCT 40.6 06/03/2017   PLT 229.0 06/03/2017   GLUCOSE 149 (H) 06/03/2017   CHOL 190 04/15/2015   TRIG 75.0 04/15/2015   HDL 57.50 04/15/2015   LDLDIRECT 137.7 07/31/2013   LDLCALC 118 (H) 04/15/2015   ALT 13 06/03/2017   AST 16 06/03/2017   NA 137 06/03/2017   K 4.4 06/03/2017   CL 100 06/03/2017   CREATININE 0.94 06/03/2017   BUN 16 06/03/2017   CO2 30 06/03/2017   TSH 1.56 01/04/2017    Lab Results  Component Value Date   TSH 1.56 01/04/2017   Lab Results  Component Value Date   WBC 4.3 06/03/2017   HGB 13.5 06/03/2017   HCT 40.6 06/03/2017   MCV 83.6 06/03/2017   PLT 229.0 06/03/2017   Lab Results  Component Value Date   NA 137 06/03/2017   K 4.4 06/03/2017   CO2 30 06/03/2017   GLUCOSE 149 (H) 06/03/2017   BUN 16 06/03/2017   CREATININE 0.94 06/03/2017   BILITOT 0.4 06/03/2017   ALKPHOS 50 06/03/2017   AST 16 06/03/2017   ALT 13 06/03/2017   PROT 7.5 06/03/2017   ALBUMIN 4.6 06/03/2017   CALCIUM 10.3 06/03/2017   GFR 66.38 06/03/2017   Lab Results  Component Value Date   CHOL 190 04/15/2015   Lab Results  Component Value Date   HDL 57.50 04/15/2015   Lab Results  Component Value Date   LDLCALC 118 (H) 04/15/2015   Lab Results  Component Value Date   TRIG 75.0 04/15/2015   Lab Results  Component Value Date   CHOLHDL 3 04/15/2015   No results found  for: HGBA1C       Assessment & Plan:   Problem List Items Addressed This Visit      Unprioritized   IRRITABLE BOWEL SYNDROME - Primary   Relevant Medications   omeprazole (PRILOSEC) 40 MG capsule   Other Relevant Orders   Ambulatory referral to Gastroenterology   CBC with Differential/Platelet (Completed)   Comprehensive metabolic panel (Completed)   POCT Urinalysis Dipstick (Automated) (Completed)   Amylase (Completed)   Lipase (Completed)    Other Visit Diagnoses    Left sided abdominal pain       Relevant Medications   omeprazole (PRILOSEC) 40 MG capsule   Other Relevant Orders   CT Abdomen Pelvis W Contrast (Completed)   CBC with Differential/Platelet (Completed)   Comprehensive metabolic panel (Completed)   POCT Urinalysis Dipstick (Automated) (Completed)   Amylase (Completed)   Lipase (Completed)      I have discontinued Ms. Machorro's cetirizine and fluticasone. I am also having her start on omeprazole. Additionally, I am having her maintain her multivitamin, propranolol, ALPRAZolam, tretinoin, Vitamin D (Ergocalciferol), and hydroxychloroquine.  Meds ordered this encounter  Medications  . hydroxychloroquine (PLAQUENIL) 200 MG tablet    Sig: Take 200 mg by mouth daily.  Marland Kitchen omeprazole (PRILOSEC) 40 MG capsule    Sig: Take 1 capsule (40 mg total) by mouth daily.    Dispense:  30 capsule    Refill:  3    CMA served as scribe during this visit. History, Physical and Plan performed by medical provider. Documentation and orders reviewed and attested to.  Ann Held, DO

## 2017-06-03 NOTE — Patient Instructions (Signed)

## 2017-06-06 ENCOUNTER — Telehealth: Payer: Self-pay | Admitting: Family Medicine

## 2017-06-06 MED FILL — HYDROXYCHLOROQUINE 200 MG T: 200 | 90 days supply | Qty: 90 | Fill #1

## 2017-06-06 NOTE — Telephone Encounter (Signed)
Relation to PJ:RPZP Call back [place]: [place], [place]  Reason for call:   Patient states she has not started omeprazole (PRILOSEC) 40 MG capsule. Patient states she researched medication and would like to start off at 20 MG and would like PCP opinion and requesting new Rx, please advise

## 2017-06-07 MED ORDER — OMEPRAZOLE 20 MG PO CPDR: 20.0000 mg | DELAYED_RELEASE_CAPSULE | Freq: Every day | ORAL | 3 refills | Status: DC

## 2017-06-07 MED FILL — OMEPRAZOLE DR 20 MG CAPSULE: 20 | 30 days supply | Qty: 30 | Fill #0

## 2017-06-07 NOTE — Telephone Encounter (Signed)
Fine-- do 20

## 2017-06-07 NOTE — Telephone Encounter (Signed)
Sent in to [employer] and notified the patient.

## 2017-07-10 MED FILL — OMEPRAZOLE DR 20 MG CAPSULE: 20 | 30 days supply | Qty: 30 | Fill #1

## 2017-07-18 ENCOUNTER — Encounter: Payer: Self-pay | Admitting: Rheumatology

## 2017-07-18 ENCOUNTER — Ambulatory Visit (INDEPENDENT_AMBULATORY_CARE_PROVIDER_SITE_OTHER): Payer: 59 | Admitting: Rheumatology

## 2017-07-18 VITALS — BP 114/69 | HR 76 | Resp 12 | Ht 67.0 in | Wt 170.0 lb

## 2017-07-18 DIAGNOSIS — E559 Vitamin D deficiency, unspecified: Secondary | ICD-10-CM | POA: Diagnosis not present

## 2017-07-18 DIAGNOSIS — D8989 Other specified disorders involving the immune mechanism, not elsewhere classified: Secondary | ICD-10-CM

## 2017-07-18 DIAGNOSIS — Z79899 Other long term (current) drug therapy: Secondary | ICD-10-CM | POA: Diagnosis not present

## 2017-07-18 DIAGNOSIS — Z862 Personal history of diseases of the blood and blood-forming organs and certain disorders involving the immune mechanism: Secondary | ICD-10-CM | POA: Diagnosis not present

## 2017-07-18 DIAGNOSIS — M359 Systemic involvement of connective tissue, unspecified: Secondary | ICD-10-CM

## 2017-07-18 MED ORDER — HYDROXYCHLOROQUINE SULFATE 200 MG PO TABS: ORAL_TABLET | ORAL | 1 refills | Status: DC

## 2017-07-18 NOTE — Patient Instructions (Signed)
    Use new fax number to fax this document to our office

## 2017-07-18 NOTE — Progress Notes (Signed)
Office Visit Note  Patient: Sherry Jacobs             Date of Birth: 1965-12-03           MRN: [id number]             PCP: Ann Held, DO Referring: Ann Held, * Visit Date: 07/18/2017 Occupation: @GUAROCC @    Subjective:  Fatigue   History of Present Illness: Sherry Jacobs is a 51 y.o. female  Was last seen in our office on 01/02/2017 and prior to that 02/27/2016. On the February 2018 visit, patient reported that she was doing well with her joints. No swelling stiffness or fatigue. She did not have any dry eyes, dry mouth or Raynaud's symptoms.  We ordered the following labs on the February visit to be done sometime in February/March 2018: ANA, C3-C4, Urinalysis, Sedimentation rate, Vitamin D==> and the results are as follows #1: CBC with differential, CMP with GFR, sedimentation rate, TSH, C3-C4, urinalysis are all normal #2: ANA is positive with a titer of 1:320. #3 urinalysis is normal except for 1+ hemoglobin in the urine (see PCP if UTI symptoms) #4: Vitamin D is low at 29 with close to normal limits. (We can treat as follows: Vitamin D3 50,000 units once a week; dispense 12 pills with no refills) (patient will need repeat vitamin D in 3 months; please order)  She had labs again in July 2018 at her PCPs office. There were forwarded to our office and are noted in patient's chart.  Today, pt is doing well w/ her A.D. Taking meds as Rx'd. 200 mg plq qd. She complains of left second MCP with pain when she flexes the left second MCP. Been occurring for the last 3 months. She hasn't missed any doses of her Plaquenil.  She is due for repeat Plaquenil eye exam November 2018.  Patient was treated with vitamin D 50,000 units once a week for 12 weeks. Return to do a repeat vitamin D today to make sure that her vitamin D has improved.      Activities of Daily Living:  Patient reports morning stiffness for 15 minutes.   Patient Denies nocturnal pain.   Difficulty dressing/grooming: Denies Difficulty climbing stairs: Denies Difficulty getting out of chair: Denies Difficulty using hands for taps, buttons, cutlery, and/or writing: Denies   Review of Systems  Constitutional: Negative for fatigue.  HENT: Negative for mouth sores and mouth dryness.   Eyes: Negative for dryness.  Respiratory: Negative for shortness of breath.   Gastrointestinal: Negative for constipation and diarrhea.  Musculoskeletal: Negative for myalgias and myalgias.  Skin: Negative for sensitivity to sunlight.  Psychiatric/Behavioral: Negative for decreased concentration and sleep disturbance.    PMFS History:  Patient Active Problem List   Diagnosis Date Noted  . URI (upper respiratory infection) 02/19/2017  . Neck strain 02/19/2017  . High risk medication use 01/02/2017  . Right ankle pain 08/05/2013  . Cellulitis, toe 03/21/2012  . UNSPECIFIED VITAMIN D DEFICIENCY 02/02/2009  . 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 LEG PAIN, LEFT 10/28/2008  . ANXIETY 06/23/2008  . UNSPECIFIED OSTEOPOROSIS 12/29/2007  . HYPERLIPIDEMIA 10/02/2007  . INJURY, 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 09/09/2007  . Palpitations 09/09/2007  . ANA POSITIVE, HX OF 09/09/2007  . IRRITABLE BOWEL SYNDROME 04/11/2007  . FIBROMYALGIA 04/11/2007    Past Medical History:  Diagnosis Date  . ANA positive   . Arthralgia   . 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  Endometrial polyp   . IBS (irritable bowel syndrome)   . Iron deficiency anemia   . Lactose intolerance in adult   . PONV (postoperative nausea and vomiting)   . Social anxiety disorder   . Wears contact lenses     Family History  Problem Relation Age of Onset  . Breast cancer Mother   . Diabetes Unknown   . Hypertension Unknown   . Alcohol abuse Unknown   . Hyperlipidemia Unknown   . Arthritis Unknown   . Stroke Unknown   . Thyroid disease Unknown   . Uterine cancer Unknown   . Breast cancer Paternal Grandmother   . Melanoma  Maternal Grandfather    Past Surgical History:  Procedure Laterality Date  . APPENDECTOMY  1981  . DILATATION & CURETTAGE/HYSTEROSCOPY WITH MYOSURE N/A 10/28/2014   Procedure: DILATATION & CURETTAGE/HYSTEROSCOPY WITH MYOSURE;  Surgeon: Marylynn Pearson, MD;  Location: [facility];  Service: Gynecology;  Laterality: N/A;  . 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 She appears well-developed and well-nourished.  HENT:  Head: Normocephalic and atraumatic.  Eyes: Pupils are equal, round, and reactive to light. EOM are normal.  Cardiovascular: Normal rate, regular rhythm and normal heart sounds.  Exam reveals no gallop and no friction rub.   No murmur heard. Pulmonary/Chest: Effort normal and breath sounds normal. She has no wheezes. She has no rales.  Abdominal: Soft. Bowel sounds are normal. She exhibits no distension. There is no tenderness. There is no guarding. No hernia.  Musculoskeletal: Normal range of motion. She exhibits no edema, tenderness or deformity.  Lymphadenopathy:    She has no cervical adenopathy.  Neurological: She is alert and oriented to person, place, and time. Coordination normal.  Skin: Skin is warm and dry. Capillary refill takes less than 2 seconds. No rash noted.  Psychiatric: She has a normal mood and affect. Her behavior is normal.  Nursing note and vitals reviewed.    Musculoskeletal Exam:  Full range of motion of all joints Grip strength is equal and strong  bilaterally Fiber myalgia tender points are all absent  CDAI Exam: CDAI Homunculus Exam:   Tenderness:  Left hand: 2nd MCP  Swelling:  Left hand: 2nd MCP  Joint Counts:  CDAI Tender Joint count: 1 CDAI Swollen Joint count: 1  Global Assessments:  Patient Global Assessment: 2 Provider Global Assessment: 2  CDAI Calculated Score: 6    Investigation: No additional findings. Office Visit on 06/03/2017  Component Date Value Ref Range Status  . WBC 06/03/2017 4.3  4.0 - 10.5 K/uL Final  . RBC 06/03/2017 4.86  3.87 - 5.11 Mil/uL Final  . Hemoglobin 06/03/2017 13.5  12.0 - 15.0 g/dL Final  . HCT 06/03/2017 40.6  36.0 - 46.0 % Final  . MCV 06/03/2017 83.6  78.0 -  100.0 fl Final  . MCHC 06/03/2017 33.2  30.0 - 36.0 g/dL Final  . RDW 06/03/2017 14.1  11.5 - 15.5 % Final  . Platelets 06/03/2017 229.0  150.0 - 400.0 K/uL Final  . Neutrophils Relative % 06/03/2017 71.1  43.0 - 77.0 % Final  . Lymphocytes Relative 06/03/2017 20.2  12.0 - 46.0 % Final  . Monocytes Relative 06/03/2017 7.4  3.0 - 12.0 % Final  . Eosinophils Relative 06/03/2017 0.6  0.0 - 5.0 % Final  . Basophils Relative 06/03/2017 0.7  0.0 - 3.0 % Final  . Neutro Abs 06/03/2017 3.0  1.4 - 7.7 K/uL Final  . Lymphs Abs 06/03/2017 0.9  0.7 - 4.0 K/uL Final  . Monocytes Absolute 06/03/2017 0.3  0.1 - 1.0 K/uL Final  . Eosinophils Absolute 06/03/2017 0.0  0.0 - 0.7 K/uL Final  . Basophils Absolute 06/03/2017 0.0  0.0 - 0.1 K/uL Final  . Sodium 06/03/2017 137  135 - 145 mEq/L Final  . Potassium 06/03/2017 4.4  3.5 - 5.1 mEq/L Final  . Chloride 06/03/2017 100  96 - 112 mEq/L Final  . CO2 06/03/2017 30  19 - 32 mEq/L Final  . Glucose, Bld 06/03/2017 149* 70 - 99 mg/dL Final  . BUN 06/03/2017 16  6 - 23 mg/dL Final  . Creatinine, Ser 06/03/2017 0.94  0.40 - 1.20 mg/dL Final  . Total Bilirubin 06/03/2017 0.4  0.2 - 1.2 mg/dL Final  . Alkaline Phosphatase 06/03/2017 50  39 - 117 U/L Final  . AST 06/03/2017 16  0 - 37 U/L  Final  . ALT 06/03/2017 13  0 - 35 U/L Final  . Total Protein 06/03/2017 7.5  6.0 - 8.3 g/dL Final  . Albumin 06/03/2017 4.6  3.5 - 5.2 g/dL Final  . Calcium 06/03/2017 10.3  8.4 - 10.5 mg/dL Final  . GFR 06/03/2017 66.38  >60.00 mL/min Final  . Color, UA 06/03/2017 yellow   Final  . Clarity, UA 06/03/2017 clear   Final  . Glucose, UA 06/03/2017 neg   Final  . Bilirubin, UA 06/03/2017 neg   Final  . Ketones, UA 06/03/2017 neg   Final  . Spec Grav, UA 06/03/2017 1.015  1.010 - 1.025 Final  . Blood, UA 06/03/2017 neg   Final  . pH, UA 06/03/2017 6.0  5.0 - 8.0 Final  . Protein, UA 06/03/2017 neg   Final  . Urobilinogen, UA 06/03/2017 0.2  0.2 or 1.0 E.U./dL Final  . Nitrite, UA 06/03/2017 neg   Final  . Leukocytes, UA 06/03/2017 Negative  Negative Final  . Amylase 06/03/2017 36  27 - 131 U/L Final  . Lipase 06/03/2017 32.0  11.0 - 59.0 U/L Final     Imaging: No results found.  Speciality Comments: No specialty comments available.    Procedures:  No procedures performed Allergies: Sulfa antibiotics   Assessment / Plan:     Visit Diagnoses: Autoimmune disease ([place])  High risk medications (not anticoagulants) long-term use  ANA POSITIVE, HX OF  Vitamin D deficiency - Plan: VITAMIN D 25 Hydroxy (Vit-D Deficiency, Fractures)   Plan: #1: Autoimmune disease. History of positive ANA with elevated titers of 1:640 with a homogenous pattern when patient is off of Plaquenil. (Please see 03/05/2013 note). History of positive Ro Today, patient is doing well with her autoimmune disease except she has left second MCP with pain for the last 3 months. It is slightly swollen. Not warm. No previous history of this. Dr. Estanislado Pandy reviewed it and  will increase the Plaquenil to 200 mg twice a day Monday through Friday only. We will rewrite the prescription so she can get 120 pills for 90 days with a refill  Her Plaquenil eye exam will be due November 2018. She had a Plaquenil eye exam done  last November 2017 and it was normal but we do not have the documentation. Patient states that she will call them once again so that they can send [place] the documentation. She reports that the eye exam was normal.  #2: High risk prescription Patient is taking Plaquenil 200 mg daily x 7 days/ week  Plaquenil eye exam was normal on the February 2018 visit. Patient brought the Plaquenil eye exam to our office and it was sent to scanned place. Reviewing the patient's chart, I am unable to find the Plaquenil documentation. Verbally, patient told me that her Plaquenil eye exam was normal. It will be due again nov 2018   I will rewrite the prescription so that patient can take Plaquenil 200 mg twice a day Monday through Friday  #3: History of OA of the hands DIP PIP prominence bilaterally. Occasional mild discomfort Currently, she is having left second MCP with tenderness and possible mild synovitis. We will increase her Plaquenil and see if it resolves. Otherwise we will plan to do an ultrasound.  #4: History of right greater trochanteric bursitis All better now. Foley resolve.  #5: Return to clinic in 3 months In about 3 months from July labs(nov), she will need repeat labs that include CBC with differential, CMP with GFR, urinalysis.   Orders: Orders Placed This Encounter  Procedures  . VITAMIN D 25 Hydroxy (Vit-D Deficiency, Fractures)   Meds ordered this encounter  Medications  . DISCONTD: hydroxychloroquine (PLAQUENIL) 200 MG tablet    Sig: 200 mg twice a day Monday through Friday; none on Saturday or Sunday.    Dispense:  120 tablet    Refill:  1    Order Specific Question:   Supervising Provider    Answer:   Bo Merino [[id number]]  . hydroxychloroquine (PLAQUENIL) 200 MG tablet    Sig: 200 mg twice a day Monday through Friday; none on Saturday or Sunday.    Dispense:  120 tablet    Refill:  1    Order Specific Question:   Supervising Provider    Answer:   Bo Merino [phone]    Face-to-face time spent with patient was 30 minutes. 50% of time was spent in counseling and coordination of care.  Follow-Up Instructions: Return in about 3 months (around 10/18/2017) for sle/plq 200mg  bid,oahands,vit d defic.Marland Kitchen   Bo Merino, MD I examined and evaluated the patient with Eliezer Lofts PA.Patient is having mild flare with increased joint swelling. After examining her we decided to increase her Plaquenil dose. The plan of care was discussed as noted above.  Bo Merino, MD Note - This record has been created using Editor, commissioning.  Chart creation errors have been sought, but may not always  have been located. Such creation errors do not reflect on  the standard of medical care.

## 2017-07-18 NOTE — Addendum Note (Signed)
Addended byEliezer Lofts on: 07/18/2017 10:18 AM   Modules accepted: Orders

## 2017-07-19 ENCOUNTER — Encounter: Payer: Self-pay | Admitting: Gastroenterology

## 2017-07-19 ENCOUNTER — Ambulatory Visit (INDEPENDENT_AMBULATORY_CARE_PROVIDER_SITE_OTHER): Payer: 59 | Admitting: Gastroenterology

## 2017-07-19 VITALS — BP 114/82 | Ht 67.0 in | Wt 167.5 lb

## 2017-07-19 DIAGNOSIS — K581 Irritable bowel syndrome with constipation: Secondary | ICD-10-CM

## 2017-07-19 DIAGNOSIS — Z1211 Encounter for screening for malignant neoplasm of colon: Secondary | ICD-10-CM | POA: Diagnosis not present

## 2017-07-19 DIAGNOSIS — R1012 Left upper quadrant pain: Secondary | ICD-10-CM | POA: Diagnosis not present

## 2017-07-19 DIAGNOSIS — R140 Abdominal distension (gaseous): Secondary | ICD-10-CM

## 2017-07-19 LAB — VITAMIN D 25 HYDROXY (VIT D DEFICIENCY, FRACTURES): VIT D 25 HYDROXY: 35 ng/mL (ref 30–100)

## 2017-07-19 NOTE — Patient Instructions (Signed)
If you are age 52 or older, your body mass index should be between 23-30. Your Body mass index is 26.23 kg/m. If this is out of the aforementioned range listed, please consider follow up with your Primary Care Provider.  If you are age 28 or younger, your body mass index should be between 19-25. Your Body mass index is 26.23 kg/m. If this is out of the aformentioned range listed, please consider follow up with your Primary Care Provider.   You have been scheduled for a colonoscopy. Please follow written instructions given to you at your visit today.  Please pick up your prep supplies at the pharmacy within the next 1-3 days. If you use inhalers (even only as needed), please bring them with you on the day of your procedure. Your physician has requested that you go to www.startemmi.com and enter the access code given to you at your visit today. This web site gives a general overview about your procedure. However, you should still follow specific instructions given to you by our office regarding your preparation for the procedure.  Thank you for choosing [facility]  Dr Wilfrid Lund III

## 2017-07-19 NOTE — Progress Notes (Signed)
[facility] Consult Note:  History: Sherry Jacobs 07/19/2017  Referring physician: Carollee Herter, Alferd Apa, DO  Reason for consult/chief complaint: Abdominal Pain (Left sided. Pt feels that Prilosec 20mg  qd has been helpful. She has been taking med since July) and Irritable Bowel Syndrome (H/O for years. Maintains with diet only)   Subjective  HPI:  This is a 51 year old woman referred by primary care noted above for left upper quadrant pain. She describes decades of constipation predominant IBS that she felt is under reasonably good control with diet. Earlier this year she went on a diet low in carbohydrates and additive/preservatives, and says she had considerable improvement in her abdominal bloating in bowel habits. In about June she went off that diet because she had friends visiting and she was traveling during the summer. With that, she started having left upper quadrant pain that would radiate down the left side, no urinary symptoms, no nausea or vomiting or change in bowel habits or rectal bleeding. She was not having heartburn, but primary care put her on Prilosec 20 mg a day and there was symptom improvement. It is not clear if it happened from the Prilosec or because she went back to her previous diet plan. At this point, she feels well and just had some concerns because she has also never had a screening colonoscopy.  ROS:  Review of Systems  Constitutional: Negative for appetite change and unexpected weight change.  HENT: Negative for mouth sores and voice change.   Eyes: Negative for pain and redness.  Respiratory: Negative for cough and shortness of breath.   Cardiovascular: Negative for chest pain and palpitations.  Genitourinary: Negative for dysuria and hematuria.  Musculoskeletal: Positive for arthralgias. Negative for myalgias.  Skin: Negative for pallor and rash.  Neurological: Negative for weakness and headaches.  Hematological: Negative for  adenopathy.   She reports having an unspecified rheumatologic condition with the persistently positive ANA and a full rheumatologic workup, myalgias and arthralgias are the main symptom.  Past Medical History: Past Medical History:  Diagnosis Date  . ANA positive   . Arthralgia   . 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 Endometrial polyp   . IBS (irritable bowel syndrome)   . Iron deficiency anemia   . Lactose intolerance in adult   . PONV (postoperative nausea and vomiting)   . Social anxiety disorder   . Wears contact lenses      Past Surgical History: Past Surgical History:  Procedure Laterality Date  . APPENDECTOMY  1981  . DILATATION & CURETTAGE/HYSTEROSCOPY WITH MYOSURE N/A 10/28/2014   Procedure: DILATATION & CURETTAGE/HYSTEROSCOPY WITH MYOSURE;  Surgeon: Marylynn Pearson, MD;  Location: [facility];  Service: Gynecology;  Laterality: N/A;  . RIGHT MODIFIED NECK DISSECTION W/ REMOVAL BENIGN RIGHT PARAPHARYNGEAL SPACE MASS AND LYMPH NODES  08-02-2004   benign bronchogenic cysts / negative lymph nodes     Family History: Family History  Problem Relation Age of Onset  . Breast cancer Mother   . Colon polyps Father   . Diabetes Unknown   . Hypertension Unknown   . Alcohol abuse Unknown   . Hyperlipidemia Unknown   . Arthritis Unknown   . Stroke Unknown   . Thyroid disease Unknown   . Uterine cancer Unknown   . Breast cancer Paternal Grandmother   . Melanoma Maternal Grandfather     Social History: Social History   Social History  . Marital status: Married  Spouse name: N/A  . Number of children: 2  . Years of education: N/A   Occupational History  . nurse [employer]   Social History Main Topics  . Smoking status: Never Smoker  . Smokeless tobacco: Never Used  . Alcohol use No  . Drug use: No  . Sexual activity: Yes    Partners: Male   Other Topics Concern  . 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 Sulfa Antibiotics Nausea And Vomiting    Outpatient Meds: Current Outpatient Prescriptions  Medication Sig Dispense Refill  . ALPRAZolam (XANAX) 0.25 MG tablet Take 0.25 mg by mouth at bedtime as needed for anxiety.    . hydroxychloroquine (PLAQUENIL) 200 MG tablet 200 mg twice a day Monday through Friday; none on Saturday or Sunday. 120 tablet 1  . Multiple Vitamin (MULTIVITAMIN) tablet Take 1 tablet by mouth daily.      Marland Kitchen omeprazole (PRILOSEC) 20 MG capsule Take 1 capsule (20 mg total) by mouth daily. 30 capsule 3  . propranolol (INDERAL) 20 MG tablet Take 20 mg by mouth as needed.     . tretinoin (RETIN-A) 0.025 % cream   3   No current facility-administered medications for this visit.     Rarely takes the Xanax or propranolol  ___________________________________________________________________ Objective   Exam:  BP 114/82   Ht 5\' 7"  (1.702 m)   Wt 167 lb 8 oz (76 kg)   BMI 26.23 kg/m    General: this is a(n) well-appearing woman   Eyes: sclera anicteric, no redness  ENT: oral mucosa moist without lesions, no cervical or supraclavicular lymphadenopathy, good dentition  CV: RRR without murmur, S1/S2, no JVD, no peripheral edema  Resp: clear to auscultation bilaterally, normal RR and effort noted  GI: soft, no tenderness, with active bowel sounds. No guarding or palpable organomegaly noted.  Skin; warm and dry, no rash or jaundice noted  Neuro: awake, alert and oriented x 3. Normal gross motor function and fluent speech  Labs:  CBC Latest Ref Rng & Units 06/03/2017 01/04/2017 04/15/2015  WBC 4.0 - 10.5 K/uL 4.3 4.4 4.8  Hemoglobin 12.0 - 15.0 g/dL 13.5 13.1 12.2  Hematocrit 36.0 - 46.0 % 40.6 39.0 36.7  Platelets 150.0 - 400.0 K/uL 229.0 244 260.0   CMP Latest Ref Rng & Units 06/03/2017 01/04/2017  04/15/2015  Glucose 70 - 99 mg/dL 149(H) 81 95  BUN 6 - 23 mg/dL 16 18 10   Creatinine 0.40 - 1.20 mg/dL 0.94 0.78 0.97  Sodium 135 - 145 mEq/L 137 137 139  Potassium 3.5 - 5.1 mEq/L 4.4 4.6 4.2  Chloride 96 - 112 mEq/L 100 102 106  CO2 19 - 32 mEq/L 30 27 28   Calcium 8.4 - 10.5 mg/dL 10.3 9.3 9.2  Total Protein 6.0 - 8.3 g/dL 7.5 7.0 6.9  Total Bilirubin 0.2 - 1.2 mg/dL 0.4 0.4 0.3  Alkaline Phos 39 - 117 U/L 50 49 43  AST 0 - 37 U/L 16 17 15   ALT 0 - 35 U/L 13 12 13      Radiologic Studies:  CTAP 7/9 - tiny non-obstructing left kidney stones  Assessment: Encounter Diagnoses  Name Primary?  Marland Kitchen  LUQ pain Yes  . Irritable bowel syndrome with constipation   . Abdominal bloating   . Special screening for malignant neoplasms, colon     Her left upper quadrant pain seems to been an evolution of her IBS, perhaps triggered by going off her special diet plan. She is now feeling well, and I felt she could safely wean off the Prilosec over the next 2 weeks. I gave her plan to do that. Her underlying symptoms do seem typical for IBS.  She is in need of a screening colonoscopy, and was agreeable to scheduling that today.  The benefits and risks of the planned procedure were described in detail with the patient or (when appropriate) their health care proxy.  Risks were outlined as including, but not limited to, bleeding, infection, perforation, adverse medication reaction leading to cardiac or pulmonary decompensation, or pancreatitis (if ERCP).  The limitation of incomplete mucosal visualization was also discussed.  No guarantees or warranties were given.   Thank you for the courtesy of this consult.  Please call me with any questions or concerns.  Nelida Meuse III  CC: Ann Held, DO

## 2017-07-22 ENCOUNTER — Ambulatory Visit: Payer: 59 | Admitting: Rheumatology

## 2017-08-14 DIAGNOSIS — H04123 Dry eye syndrome of bilateral lacrimal glands: Secondary | ICD-10-CM | POA: Diagnosis not present

## 2017-08-14 DIAGNOSIS — H40033 Anatomical narrow angle, bilateral: Secondary | ICD-10-CM | POA: Diagnosis not present

## 2017-08-14 DIAGNOSIS — Z79899 Other long term (current) drug therapy: Secondary | ICD-10-CM | POA: Diagnosis not present

## 2017-09-06 ENCOUNTER — Encounter: Payer: Self-pay | Admitting: Gastroenterology

## 2017-09-13 MED FILL — HYDROXYCHLOROQUINE 200 MG T: 200 | 84 days supply | Qty: 120 | Fill #0

## 2017-09-18 ENCOUNTER — Encounter: Payer: 59 | Admitting: Gastroenterology

## 2017-09-23 MED FILL — FLUTICASONE PROP 0.005% OIN: 0.005 | 14 days supply | Qty: 60 | Fill #0

## 2017-10-28 MED FILL — PROPRANOLOL 20 MG TABLET: 20 | 30 days supply | Qty: 90 | Fill #1

## 2017-10-28 MED FILL — CEPHALEXIN 500 MG CAPSULE: 500 | 7 days supply | Qty: 21 | Fill #0

## 2017-10-28 MED FILL — ALPRAZolam 0.25 MG TABS: 0.25 | 30 days supply | Qty: 120 | Fill #0

## 2017-11-08 ENCOUNTER — Telehealth: Payer: Self-pay | Admitting: Gastroenterology

## 2017-11-11 ENCOUNTER — Encounter: Payer: 59 | Admitting: Gastroenterology

## 2017-11-11 NOTE — Telephone Encounter (Signed)
Not this time, happy holidays. Please reschedule at convenient time - short notice cancellation is a wasted spot.

## 2017-11-20 ENCOUNTER — Ambulatory Visit: Payer: 59 | Admitting: Rheumatology

## 2017-11-20 ENCOUNTER — Ambulatory Visit (INDEPENDENT_AMBULATORY_CARE_PROVIDER_SITE_OTHER): Payer: 59

## 2017-11-20 ENCOUNTER — Encounter: Payer: Self-pay | Admitting: Rheumatology

## 2017-11-20 ENCOUNTER — Other Ambulatory Visit: Payer: Self-pay | Admitting: *Deleted

## 2017-11-20 ENCOUNTER — Other Ambulatory Visit: Payer: Self-pay

## 2017-11-20 VITALS — BP 111/76 | HR 75 | Resp 15 | Ht 67.0 in | Wt 178.0 lb

## 2017-11-20 DIAGNOSIS — Z8639 Personal history of other endocrine, nutritional and metabolic disease: Secondary | ICD-10-CM

## 2017-11-20 DIAGNOSIS — I7300 Raynaud's syndrome without gangrene: Secondary | ICD-10-CM | POA: Diagnosis not present

## 2017-11-20 DIAGNOSIS — M25551 Pain in right hip: Secondary | ICD-10-CM | POA: Diagnosis not present

## 2017-11-20 DIAGNOSIS — Z8659 Personal history of other mental and behavioral disorders: Secondary | ICD-10-CM

## 2017-11-20 DIAGNOSIS — D8989 Other specified disorders involving the immune mechanism, not elsewhere classified: Secondary | ICD-10-CM | POA: Diagnosis not present

## 2017-11-20 DIAGNOSIS — Z8719 Personal history of other diseases of the digestive system: Secondary | ICD-10-CM | POA: Diagnosis not present

## 2017-11-20 DIAGNOSIS — Z79899 Other long term (current) drug therapy: Secondary | ICD-10-CM

## 2017-11-20 DIAGNOSIS — M7061 Trochanteric bursitis, right hip: Secondary | ICD-10-CM

## 2017-11-20 DIAGNOSIS — M797 Fibromyalgia: Secondary | ICD-10-CM

## 2017-11-20 DIAGNOSIS — M7062 Trochanteric bursitis, left hip: Secondary | ICD-10-CM

## 2017-11-20 DIAGNOSIS — M3500 Sicca syndrome, unspecified: Secondary | ICD-10-CM

## 2017-11-20 DIAGNOSIS — R5383 Other fatigue: Secondary | ICD-10-CM

## 2017-11-20 DIAGNOSIS — M359 Systemic involvement of connective tissue, unspecified: Secondary | ICD-10-CM

## 2017-11-20 MED ORDER — TRIAMCINOLONE ACETONIDE 40 MG/ML IJ SUSP
40.0000 mg | INTRAMUSCULAR | Status: AC | PRN
  Administered 2017-11-20: 40 mg via INTRA_ARTICULAR

## 2017-11-20 MED ORDER — LIDOCAINE HCL 1 % IJ SOLN
1.5000 mL | INTRAMUSCULAR | Status: AC | PRN
  Administered 2017-11-20: 1.5 mL

## 2017-11-20 NOTE — Patient Instructions (Addendum)
Hip Exercises Ask your health care provider which exercises are safe for you. Do exercises exactly as told by your health care provider and adjust them as directed. It is normal to feel mild stretching, pulling, tightness, or discomfort as you do these exercises, but you should stop right away if you feel sudden pain or your pain gets worse.Do not begin these exercises until told by your health care provider. STRETCHING AND RANGE OF MOTION EXERCISES These exercises warm up your muscles and joints and improve the movement and flexibility of your hip. These exercises also help to relieve pain, numbness, and tingling. Exercise A: Hamstrings, Supine  1. Lie on your back. 2. Loop a belt or towel over the ball of your left / rightfoot. The ball of your foot is on the walking surface, right under your toes. 3. Straighten your left / rightknee and slowly pull on the belt to raise your leg. ? Do not let your left / right knee bend while you do this. ? Keep your other leg flat on the floor. ? Raise the left / right leg until you feel a gentle stretch behind your left / right knee or thigh. 4. Hold this position for __________ seconds. 5. Slowly return your leg to the starting position. Repeat __________ times. Complete this stretch __________ times a day. Exercise B: Hip Rotators  1. Lie on your back on a firm surface. 2. Hold your left / right knee with your left / right hand. Hold your ankle with your other hand. 3. Gently pull your left / right knee and rotate your lower leg toward your other shoulder. ? Pull until you feel a stretch in your buttocks. ? Keep your hips and shoulders firmly planted while you do this stretch. 4. Hold this position for __________ seconds. Repeat __________ times. Complete this stretch __________ times a day. Exercise C: V-Sit (Hamstrings and Adductors)  1. Sit on the floor with your legs extended in a large "V" shape. Keep your knees straight during this  exercise. 2. Start with your head and chest upright, then bend at your waist to reach for your left foot (position A). You should feel a stretch in your right inner thigh. 3. Hold this position for __________ seconds. Then slowly return to the upright position. 4. Bend at your waist to reach forward (position B). You should feel a stretch behind both of your thighs and knees. 5. Hold this position for __________ seconds. Then slowly return to the upright position. 6. Bend at your waist to reach for your right foot (position C). You should feel a stretch in your left inner thigh. 7. Hold this position for __________ seconds. Then slowly return to the upright position. Repeat __________ times. Complete this stretch __________ times a day. Exercise D: Lunge (Hip Flexors)  1. Place your left / right knee on the floor and bend your other knee so that is directly over your ankle. You should be half-kneeling. 2. Keep good posture with your head over your shoulders. 3. Tighten your buttocks to point your tailbone downward. This helps your back to keep from arching too much. 4. You should feel a gentle stretch in the front of your left / right thigh and hip. If you do not feel any resistance, slightly slide your other foot forward and then slowly lunge forward so your knee once again lines up over your ankle. 5. Make sure your tailbone continues to point downward. 6. Hold this position for __________ seconds. Repeat   __________ times. Complete this stretch __________ times a day. STRENGTHENING EXERCISES These exercises build strength and endurance in your hip. Endurance is the ability to use your muscles for a long time, even after they get tired. Exercise E: Bridge (Hip Extensors)  1. Lie on your back on a firm surface with your knees bent and your feet flat on the floor. 2. Tighten your buttocks muscles and lift your bottom off the floor until the trunk of your body is level with your thighs. ? Do not  arch your back. ? You should feel the muscles working in your buttocks and the back of your thighs. If you do not feel these muscles, slide your feet 1-2 inches (2.5-5 cm) farther away from your buttocks. 3. Hold this position for __________ seconds. 4. Slowly lower your hips to the starting position. 5. Let your muscles relax completely between repetitions. 6. If this exercise is too easy, try doing it with your arms crossed over your chest. Repeat __________ times. Complete this exercise __________ times a day. Exercise F: Straight Leg Raises - Hip Abductors  1. Lie on your side with your left / right leg in the top position. Lie so your head, shoulder, knee, and hip line up with each other. You may bend your bottom knee to help you balance. 2. Roll your hips slightly forward, so your hips are stacked directly over each other and your left / right knee is facing forward. 3. Leading with your heel, lift your top leg 4-6 inches (10-15 cm). You should feel the muscles in your outer hip lifting. ? Do not let your foot drift forward. ? Do not let your knee roll toward the ceiling. 4. Hold this position for __________ seconds. 5. Slowly return to the starting position. 6. Let your muscles relax completely between repetitions. Repeat __________ times. Complete this exercise __________ times a day. Exercise G: Straight Leg Raises - Hip Adductors  1. Lie on your side with your left / right leg in the bottom position. Lie so your head, shoulder, knee, and hip line up. You may place your upper foot in front to help you balance. 2. Roll your hips slightly forward, so your hips are stacked directly over each other and your left / right knee is facing forward. 3. Tense the muscles in your inner thigh and lift your bottom leg 4-6 inches (10-15 cm). 4. Hold this position for __________ seconds. 5. Slowly return to the starting position. 6. Let your muscles relax completely between repetitions. Repeat  __________ times. Complete this exercise __________ times a day. Exercise H: Straight Leg Raises - Quadriceps  1. Lie on your back with your left / right leg extended and your other knee bent. 2. Tense the muscles in the front of your left / right thigh. When you do this, you should see your kneecap slide up or see increased dimpling just above your knee. 3. Tighten these muscles even more and raise your leg 4-6 inches (10-15 cm) off the floor. 4. Hold this position for __________ seconds. 5. Keep these muscles tense as you lower your leg. 6. Relax the muscles slowly and completely between repetitions. Repeat __________ times. Complete this exercise __________ times a day. Exercise I: Hip Abductors, Standing 1. Tie one end of a rubber exercise band or tubing to a secure surface, such as a table or pole. 2. Loop the other end of the band or tubing around your left / right ankle. 3. Keeping your ankle with   the band or tubing directly opposite of the secured end, step away until there is tension in the tubing or band. Hold onto a chair as needed for balance. 4. Lift your left / right leg out to your side. While you do this: ? Keep your back upright. ? Keep your shoulders over your hips. ? Keep your toes pointing forward. ? Make sure to use your hip muscles to lift your leg. Do not "throw" your leg or tip your body to lift your leg. 5. Hold this position for __________ seconds. 6. Slowly return to the starting position. Repeat __________ times. Complete this exercise __________ times a day. Exercise J: Squats (Quadriceps) 1. Stand in a door frame so your feet and knees are in line with the frame. You may place your hands on the frame for balance. 2. Slowly bend your knees and lower your hips like you are going to sit in a chair. ? Keep your lower legs in a straight-up-and-down position. ? Do not let your hips go lower than your knees. ? Do not bend your knees lower than told by your health  care provider. ? If your hip pain increases, do not bend as low. 3. Hold this position for ___________ seconds. 4. Slowly push with your legs to return to standing. Do not use your hands to pull yourself to standing. Repeat __________ times. Complete this exercise __________ times a day. This information is not intended to replace advice given to you by your health care provider. Make sure you discuss any questions you have with your health care provider. Document Released: 11/30/2005 Document Revised: 08/06/2016 Document Reviewed: 11/07/2015 Elsevier Interactive Patient Education  2018 [place] [facility] Ask your health care provider which exercises are safe for you. Do exercises exactly as told by your health care provider and adjust them as directed. It is normal to feel mild stretching, pulling, tightness, or discomfort as you do these exercises, but you should stop right away if you feel sudden pain or your pain gets worse.Do not begin these exercises until told by your health care provider. Stretching and range of motion exercises These exercises warm up your muscles and joints and improve the movement and flexibility of your hip and pelvis. Exercise A: Quadriceps, prone  1. Lie on your abdomen on a firm surface, such as a bed or padded floor. 2. Bend your left / right knee and hold your ankle. If you cannot reach your ankle or pant leg, loop a belt around your foot and grab the belt instead. 3. Gently pull your heel toward your buttocks. Your knee should not slide out to the side. You should feel a stretch in the front of your thigh and knee. 4. Hold this position for __________ seconds. Repeat __________ times. Complete this stretch __________ times a day. Exercise B: Iliotibial band  1. Lie on your side with your left / right leg in the top position. 2. Bend both of your knees and grab your left / right ankle. Stretch out your bottom arm to help you  balance. 3. Slowly bring your top knee back so your thigh goes behind your trunk. 4. Slowly lower your top leg toward the floor until you feel a gentle stretch on the outside of your left / right hip and thigh. If you do not feel a stretch and your knee will not fall farther, place the heel of your other foot on top of your knee and pull your knee  down toward the floor with your foot. 5. Hold this position for __________ seconds. Repeat __________ times. Complete this stretch __________ times a day. Strengthening exercises These exercises build strength and endurance in your hip and pelvis. Endurance is the ability to use your muscles for a long time, even after they get tired. Exercise C: Straight leg raises ( hip abductors) 8. Lie on your side with your left / right leg in the top position. Lie so your head, shoulder, knee, and hip line up. You may bend your bottom knee to help you balance. 9. Roll your hips slightly forward so your hips are stacked directly over each other and your left / right knee is facing forward. 10. Tense the muscles in your outer thigh and lift your top leg 4-6 inches (10-15 cm). 11. Hold this position for __________ seconds. 12. Slowly return to the starting position. Let your muscles relax completely before doing another repetition. Repeat __________ times. Complete this exercise __________ times a day. Exercise D: Straight leg raises ( hip extensors) 7. Lie on your abdomen on your bed or a firm surface. You can put a pillow under your hips if that is more comfortable. 8. Bend your left / right knee so your foot is straight up in the air. 9. Squeeze your buttock muscles and lift your left / right thigh off the bed. Do not let your back arch. 10. Tense this muscle as hard as you can without increasing any knee pain. 11. Hold this position for __________ seconds. 12. Slowly lower your leg to the starting position and allow it to relax completely. Repeat __________  times. Complete this exercise __________ times a day. Exercise E: Hip hike 1. Stand sideways on a bottom step. Stand on your left / right leg with your other foot unsupported next to the step. You can hold onto the railing or wall if needed for balance. 2. Keep your knees straight and your torso square. Then, lift your left / right hip up toward the ceiling. 3. Slowly let your left / right hip lower toward the floor, past the starting position. Your foot should get closer to the floor. Do not lean or bend your knees. Repeat __________ times. Complete this exercise __________ times a day. This information is not intended to replace advice given to you by your health care provider. Make sure you discuss any questions you have with your health care provider. Document Released: 11/12/2005 Document Revised: 07/17/2016 Document Reviewed: 10/14/2015 Elsevier Interactive Patient Education  [employer].

## 2017-11-20 NOTE — Progress Notes (Signed)
Subjective:  Right hip pain    History of Present Illness: Sherry Jacobs is a 51 y.o. female with history of autoimmune disease and fibromyalgia.  Patient reports right hip pain that started on November 12, 2017.  She denies any injury or pain this severe in the past.  She states the pain has been worsening.  She reports the pain radiates down to her knee and sometimes her foot laterally.  She states the pain has caused her to limp.  She states the pain is worse in the evening and at night.  She states the pain has been waking her up at night and it is difficult to find a good sleeping position.  She reports pain with range of motion.  She has tried icing her hip twice daily and taking Ibuprofen.  She started becoming nauseated with the use of ibuprofen so she has not taken any today.  She denies any pain or swelling in other joints.  She states her Raynaud's is under control, no color change or ulcerations.  She continues to take PLQ 200 mg twice daily M-Fand vitamin D 5,000 twice weekly.  She denies sicca symptoms at this time and reports she only has dry eyes with contact use but has been wearing her glasses daily.    Activities of Daily Living:  Patient reports morning stiffness for 10 minutes.   Patient Reports nocturnal pain.  Difficulty dressing/grooming: Denies Difficulty climbing stairs: Reports Difficulty getting out of chair: Reports Difficulty using hands for taps, buttons, cutlery, and/or writing: Denies   Review of Systems  Constitutional: Negative for fatigue and weakness.  HENT: Negative for mouth sores, mouth dryness and nose dryness.   Eyes: Negative for redness and dryness.  Respiratory: Negative for cough, hemoptysis, shortness of breath and difficulty breathing.   Cardiovascular: Negative for chest pain, palpitations, hypertension and swelling in legs/feet.  Gastrointestinal: Negative for blood in stool, constipation and diarrhea.  Endocrine: Negative for increased  urination.  Genitourinary: Negative for painful urination.  Musculoskeletal: Positive for arthralgias and joint pain. Negative for joint swelling, myalgias, muscle weakness, morning stiffness, muscle tenderness and myalgias.  Skin: Negative for color change, pallor, rash, hair loss, nodules/bumps, redness, skin tightness, ulcers and sensitivity to sunlight.  Neurological: Negative for dizziness, numbness and headaches.  Hematological: Negative for swollen glands.  Psychiatric/Behavioral: Positive for sleep disturbance. Negative for depressed mood. The patient is not nervous/anxious.     PMFS History:  Patient Active Problem List   Diagnosis Date Noted  . URI (upper respiratory infection) 02/19/2017  . Neck strain 02/19/2017  . High risk medication use 01/02/2017  . Right ankle pain 08/05/2013  . Cellulitis, toe 03/21/2012  . UNSPECIFIED VITAMIN D DEFICIENCY 02/02/2009  . 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 LEG PAIN, LEFT 10/28/2008  . ANXIETY 06/23/2008  . UNSPECIFIED OSTEOPOROSIS 12/29/2007  . HYPERLIPIDEMIA 10/02/2007  . INJURY, 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 09/09/2007  . Palpitations 09/09/2007  . ANA POSITIVE, HX OF 09/09/2007  . IRRITABLE BOWEL SYNDROME 04/11/2007  . FIBROMYALGIA 04/11/2007    Past Medical History:  Diagnosis Date  . ANA positive   . Arthralgia   . Autoimmune disease ([place])    ANA positive  . Endometrial polyp   . IBS (irritable bowel syndrome)   . Iron deficiency anemia   . Lactose intolerance in adult   . PONV (postoperative nausea and vomiting)   . Social anxiety disorder   . Wears contact lenses     Family History  Problem Relation Age  of Onset  . Breast cancer Mother   . Colon polyps Father   . Diabetes Unknown   . Hypertension Unknown   . Alcohol abuse Unknown   . Hyperlipidemia Unknown   . Arthritis Unknown   . Stroke Unknown   . Thyroid disease Unknown   . Uterine cancer Unknown   . Breast cancer Paternal Grandmother   . Melanoma Maternal  Grandfather    Past Surgical History:  Procedure Laterality Date  . APPENDECTOMY  1981  . DILATATION & CURETTAGE/HYSTEROSCOPY WITH MYOSURE N/A 10/28/2014   Procedure: DILATATION & CURETTAGE/HYSTEROSCOPY WITH MYOSURE;  Surgeon: Marylynn Pearson, MD;  Location: [facility];  Service: Gynecology;  Laterality: N/A;  . 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 She appears well-developed and well-nourished.  HENT:  Head: Normocephalic and atraumatic.  Eyes: Conjunctivae and EOM are normal.  Neck: Normal range of motion.  Cardiovascular: Normal rate, regular rhythm, normal heart sounds and intact distal pulses.  Pulmonary/Chest: Effort normal and breath sounds normal.  Abdominal: Soft. Bowel sounds are normal.  Lymphadenopathy:    She has no cervical adenopathy.  Neurological: She is alert and oriented to person, place, and time.  Skin: Skin is warm and dry. Capillary refill takes less than 2 seconds.  Psychiatric: She has a normal mood and affect. Her behavior is normal.  Nursing note and vitals reviewed.    Musculoskeletal Exam: C-spine, thoracic, and lumbar spine good ROM.  Shoulder joints, elbow joints, wrist joints, MCPs, PIPs, and DIPs good ROM with no synovitis.  Knee joints and ankle joints good ROM with no synovitis.  No knee crepitus, warmth, or effusion.  MTPs, PIPs, and DIPs good ROM with no synovitis.  No midline spinal  tenderness.  No SI joint tenderness.  Left joint full ROM with no discomfort.  Mild tenderness of left trochanteric bursa.  Right hip pain with limited ROM.  Tenderness of right trochanteric bursa and IT band.    CDAI Exam: No CDAI exam completed.    Investigation: No additional findings.PLQ eye exam: 03/2016 CBC Latest Ref Rng & Units 06/03/2017 01/04/2017 04/15/2015  WBC 4.0 - 10.5 K/uL 4.3 4.4 4.8  Hemoglobin 12.0 - 15.0 g/dL 13.5 13.1 12.2  Hematocrit 36.0 - 46.0 % 40.6 39.0 36.7  Platelets 150.0 - 400.0 K/uL 229.0 244 260.0   CMP Latest Ref Rng & Units 06/03/2017 01/04/2017 04/15/2015  Glucose 70 - 99 mg/dL 149(H) 81 95  BUN 6 - 23 mg/dL 16 18 10   Creatinine 0.40 - 1.20 mg/dL 0.94 0.78 0.97  Sodium 135 - 145 mEq/L 137 137 139  Potassium 3.5 - 5.1 mEq/L 4.4 4.6 4.2  Chloride 96 - 112 mEq/L 100 102 106  CO2 19 - 32 mEq/L 30 27 28  Calcium 8.4 - 10.5 mg/dL 10.3 9.3 9.2  Total Protein 6.0 - 8.3 g/dL 7.5 7.0 6.9  Total Bilirubin 0.2 - 1.2 mg/dL 0.4 0.4 0.3  Alkaline Phos 39 - 117 U/L 50 49 43  AST 0 - 37 U/L 16 17 15   ALT 0 - 35 U/L 13 12 13     Imaging: Xr Hip Unilat W Or W/o Pelvis 2-3 Views Right  Result Date: 11/20/2017 Bilateral hip joint x-rays reveal no joint space narrowing.  calcification was noted over the right greater trochanter. Impression: Normal x-ray of the hip joints.   Speciality Comments: No specialty comments available.    Procedures:  Large Joint Inj: bilateral greater trochanter on 11/20/2017 10:51 AM Indications: pain and diagnostic evaluation Details: 27 G 1.5 in needle, lateral approach  Arthrogram: No  Medications (Right): 1.5 mL lidocaine 1 %; 40 mg triamcinolone acetonide 40 MG/ML Aspirate (Right): 0 mL Medications (Left): 1.5 mL lidocaine 1 %; 40 mg triamcinolone acetonide 40 MG/ML Aspirate (Left): 0 mL Outcome: tolerated well, no immediate complications Procedure, treatment alternatives, risks and benefits explained, specific risks discussed.  Consent was given by the patient. Immediately prior to procedure a time out was called to verify the correct patient, procedure, equipment, support staff and site/side marked as required. Patient was prepped and draped in the usual sterile fashion.     Allergies: Sulfa antibiotics     Assessment / Plan:     Visit Diagnoses: Autoimmune disease ([place]) - +ANA, low C3, arthralgia, fatigue, Sicca symptoms, and Raynaud's  -Patient is clinically doing well.  She denies sicca symptoms and Raynaud's symptoms.  She has right trochanteric bursitis but denies any other joint pain or swelling at this time. Routine autoimmune labs were drawn today.  Plan: Urinalysis, Routine w reflex microscopic, VITAMIN D 25 Hydroxy (Vit-D Deficiency, Fractures), Anti-DNA antibody, double-stranded, Sedimentation rate, ANA, C3 and C4  High risk medication use - PLQ eye exam: performed in September 2018 per patient.  She called her ophthalmologist while in the office today to have them fax the results to [place]. CBC, CMP, and UA were drawn today.  These labs will be repeated every 5 months. - Plan: Urinalysis, Routine w reflex microscopic  Raynaud's disease without gangrene: No signs or symptoms of Raynaud's at this time.   Sicca syndrome, unspecified ([place]): No sicca symptoms at this time.  She wears her glasses daily to avoid dry eye symptoms.    Trochanteric bursitis, right hip - She has tenderness of her right trochanteric bursa and IT band.  A x-ray of her right hip was performed today.  A handout for IT Band exercises and hip exercises were given to the patient.  A referral for physical therapy with Ileana Roup was also made.  She had bilateral trochanteric bursitis cortisone injections performed today as well.  She tolerated the procedure well.   Plan: Ambulatory referral to Physical Therapy  Pain in right hip - Pain with limited range of motion.  She has radiation of pain in her right groin and down the lateral aspect of her  right thigh. XR was performed today. Plan: XR HIP UNILAT W OR W/O PELVIS 2-3 VIEWS RIGHT  Trochanteric bursitis, left hip - mild tenderness of left trochanteric bursa.  Patient will be evaluated at physical therapy for bilateral trochanteric bursitis.  A cortisone injection was performed today. Plan: Ambulatory referral to Physical Therapy  Fibromyalgia: Overall doing well.    Other fatigue: Doing well.  Other medical conditions are listed as  follows:   History of IBS  History of anxiety  History of hyperlipidemia    Orders: Orders Placed This Encounter  Procedures  . Large Joint Inj: bilateral greater trochanter  . XR HIP UNILAT W OR W/O PELVIS 2-3 VIEWS RIGHT  . Urinalysis, Routine w reflex microscopic  . VITAMIN D 25 Hydroxy (Vit-D Deficiency, Fractures)  . Anti-DNA antibody, double-stranded  . Sedimentation rate  . ANA  . C3 and C4  . Ambulatory referral to Physical Therapy   No orders of the defined types were placed in this encounter.   Face-to-face time spent with patient was 30 minutes. Greater than 50% of time was spent in counseling and coordination of care.  Follow-Up Instructions: Return in about 5 months (around 04/20/2018) for Autoimmune Disease, Fibromyalgia.  Bo Merino, MD Note - This record has been created using Editor, commissioning.  Chart creation errors have been sought, but may not always  have been located. Such creation errors do not reflect on  the standard of medical care.

## 2017-11-21 DIAGNOSIS — Z1231 Encounter for screening mammogram for malignant neoplasm of breast: Secondary | ICD-10-CM | POA: Diagnosis not present

## 2017-11-21 DIAGNOSIS — Z803 Family history of malignant neoplasm of breast: Secondary | ICD-10-CM | POA: Diagnosis not present

## 2017-11-21 LAB — CBC WITH DIFFERENTIAL/PLATELET
BASOS ABS: 31 {cells}/uL (ref 0–200)
Basophils Relative: 0.5 %
EOS ABS: 79 {cells}/uL (ref 15–500)
Eosinophils Relative: 1.3 %
HCT: 39.3 % (ref 35.0–45.0)
Hemoglobin: 13.4 g/dL (ref 11.7–15.5)
Lymphs Abs: 1312 cells/uL (ref 850–3900)
MCH: 27.8 pg (ref 27.0–33.0)
MCHC: 34.1 g/dL (ref 32.0–36.0)
MCV: 81.5 fL (ref 80.0–100.0)
MONOS PCT: 8.9 %
MPV: 11.2 fL (ref 7.5–12.5)
Neutro Abs: 4136 cells/uL (ref 1500–7800)
Neutrophils Relative %: 67.8 %
PLATELETS: 276 10*3/uL (ref 140–400)
RBC: 4.82 10*6/uL (ref 3.80–5.10)
RDW: 13.3 % (ref 11.0–15.0)
TOTAL LYMPHOCYTE: 21.5 %
WBC mixed population: 543 cells/uL (ref 200–950)
WBC: 6.1 10*3/uL (ref 3.8–10.8)

## 2017-11-21 LAB — C3 AND C4
C3 Complement: 134 mg/dL (ref 83–193)
C4 Complement: 36 mg/dL (ref 15–57)

## 2017-11-21 LAB — COMPLETE METABOLIC PANEL WITH GFR
AG RATIO: 1.8 (calc) (ref 1.0–2.5)
ALT: 12 U/L (ref 6–29)
AST: 15 U/L (ref 10–35)
Albumin: 4.5 g/dL (ref 3.6–5.1)
Alkaline phosphatase (APISO): 65 U/L (ref 33–130)
BUN: 17 mg/dL (ref 7–25)
CALCIUM: 9.9 mg/dL (ref 8.6–10.4)
CO2: 32 mmol/L (ref 20–32)
Chloride: 102 mmol/L (ref 98–110)
Creat: 0.8 mg/dL (ref 0.50–1.05)
GFR, EST AFRICAN AMERICAN: 98 mL/min/{1.73_m2} (ref >=60)
GFR, EST NON AFRICAN AMERICAN: 85 mL/min/{1.73_m2} (ref >=60)
GLOBULIN: 2.5 g/dL (ref 1.9–3.7)
Glucose, Bld: 95 mg/dL (ref 65–99)
POTASSIUM: 4.3 mmol/L (ref 3.5–5.3)
SODIUM: 140 mmol/L (ref 135–146)
Total Bilirubin: 0.3 mg/dL (ref 0.2–1.2)
Total Protein: 7 g/dL (ref 6.1–8.1)

## 2017-11-21 LAB — URINALYSIS, ROUTINE W REFLEX MICROSCOPIC
Bilirubin Urine: NEGATIVE
GLUCOSE, UA: NEGATIVE
HGB URINE DIPSTICK: NEGATIVE
Ketones, ur: NEGATIVE
LEUKOCYTES UA: NEGATIVE
Nitrite: NEGATIVE
PH: 7.5 (ref 5.0–8.0)
Protein, ur: NEGATIVE
Specific Gravity, Urine: 1.01 (ref 1.001–1.03)

## 2017-11-21 LAB — ANTI-NUCLEAR AB-TITER (ANA TITER)

## 2017-11-21 LAB — SEDIMENTATION RATE: Sed Rate: 14 mm/h (ref 0–30)

## 2017-11-21 LAB — ANTI-DNA ANTIBODY, DOUBLE-STRANDED: ds DNA Ab: 1 IU/mL

## 2017-11-21 LAB — ANA: Anti Nuclear Antibody(ANA): POSITIVE — AB

## 2017-11-21 LAB — VITAMIN D 25 HYDROXY (VIT D DEFICIENCY, FRACTURES): VIT D 25 HYDROXY: 32 ng/mL (ref 30–100)

## 2017-11-21 NOTE — Progress Notes (Signed)
Labs are stable.

## 2017-12-03 ENCOUNTER — Telehealth: Payer: Self-pay | Admitting: Rheumatology

## 2017-12-03 NOTE — Telephone Encounter (Signed)
Patient called to request a PT referral to Ileana Roup at [facility].     She has scheduled an appointment for 12/16/17 and needs referral before the initial evaluation.  CB# [phone]   Fax # [phone]

## 2017-12-05 NOTE — Telephone Encounter (Signed)
Re-faxed referral.

## 2017-12-09 ENCOUNTER — Ambulatory Visit: Payer: 59 | Admitting: Rheumatology

## 2017-12-16 DIAGNOSIS — M6281 Muscle weakness (generalized): Secondary | ICD-10-CM | POA: Diagnosis not present

## 2017-12-16 DIAGNOSIS — M791 Myalgia, unspecified site: Secondary | ICD-10-CM | POA: Diagnosis not present

## 2017-12-16 DIAGNOSIS — M7062 Trochanteric bursitis, left hip: Secondary | ICD-10-CM | POA: Diagnosis not present

## 2017-12-16 DIAGNOSIS — M6289 Other specified disorders of muscle: Secondary | ICD-10-CM | POA: Diagnosis not present

## 2017-12-16 DIAGNOSIS — M7061 Trochanteric bursitis, right hip: Secondary | ICD-10-CM | POA: Diagnosis not present

## 2017-12-23 DIAGNOSIS — M6281 Muscle weakness (generalized): Secondary | ICD-10-CM | POA: Diagnosis not present

## 2017-12-23 DIAGNOSIS — M6289 Other specified disorders of muscle: Secondary | ICD-10-CM | POA: Diagnosis not present

## 2017-12-23 DIAGNOSIS — M7061 Trochanteric bursitis, right hip: Secondary | ICD-10-CM | POA: Diagnosis not present

## 2017-12-23 DIAGNOSIS — M7062 Trochanteric bursitis, left hip: Secondary | ICD-10-CM | POA: Diagnosis not present

## 2017-12-23 DIAGNOSIS — M791 Myalgia, unspecified site: Secondary | ICD-10-CM | POA: Diagnosis not present

## 2017-12-31 DIAGNOSIS — R102 Pelvic and perineal pain: Secondary | ICD-10-CM | POA: Diagnosis not present

## 2017-12-31 DIAGNOSIS — M62838 Other muscle spasm: Secondary | ICD-10-CM | POA: Diagnosis not present

## 2017-12-31 DIAGNOSIS — M791 Myalgia, unspecified site: Secondary | ICD-10-CM | POA: Diagnosis not present

## 2017-12-31 DIAGNOSIS — M7061 Trochanteric bursitis, right hip: Secondary | ICD-10-CM | POA: Diagnosis not present

## 2017-12-31 DIAGNOSIS — M6289 Other specified disorders of muscle: Secondary | ICD-10-CM | POA: Diagnosis not present

## 2017-12-31 DIAGNOSIS — M6281 Muscle weakness (generalized): Secondary | ICD-10-CM | POA: Diagnosis not present

## 2018-01-02 ENCOUNTER — Ambulatory Visit (INDEPENDENT_AMBULATORY_CARE_PROVIDER_SITE_OTHER): Payer: 59 | Admitting: Family Medicine

## 2018-01-02 ENCOUNTER — Encounter: Payer: Self-pay | Admitting: Family Medicine

## 2018-01-02 VITALS — BP 122/74 | HR 92 | Temp 98.7°F | Resp 16 | Ht 67.0 in | Wt 171.8 lb

## 2018-01-02 DIAGNOSIS — Z0000 Encounter for general adult medical examination without abnormal findings: Secondary | ICD-10-CM | POA: Diagnosis not present

## 2018-01-02 NOTE — Assessment & Plan Note (Signed)
ghm utd Check labs Other labs done by rheum---labs reviewed rto 1 year

## 2018-01-02 NOTE — Patient Instructions (Signed)
Preventive Care 40-64 Years, Female Preventive care refers to lifestyle choices and visits with your health care provider that can promote health and wellness. What does preventive care include?  A yearly physical exam. This is also called an annual well check.  Dental exams once or twice a year.  Routine eye exams. Ask your health care provider how often you should have your eyes checked.  Personal lifestyle choices, including: ? Daily care of your teeth and gums. ? Regular physical activity. ? Eating a healthy diet. ? Avoiding tobacco and drug use. ? Limiting alcohol use. ? Practicing safe sex. ? Taking low-dose aspirin daily starting at age 58. ? Taking vitamin and mineral supplements as recommended by your health care provider. What happens during an annual well check? The services and screenings done by your health care provider during your annual well check will depend on your age, overall health, lifestyle risk factors, and family history of disease. Counseling Your health care provider may ask you questions about your:  Alcohol use.  Tobacco use.  Drug use.  Emotional well-being.  Home and relationship well-being.  Sexual activity.  Eating habits.  Work and work Statistician.  Method of birth control.  Menstrual cycle.  Pregnancy history.  Screening You may have the following tests or measurements:  Height, weight, and BMI.  Blood pressure.  Lipid and cholesterol levels. These may be checked every 5 years, or more frequently if you are over 81 years old.  Skin check.  Lung cancer screening. You may have this screening every year starting at age 78 if you have a 30-pack-year history of smoking and currently smoke or have quit within the past 15 years.  Fecal occult blood test (FOBT) of the stool. You may have this test every year starting at age 65.  Flexible sigmoidoscopy or colonoscopy. You may have a sigmoidoscopy every 5 years or a colonoscopy  every 10 years starting at age 30.  Hepatitis C blood test.  Hepatitis B blood test.  Sexually transmitted disease (STD) testing.  Diabetes screening. This is done by checking your blood sugar (glucose) after you have not eaten for a while (fasting). You may have this done every 1-3 years.  Mammogram. This may be done every 1-2 years. Talk to your health care provider about when you should start having regular mammograms. This may depend on whether you have a family history of breast cancer.  BRCA-related cancer screening. This may be done if you have a family history of breast, ovarian, tubal, or peritoneal cancers.  Pelvic exam and Pap test. This may be done every 3 years starting at age 80. Starting at age 36, this may be done every 5 years if you have a Pap test in combination with an HPV test.  Bone density scan. This is done to screen for osteoporosis. You may have this scan if you are at high risk for osteoporosis.  Discuss your test results, treatment options, and if necessary, the need for more tests with your health care provider. Vaccines Your health care provider may recommend certain vaccines, such as:  Influenza vaccine. This is recommended every year.  Tetanus, diphtheria, and acellular pertussis (Tdap, Td) vaccine. You may need a Td booster every 10 years.  Varicella vaccine. You may need this if you have not been vaccinated.  Zoster vaccine. You may need this after age 5.  Measles, mumps, and rubella (MMR) vaccine. You may need at least one dose of MMR if you were born in  1957 or later. You may also need a second dose.  Pneumococcal 13-valent conjugate (PCV13) vaccine. You may need this if you have certain conditions and were not previously vaccinated.  Pneumococcal polysaccharide (PPSV23) vaccine. You may need one or two doses if you smoke cigarettes or if you have certain conditions.  Meningococcal vaccine. You may need this if you have certain  conditions.  Hepatitis A vaccine. You may need this if you have certain conditions or if you travel or work in places where you may be exposed to hepatitis A.  Hepatitis B vaccine. You may need this if you have certain conditions or if you travel or work in places where you may be exposed to hepatitis B.  Haemophilus influenzae type b (Hib) vaccine. You may need this if you have certain conditions.  Talk to your health care provider about which screenings and vaccines you need and how often you need them. This information is not intended to replace advice given to you by your health care provider. Make sure you discuss any questions you have with your health care provider. Document Released: 12/09/2015 Document Revised: 08/01/2016 Document Reviewed: 09/13/2015 Elsevier Interactive Patient Education  [employer].

## 2018-01-02 NOTE — Progress Notes (Deleted)
Subjective:     Sherry Jacobs is a 52 y.o. female and is here for a comprehensive physical exam. The patient reports no problems.  Social History   Socioeconomic History  . Marital status: Married    Spouse name: Not on file  . Number of children: 2  . Years of education: Not on file  . Highest education level: Not on file  Social Needs  . Financial resource strain: Not on file  . Food insecurity - worry: Not on file  . Food insecurity - inability: Not on file  . Transportation needs - medical: Not on file  . Transportation needs - non-medical: Not on file  Occupational History  . Occupation: Optician, dispensing: [employer]  Tobacco Use  . Smoking status: Never Smoker  . Smokeless tobacco: Never Used  Substance and Sexual Activity  . Alcohol use: Yes    Comment: social   . Drug use: No  . Sexual activity: Yes    Partners: Male  Other Topics Concern  . 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 COLONOSCOPY  02/04/2015  . PAP SMEAR  02/06/2018  . MAMMOGRAM  11/14/2018  . TETANUS/TDAP  12/15/2018  . INFLUENZA VACCINE  Completed  . HIV Screening  Completed    The following portions of the patient's history were reviewed and updated as appropriate:  She  has a past medical history of ANA positive, Arthralgia, Autoimmune disease ([place]), Endometrial polyp, IBS (irritable bowel syndrome), Iron deficiency anemia, Lactose intolerance in adult, PONV (postoperative nausea and vomiting), Social anxiety disorder, and Wears contact lenses. She does not have any pertinent problems on file. She  has a past surgical history that includes Appendectomy (1981); RIGHT MODIFIED NECK DISSECTION W/ REMOVAL BENIGN RIGHT PARAPHARYNGEAL SPACE MASS AND LYMPH NODES (08-02-2004); and Dilatation & curettage/hysteroscopy with myosure (N/A, 10/28/2014). Her  family history includes Alcohol abuse in her unknown relative; Arthritis in her unknown relative; Breast cancer in her mother and paternal grandmother; Colon polyps in her father; Diabetes in her unknown relative; Hyperlipidemia in her unknown relative; Hypertension in her unknown relative; Melanoma in her maternal grandfather; Stroke in her unknown relative; Thyroid disease in her unknown relative; Uterine cancer in her unknown relative. She  reports that  has never smoked. she has never used smokeless tobacco. She reports that she drinks alcohol. She reports that she does not use drugs. She has a current medication list which includes the following prescription(s): alprazolam, vitamin d3, hydroxychloroquine, multivitamin, propranolol, and tretinoin. Current Outpatient Medications on File Prior to Visit  Medication Sig Dispense Refill  . ALPRAZolam (XANAX) 0.25 MG tablet Take 0.25 mg by mouth at bedtime as needed for anxiety.    . Cholecalciferol (VITAMIN D3) 5000 units CAPS Take by mouth 2 (two) times a week.    . hydroxychloroquine (PLAQUENIL) 200 MG tablet Take 200 mg by mouth daily.    . Multiple Vitamin (MULTIVITAMIN) tablet Take 1 tablet by mouth daily.      . propranolol (INDERAL) 20 MG tablet Take 20 mg by mouth as needed.     . tretinoin (RETIN-A) 0.025 % cream   3   No current facility-administered medications on file prior to visit.  She is allergic to sulfa antibiotics..  Review of Systems Review of Systems  Constitutional: Negative for activity change, appetite change and fatigue.  HENT: Negative for hearing loss, congestion, tinnitus and ear discharge.  dentist q60m Eyes: Negative for visual disturbance (see optho q1y -- vision corrected to 20/20 with glasses).  Respiratory: Negative for cough, chest tightness and shortness of breath.   Cardiovascular: Negative for chest pain, palpitations and leg swelling.  Gastrointestinal: Negative for abdominal pain, diarrhea, constipation  and abdominal distention.  Genitourinary: Negative for urgency, frequency, decreased urine volume and difficulty urinating.  Musculoskeletal: Negative for back pain, arthralgias and gait problem.  Skin: Negative for color change, pallor and rash.  Neurological: Negative for dizziness, light-headedness, numbness and headaches.  Hematological: Negative for adenopathy. Does not bruise/bleed easily.  Psychiatric/Behavioral: Negative for suicidal ideas, confusion, sleep disturbance, self-injury, dysphoric mood, decreased concentration and agitation.       Objective:    BP 122/74 (BP Location: Left Arm, Patient Position: Sitting, Cuff Size: Normal) Comment (BP Location): l  Pulse 92   Temp 98.7 F (37.1 C) (Oral)   Resp 16   Ht 5\' 7"  (1.702 m)   Wt 171 lb 12.8 oz (77.9 kg)   SpO2 98%   BMI 26.91 kg/m  General appearance: alert, cooperative, appears stated age and no distress Head: Normocephalic, without obvious abnormality, atraumatic Eyes: conjunctivae/corneas clear. PERRL, EOM's intact. Fundi benign. Ears: normal TM's and external ear canals both ears Nose: Nares normal. Septum midline. Mucosa normal. No drainage or sinus tenderness. Throat: lips, mucosa, and tongue normal; teeth and gums normal Neck: no adenopathy, no carotid bruit, no JVD, supple, symmetrical, trachea midline and thyroid not enlarged, symmetric, no tenderness/mass/nodules Back: symmetric, no curvature. ROM normal. No CVA tenderness. Lungs: clear to auscultation bilaterally Breasts: gyn Heart: regular rate and rhythm, S1, S2 normal, no murmur, click, rub or gallop Abdomen: soft, non-tender; bowel sounds normal; no masses,  no organomegaly Pelvic: deferred--gyn Extremities: extremities normal, atraumatic, no cyanosis or edema Pulses: 2+ and symmetric Skin: Skin color, texture, turgor normal. No rashes or lesions Lymph nodes: Cervical, supraclavicular, and axillary nodes normal. Neurologic: Alert and oriented X 3,  normal strength and tone. Normal symmetric reflexes. Normal coordination and gait    Assessment:    Healthy female exam.     Plan:    ghm utd Check labs See After Visit Summary for Counseling Recommendations    1. Preventative health care Other labs done by rheum Labs reviewed - Lipid panel; Future - TSH; Future See above

## 2018-01-02 NOTE — Progress Notes (Signed)
Subjective:  I acted as a Education administrator for Dr. Tristan Schroeder, RMA   Patient ID: Sherry Jacobs, female    DOB: 28-Jun-1966, 52 y.o.   MRN: [id number]  Chief Complaint  Patient presents with  . Annual Exam    HPI  Patient is in today for annual exam. No complaints  Patient Care Team: Carollee Herter, Alferd Apa, DO as PCP - General   Past Medical History:  Diagnosis Date  . ANA positive   . Arthralgia   . 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 Endometrial polyp   . IBS (irritable bowel syndrome)   . Iron deficiency anemia   . Lactose intolerance in adult   . PONV (postoperative nausea and vomiting)   . Social anxiety disorder   . Wears contact lenses     Past Surgical History:  Procedure Laterality Date  . APPENDECTOMY  1981  . DILATATION & CURETTAGE/HYSTEROSCOPY WITH MYOSURE N/A 10/28/2014   Procedure: DILATATION & CURETTAGE/HYSTEROSCOPY WITH MYOSURE;  Surgeon: Marylynn Pearson, MD;  Location: [facility];  Service: Gynecology;  Laterality: N/A;  . RIGHT MODIFIED NECK DISSECTION W/ REMOVAL BENIGN RIGHT PARAPHARYNGEAL SPACE MASS AND LYMPH NODES  08-02-2004   benign bronchogenic cysts / negative lymph nodes    Family History  Problem Relation Age of Onset  . Breast cancer Mother   . Colon polyps Father   . Diabetes Unknown   . Hypertension Unknown   . Alcohol abuse Unknown   . Hyperlipidemia Unknown   . Arthritis Unknown   . Stroke Unknown   . Thyroid disease Unknown   . Uterine cancer Unknown   . Breast cancer Paternal Grandmother   . Melanoma Maternal Grandfather     Social History   Socioeconomic History  . Marital status: Married    Spouse name: Not on file  . Number of children: 2  . Years of education: Not on file  . Highest education level: Not on file  Social Needs  . Financial resource strain: Not on file  . Food insecurity - worry: Not on file  . Food insecurity - inability: Not on file  . Transportation needs - medical: Not on  file  . Transportation needs - non-medical: Not on file  Occupational History  . Occupation: Optician, dispensing: [employer]  Tobacco Use  . Smoking status: Never Smoker  . Smokeless tobacco: Never Used  Substance and Sexual Activity  . Alcohol use: Yes    Comment: social   . Drug use: No  . Sexual activity: Yes    Partners: Male  Other Topics Concern  . 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 ALPRAZolam (XANAX) 0.25 MG tablet Take 0.25 mg by mouth at bedtime as needed for anxiety.    . Cholecalciferol (VITAMIN D3) 5000 units CAPS Take by mouth 2 (two) times a week.    . hydroxychloroquine (PLAQUENIL) 200 MG tablet Take 200 mg by mouth daily.    . Multiple Vitamin (MULTIVITAMIN) tablet Take 1 tablet by mouth daily.      . propranolol (INDERAL) 20 MG tablet Take 20 mg by mouth as needed.     Marland Kitchen  tretinoin (RETIN-A) 0.025 % cream   3  . hydroxychloroquine (PLAQUENIL) 200 MG tablet 200 mg twice a day Monday through Friday; none on Saturday or Sunday. (Patient taking differently: 200 mg daily. 200 mg once a day.) 120 tablet 1  . omeprazole (PRILOSEC) 20 MG capsule Take 1 capsule (20 mg total) by mouth daily. 30 capsule 3   No facility-administered medications prior to visit.     Allergies  Allergen Reactions  . Sulfa Antibiotics Nausea And Vomiting    Review of Systems  Constitutional: Negative for chills, fever and malaise/fatigue.  HENT: Negative for congestion and hearing loss.   Eyes: Negative for discharge.  Respiratory: Negative for cough, sputum production and shortness of breath.   Cardiovascular: Negative for chest pain, palpitations and leg swelling.  Gastrointestinal: Negative for abdominal pain, blood in stool, constipation, diarrhea, heartburn, nausea and vomiting.  Genitourinary:  Negative for dysuria, frequency, hematuria and urgency.  Musculoskeletal: Negative for back pain, falls and myalgias.  Skin: Negative for rash.  Neurological: Negative for dizziness, sensory change, loss of consciousness, weakness and headaches.  Endo/Heme/Allergies: Negative for environmental allergies. Does not bruise/bleed easily.  Psychiatric/Behavioral: Negative for depression and suicidal ideas. The patient is not nervous/anxious and does not have insomnia.        Objective:    Physical Exam  Constitutional: She is oriented to person, place, and time. She appears well-developed and well-nourished. No distress.  HENT:  Right Ear: External ear normal.  Left Ear: External ear normal.  Nose: Nose normal.  Mouth/Throat: Oropharynx is clear and moist.  Eyes: EOM are normal. Pupils are equal, round, and reactive to light.  Neck: Normal range of motion. Neck supple.  Cardiovascular: Normal rate, regular rhythm and normal heart sounds.  No murmur heard. Pulmonary/Chest: Effort normal and breath sounds normal. No respiratory distress. She has no wheezes. She has no rales. She exhibits no tenderness.  Musculoskeletal: Normal range of motion. She exhibits deformity. She exhibits no edema.  Neurological: She is alert and oriented to person, place, and time.  Skin: Skin is warm and dry. No rash noted.  Psychiatric: She has a normal mood and affect. Her behavior is normal. Judgment and thought content normal.  Nursing note and vitals reviewed. breast/ pelvic-- per gyn  BP 122/74 (BP Location: Left Arm, Patient Position: Sitting, Cuff Size: Normal) Comment (BP Location): l  Pulse 92   Temp 98.7 F (37.1 C) (Oral)   Resp 16   Ht 5\' 7"  (1.702 m)   Wt 171 lb 12.8 oz (77.9 kg)   SpO2 98%   BMI 26.91 kg/m  Wt Readings from Last 3 Encounters:  01/02/18 171 lb 12.8 oz (77.9 kg)  11/20/17 178 lb (80.7 kg)  07/19/17 167 lb 8 oz (76 kg)   BP Readings from Last 3 Encounters:  01/02/18 122/74   11/20/17 111/76  07/19/17 114/82     Immunization History  Administered Date(s) Administered  . H1N1 10/13/2008  . Influenza Whole 11/26/2004, 09/15/2008, 08/30/2010  . Influenza-Unspecified 08/27/2011, 08/26/2016  . Pneumococcal Polysaccharide-23 05/11/2011  . Td 12/15/2008    Health Maintenance  Topic Date Due  . COLONOSCOPY  02/04/2015  . PAP SMEAR  02/06/2018  . MAMMOGRAM  11/14/2018  . TETANUS/TDAP  12/15/2018  . INFLUENZA VACCINE  Completed  . HIV Screening  Completed    Lab Results  Component Value Date   WBC 6.1 11/20/2017   HGB 13.4 11/20/2017   HCT 39.3 11/20/2017   PLT 276 11/20/2017  GLUCOSE 95 11/20/2017   CHOL 190 04/15/2015   TRIG 75.0 04/15/2015   HDL 57.50 04/15/2015   LDLDIRECT 137.7 07/31/2013   LDLCALC 118 (H) 04/15/2015   ALT 12 11/20/2017   AST 15 11/20/2017   NA 140 11/20/2017   K 4.3 11/20/2017   CL 102 11/20/2017   CREATININE 0.80 11/20/2017   BUN 17 11/20/2017   CO2 32 11/20/2017   TSH 1.56 01/04/2017    Lab Results  Component Value Date   TSH 1.56 01/04/2017   Lab Results  Component Value Date   WBC 6.1 11/20/2017   HGB 13.4 11/20/2017   HCT 39.3 11/20/2017   MCV 81.5 11/20/2017   PLT 276 11/20/2017   Lab Results  Component Value Date   NA 140 11/20/2017   K 4.3 11/20/2017   CO2 32 11/20/2017   GLUCOSE 95 11/20/2017   BUN 17 11/20/2017   CREATININE 0.80 11/20/2017   BILITOT 0.3 11/20/2017   ALKPHOS 50 06/03/2017   AST 15 11/20/2017   ALT 12 11/20/2017   PROT 7.0 11/20/2017   ALBUMIN 4.6 06/03/2017   CALCIUM 9.9 11/20/2017   GFR 66.38 06/03/2017   Lab Results  Component Value Date   CHOL 190 04/15/2015   Lab Results  Component Value Date   HDL 57.50 04/15/2015   Lab Results  Component Value Date   LDLCALC 118 (H) 04/15/2015   Lab Results  Component Value Date   TRIG 75.0 04/15/2015   Lab Results  Component Value Date   CHOLHDL 3 04/15/2015   No results found for: HGBA1C       Assessment &  Plan:   Problem List Items Addressed This Visit    None    Visit Diagnoses    Preventative health care    -  Primary   Relevant Orders   Lipid panel   TSH      I have discontinued Claiborne Billings A. Worland's omeprazole. I am also having her maintain her multivitamin, propranolol, ALPRAZolam, tretinoin, Vitamin D3, and hydroxychloroquine.  No orders of the defined types were placed in this encounter. 1. Preventative health care See above Other labs done by rheum----- labs reviewed ghm utd Check labs  - Lipid panel; Future - TSH; Future   CMA served as scribe during this visit. History, Physical and Plan performed by medical provider. Documentation and orders reviewed and attested to.  Ann Held, DO

## 2018-01-06 ENCOUNTER — Other Ambulatory Visit: Payer: 59

## 2018-01-08 DIAGNOSIS — R102 Pelvic and perineal pain: Secondary | ICD-10-CM | POA: Diagnosis not present

## 2018-01-08 DIAGNOSIS — M62838 Other muscle spasm: Secondary | ICD-10-CM | POA: Diagnosis not present

## 2018-01-08 DIAGNOSIS — M7062 Trochanteric bursitis, left hip: Secondary | ICD-10-CM | POA: Diagnosis not present

## 2018-01-08 DIAGNOSIS — M6281 Muscle weakness (generalized): Secondary | ICD-10-CM | POA: Diagnosis not present

## 2018-01-08 DIAGNOSIS — M791 Myalgia, unspecified site: Secondary | ICD-10-CM | POA: Diagnosis not present

## 2018-01-08 DIAGNOSIS — M6289 Other specified disorders of muscle: Secondary | ICD-10-CM | POA: Diagnosis not present

## 2018-01-16 DIAGNOSIS — M7062 Trochanteric bursitis, left hip: Secondary | ICD-10-CM | POA: Diagnosis not present

## 2018-01-16 DIAGNOSIS — M6289 Other specified disorders of muscle: Secondary | ICD-10-CM | POA: Diagnosis not present

## 2018-01-16 DIAGNOSIS — M7061 Trochanteric bursitis, right hip: Secondary | ICD-10-CM | POA: Diagnosis not present

## 2018-01-16 DIAGNOSIS — M6281 Muscle weakness (generalized): Secondary | ICD-10-CM | POA: Diagnosis not present

## 2018-01-16 DIAGNOSIS — M62838 Other muscle spasm: Secondary | ICD-10-CM | POA: Diagnosis not present

## 2018-01-30 DIAGNOSIS — M7061 Trochanteric bursitis, right hip: Secondary | ICD-10-CM | POA: Diagnosis not present

## 2018-01-30 DIAGNOSIS — M62838 Other muscle spasm: Secondary | ICD-10-CM | POA: Diagnosis not present

## 2018-01-30 DIAGNOSIS — R102 Pelvic and perineal pain: Secondary | ICD-10-CM | POA: Diagnosis not present

## 2018-01-30 DIAGNOSIS — M6289 Other specified disorders of muscle: Secondary | ICD-10-CM | POA: Diagnosis not present

## 2018-01-30 DIAGNOSIS — M6281 Muscle weakness (generalized): Secondary | ICD-10-CM | POA: Diagnosis not present

## 2018-02-04 DIAGNOSIS — M62838 Other muscle spasm: Secondary | ICD-10-CM | POA: Diagnosis not present

## 2018-02-04 DIAGNOSIS — M7061 Trochanteric bursitis, right hip: Secondary | ICD-10-CM | POA: Diagnosis not present

## 2018-02-04 DIAGNOSIS — R102 Pelvic and perineal pain: Secondary | ICD-10-CM | POA: Diagnosis not present

## 2018-02-04 DIAGNOSIS — M6289 Other specified disorders of muscle: Secondary | ICD-10-CM | POA: Diagnosis not present

## 2018-02-04 DIAGNOSIS — M6281 Muscle weakness (generalized): Secondary | ICD-10-CM | POA: Diagnosis not present

## 2018-02-04 DIAGNOSIS — M7062 Trochanteric bursitis, left hip: Secondary | ICD-10-CM | POA: Diagnosis not present

## 2018-02-06 DIAGNOSIS — H524 Presbyopia: Secondary | ICD-10-CM | POA: Diagnosis not present

## 2018-02-06 DIAGNOSIS — H5203 Hypermetropia, bilateral: Secondary | ICD-10-CM | POA: Diagnosis not present

## 2018-02-25 MED FILL — PROPRANOLOL 20 MG TABLET: 20 | 30 days supply | Qty: 90 | Fill #0

## 2018-02-25 MED FILL — ALPRAZolam 0.25 MG TABS: 0.25 | 30 days supply | Qty: 120 | Fill #0

## 2018-03-03 DIAGNOSIS — M6281 Muscle weakness (generalized): Secondary | ICD-10-CM | POA: Diagnosis not present

## 2018-03-03 DIAGNOSIS — M62838 Other muscle spasm: Secondary | ICD-10-CM | POA: Diagnosis not present

## 2018-03-03 DIAGNOSIS — R102 Pelvic and perineal pain: Secondary | ICD-10-CM | POA: Diagnosis not present

## 2018-03-03 DIAGNOSIS — M6289 Other specified disorders of muscle: Secondary | ICD-10-CM | POA: Diagnosis not present

## 2018-03-24 ENCOUNTER — Other Ambulatory Visit (INDEPENDENT_AMBULATORY_CARE_PROVIDER_SITE_OTHER): Payer: 59

## 2018-03-24 DIAGNOSIS — Z0000 Encounter for general adult medical examination without abnormal findings: Secondary | ICD-10-CM

## 2018-03-24 LAB — LIPID PANEL
CHOLESTEROL: 247 mg/dL — AB (ref 0–200)
HDL: 70 mg/dL (ref >39.00)
LDL CALC: 161 mg/dL — AB (ref 0–99)
NonHDL: 177.17
TRIGLYCERIDES: 79 mg/dL (ref 0.0–149.0)
Total CHOL/HDL Ratio: 4
VLDL: 15.8 mg/dL (ref 0.0–40.0)

## 2018-03-24 LAB — TSH: TSH: 3.21 u[IU]/mL (ref 0.35–4.50)

## 2018-03-25 ENCOUNTER — Other Ambulatory Visit: Payer: Self-pay | Admitting: Obstetrics and Gynecology

## 2018-03-25 DIAGNOSIS — Z803 Family history of malignant neoplasm of breast: Secondary | ICD-10-CM

## 2018-04-17 NOTE — Progress Notes (Deleted)
Office Visit Note  Patient: Sherry Jacobs             Date of Birth: October 10, 1966           MRN: [id number]             PCP: Ann Held, DO Referring: Ann Held, * Visit Date: 04/29/2018 Occupation: @GUAROCC @    Subjective:  No chief complaint on file.   History of Present Illness: Sherry Jacobs is a 52 y.o. female ***   Activities of Daily Living:  Patient reports morning stiffness for *** {minute/hour:19697}.   Patient {ACTIONS;DENIES/REPORTS:21021675::"Denies"} nocturnal pain.  Difficulty dressing/grooming: {ACTIONS;DENIES/REPORTS:21021675::"Denies"} Difficulty climbing stairs: {ACTIONS;DENIES/REPORTS:21021675::"Denies"} Difficulty getting out of chair: {ACTIONS;DENIES/REPORTS:21021675::"Denies"} Difficulty using hands for taps, buttons, cutlery, and/or writing: {ACTIONS;DENIES/REPORTS:21021675::"Denies"}   No Rheumatology ROS completed.   PMFS History:  Patient Active Problem List   Diagnosis Date Noted  . Preventative health care 01/02/2018  . URI (upper respiratory infection) 02/19/2017  . Neck strain 02/19/2017  . High risk medication use 01/02/2017  . Right ankle pain 08/05/2013  . Cellulitis, toe 03/21/2012  . UNSPECIFIED VITAMIN D DEFICIENCY 02/02/2009  . 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 LEG PAIN, LEFT 10/28/2008  . ANXIETY 06/23/2008  . UNSPECIFIED OSTEOPOROSIS 12/29/2007  . HYPERLIPIDEMIA 10/02/2007  . INJURY, 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 09/09/2007  . Palpitations 09/09/2007  . ANA POSITIVE, HX OF 09/09/2007  . IRRITABLE BOWEL SYNDROME 04/11/2007  . FIBROMYALGIA 04/11/2007    Past Medical History:  Diagnosis Date  . ANA positive   . Arthralgia   . 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 Endometrial polyp   . IBS (irritable bowel syndrome)   . Iron deficiency anemia   . Lactose intolerance in adult   . PONV (postoperative nausea and vomiting)   . Social anxiety disorder   . Wears contact lenses     Family History    Problem Relation Age of Onset  . Breast cancer Mother   . Colon polyps Father   . Diabetes Unknown   . Hypertension Unknown   . Alcohol abuse Unknown   . Hyperlipidemia Unknown   . Arthritis Unknown   . Stroke Unknown   . Thyroid disease Unknown   . Uterine cancer Unknown   . Breast cancer Paternal Grandmother   . Melanoma Maternal Grandfather    Past Surgical History:  Procedure Laterality Date  . APPENDECTOMY  1981  . DILATATION & CURETTAGE/HYSTEROSCOPY WITH MYOSURE N/A 10/28/2014   Procedure: DILATATION & CURETTAGE/HYSTEROSCOPY WITH MYOSURE;  Surgeon: Marylynn Pearson, MD;  Location: [facility];  Service: Gynecology;  Laterality: N/A;  . 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   Physical Exam   Musculoskeletal Exam: ***  CDAI Exam: No CDAI exam completed.    Investigation: No additional findings. PLQ eye exam: 08/14/2017 CBC Latest Ref Rng & Units 11/20/2017 06/03/2017 01/04/2017  WBC 3.8 - 10.8 Thousand/uL 6.1 4.3 4.4  Hemoglobin 11.7 - 15.5 g/dL 13.4 13.5 13.1  Hematocrit 35.0 -  45.0 % 39.3 40.6 39.0  Platelets 140 - 400 Thousand/uL 276 229.0 244   CMP Latest Ref Rng & Units 11/20/2017 06/03/2017 01/04/2017  Glucose 65 - 99 mg/dL 95 149(H) 81  BUN 7 - 25 mg/dL 17 16 18   Creatinine 0.50 - 1.05 mg/dL 0.80 0.94 0.78  Sodium 135 - 146 mmol/L 140 137 137  Potassium 3.5 - 5.3 mmol/L 4.3 4.4 4.6  Chloride 98 - 110 mmol/L 102 100 102  CO2 20 - 32 mmol/L 32 30 27  Calcium 8.6 - 10.4 mg/dL 9.9 10.3 9.3  Total Protein 6.1 - 8.1 g/dL 7.0 7.5 7.0  Total Bilirubin 0.2 - 1.2 mg/dL 0.3 0.4 0.4  Alkaline Phos 39 - 117 U/L -  50 49  AST 10 - 35 U/L 15 16 17   ALT 6 - 29 U/L 12 13 12     Imaging: No results found.  Speciality Comments: PLQ EYe Exam: 08/14/17 WNL @ [facility].    Procedures:  No procedures performed Allergies: Sulfa antibiotics   Assessment / Plan:     Visit Diagnoses: No diagnosis found.    Orders: No orders of the defined types were placed in this encounter.  No orders of the defined types were placed in this encounter.   Face-to-face time spent with patient was *** minutes. 50% of time was spent in counseling and coordination of care.  Follow-Up Instructions: No follow-ups on file.   Earnestine Mealing, CMA  Note - This record has been created using Editor, commissioning.  Chart creation errors have been sought, but may not always  have been located. Such creation errors do not reflect on  the standard of medical care.

## 2018-04-29 ENCOUNTER — Ambulatory Visit: Payer: 59 | Admitting: Rheumatology

## 2018-05-05 ENCOUNTER — Encounter (HOSPITAL_BASED_OUTPATIENT_CLINIC_OR_DEPARTMENT_OTHER): Payer: Self-pay

## 2018-05-05 ENCOUNTER — Emergency Department (HOSPITAL_BASED_OUTPATIENT_CLINIC_OR_DEPARTMENT_OTHER)
Admission: EM | Admit: 2018-05-05 | Discharge: 2018-05-05 | Disposition: A | Discharge status: Home / Self Care | Payer: 59 | Attending: Emergency Medicine | Admitting: Emergency Medicine

## 2018-05-05 ENCOUNTER — Other Ambulatory Visit: Payer: Self-pay

## 2018-05-05 DIAGNOSIS — Z77098 Contact with and (suspected) exposure to other hazardous, chiefly nonmedicinal, chemicals: Secondary | ICD-10-CM | POA: Insufficient documentation

## 2018-05-05 DIAGNOSIS — Z79899 Other long term (current) drug therapy: Secondary | ICD-10-CM | POA: Insufficient documentation

## 2018-05-05 NOTE — Discharge Instructions (Signed)
Your exam was reassuring.  See attached handout.  If you develop worsening or new concerning symptoms you can return to the emergency department for re-evaluation.

## 2018-05-05 NOTE — ED Notes (Signed)
Pt verbalizes understanding of d/c instructions and denies any further needs at this time. 
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
## 2018-05-27 NOTE — Progress Notes (Signed)
Office Visit Note  Patient: Sherry Jacobs             Date of Birth: 07-Dec-1965           MRN: [id number]             PCP: Ann Held, DO Referring: Ann Held, * Visit Date: 06/10/2018 Occupation: @GUAROCC @    Subjective:  Medication monitoring   History of Present Illness: Sherry Jacobs is a 52 y.o. female with history of autoimmune disease, fibromyalgia, and Raynaud's disease.  She takes Plaquenil 200 mg 1 tablet by mouth daily.  She occasionally misses doses but tries to take 1 tablet every morning.  She denies any recent signs of a flare. She denies any fatigue or low-grade fevers.  She denies any mouth or nose sores.  She denies any swollen lymph nodes.  She denies any rashes or photosensitivity.  She has not experienced any shortness of breath or palpitations.  She has occasional eye dryness related to contact use.  She denies any mouth dryness.  She denies any joint pain or joint swelling.  She denies any joint stiffness.  She denies any symptoms of Raynauds.  She denies any digital ulcerations.  She denies any recent fibromyalgia flares.  She denies any muscle aches muscle tenderness.  She states she has had some increased insomnia which she relates to being perimenopausal.  She denies any fatigue at this time.  She reports that bilateral her bursitis has improved significantly.  She is in physical therapy which helped her pain significantly.  She performs exercises at home that has been preventing recurrences.    Activities of Daily Living:  Patient reports morning stiffness for 0 minutes.   Patient Denies nocturnal pain.  Difficulty dressing/grooming: Denies Difficulty climbing stairs: Denies Difficulty getting out of chair: Denies Difficulty using hands for taps, buttons, cutlery, and/or writing: Denies   Review of Systems  Constitutional: Negative for fatigue.  HENT: Negative for mouth sores, trouble swallowing, trouble swallowing and mouth  dryness.   Eyes: Positive for dryness.       Due to contact use.   Respiratory: Negative for shortness of breath and difficulty breathing.   Cardiovascular: Negative for chest pain and swelling in legs/feet.  Gastrointestinal: Negative for abdominal pain, constipation and diarrhea.  Endocrine: Negative for increased urination.  Genitourinary: Negative for pelvic pain.  Musculoskeletal: Negative for arthralgias, joint pain, joint swelling and morning stiffness.  Skin: Negative for rash and hair loss.  Allergic/Immunologic: Negative for susceptible to infections.  Neurological: Negative for dizziness, light-headedness, headaches, memory loss and weakness.  Hematological: Negative for bruising/bleeding tendency.  Psychiatric/Behavioral: Negative for confusion.    PMFS History:  Patient Active Problem List   Diagnosis Date Noted  . Preventative health care 01/02/2018  . URI (upper respiratory infection) 02/19/2017  . Neck strain 02/19/2017  . High risk medication use 01/02/2017  . Right ankle pain 08/05/2013  . Cellulitis, toe 03/21/2012  . UNSPECIFIED VITAMIN D DEFICIENCY 02/02/2009  . 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 LEG PAIN, LEFT 10/28/2008  . ANXIETY 06/23/2008  . UNSPECIFIED OSTEOPOROSIS 12/29/2007  . HYPERLIPIDEMIA 10/02/2007  . INJURY, 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 09/09/2007  . Palpitations 09/09/2007  . ANA POSITIVE, HX OF 09/09/2007  . IRRITABLE BOWEL SYNDROME 04/11/2007  . FIBROMYALGIA 04/11/2007    Past Medical History:  Diagnosis Date  . ANA positive   . Arthralgia   . Autoimmune disease ([place])    ANA positive  .  Endometrial polyp   . IBS (irritable bowel syndrome)   . Iron deficiency anemia   . Lactose intolerance in adult   . PONV (postoperative nausea and vomiting)   . Social anxiety disorder   . Wears contact lenses     Family History  Problem Relation Age of Onset  . Breast cancer Mother   . Colon polyps Father   . Diabetes Unknown   . Hypertension Unknown    . Alcohol abuse Unknown   . Hyperlipidemia Unknown   . Arthritis Unknown   . Stroke Unknown   . Thyroid disease Unknown   . Uterine cancer Unknown   . Breast cancer Paternal Grandmother   . Melanoma Maternal Grandfather   . Asthma Daughter        exercise induced   . Asthma Son        exercise induced    Past Surgical History:  Procedure Laterality Date  . APPENDECTOMY  1981  . DILATATION & CURETTAGE/HYSTEROSCOPY WITH MYOSURE N/A 10/28/2014   Procedure: DILATATION & CURETTAGE/HYSTEROSCOPY WITH MYOSURE;  Surgeon: Marylynn Pearson, MD;  Location: [facility];  Service: Gynecology;  Laterality: N/A;  . 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 She appears well-developed and well-nourished.  HENT:  Head: Normocephalic and atraumatic.  No oral or nasal ulcerations.  No parotid swelling.  Eyes: Conjunctivae and EOM are normal.  Neck: Normal range of motion.  Cardiovascular: Normal rate, regular rhythm, normal heart sounds and intact distal pulses.  Pulmonary/Chest: Effort normal and breath sounds normal.  Abdominal: Soft. Bowel sounds are normal.  Lymphadenopathy:    She has no cervical adenopathy.  Neurological: She is alert and oriented to person, place, and time.  Skin: Skin is warm and dry. Capillary refill takes less than 2 seconds.  No digital ulcerations or signs of gangrene noted on fingertips.  No  malar rash noted.  Psychiatric: She has a normal mood and affect. Her behavior is normal.  Nursing note and vitals reviewed.    Musculoskeletal Exam: C-spine, thoracic spine, lumbar spine good range of motion.  No midline spinal tenderness.  No SI joint tenderness.  Shoulder joints, elbow joints, wrist joints, MCPs, PIPs, DIPs good range of motion with no synovitis.  Hip joints, knee joints, ankle joints, MTPs, PIPs, DIPs good range of motion no synovitis.  No warmth or effusion of bilateral knee joints.  She has bilateral knee crepitus.  No tenderness of trochanteric bursa bilaterally.  CDAI Exam: No CDAI exam completed.    Investigation: No additional findings.PLQ eye exam: 08/14/2017  CBC Latest Ref Rng & Units 11/20/2017 06/03/2017 01/04/2017  WBC 3.8 - 10.8 Thousand/uL 6.1 4.3 4.4  Hemoglobin 11.7 - 15.5 g/dL 13.4 13.5 13.1  Hematocrit 35.0 - 45.0 % 39.3 40.6 39.0  Platelets 140 - 400 Thousand/uL 276 229.0 244   CMP Latest Ref Rng & Units 11/20/2017 06/03/2017 01/04/2017  Glucose 65 - 99 mg/dL 95 149(H) 81  BUN 7 - 25 mg/dL 17 16 18   Creatinine 0.50 - 1.05 mg/dL 0.80 0.94 0.78  Sodium 135 - 146 mmol/L 140 137 137  Potassium 3.5 - 5.3 mmol/L 4.3 4.4 4.6  Chloride 98 - 110 mmol/L 102 100 102  CO2 20 - 32 mmol/L 32 30 27  Calcium 8.6 - 10.4 mg/dL 9.9 10.3 9.3  Total Protein 6.1 - 8.1 g/dL 7.0 7.5 7.0  Total Bilirubin 0.2 - 1.2 mg/dL 0.3 0.4 0.4  Alkaline Phos 39 - 117 U/L - 50 49  AST 10 - 35 U/L 15 16 17   ALT 6 - 29 U/L 12 13 12     Imaging: No results found.  Speciality Comments: PLQ EYe Exam: 08/14/17 WNL @ [facility].    Procedures:  No procedures performed Allergies: Sulfa antibiotics   Assessment / Plan:     Visit Diagnoses: Autoimmune disease ([place]) -  +ANA, low C3, arthralgia, fatigue, Sicca symptoms, and Raynaud's: She is clinically doing well on Plaquenil 200 mg 1 tablet by mouth daily.  She has no signs or symptoms of autoimmune disease at this time.  She has  not had any signs of a flare.  She has no synovitis on exam.  She has no joint pain, joint swelling, or joint stiffness at this time.  She has no oral or nasal ulcerations on exam.  No cervical lymphadenopathy was palpated.  No malar rash or photosensitivity.  She denies any symptoms of Raynaud's and no signs of digital ulcerations or gangrene were noted.  She has intermittent eye dryness but attributes it to contact use.  She has no mouth dryness or parotid swelling.  She has not had any shortness of breath or palpations.  No fatigue or low-grade fevers.  Since she is clinically been doing well we will start spacing out her Plaquenil dosing to 1 tablet every other day.  A prescription for Plaquenil sent to the pharmacy today.  CBC and CMP were drawn today to monitor for drug toxicity.  She will return in 3 months for autoimmune labs and to recheck CBC and CMP.  She was advised to notify us if she develops any new or worsening symptoms. - Plan: CBC with Differential/Platelet, COMPLETE METABOLIC PANEL WITH GFR, ANA, C3 and C4, VITAMIN D 25 Hydroxy (Vit-D Deficiency, Fractures), Sedimentation rate, Urinalysis, Routine w reflex microscopic, Anti-DNA antibody, double-stranded  High risk medication use - PLQ.eye exam: 08/14/2017.  She has a Plaquenil eye exam scheduled for 08/15/2018.  She was given a Plaquenil eye exam form today in the office.  CBC and CMP were drawn today to monitor for drug toxicity.- Plan: CBC with Differential/Platelet, COMPLETE METABOLIC PANEL WITH GFR  Raynaud's disease without gangrene: She has no symptoms of Raynaud's.  No digital ulcerations or signs of gangrene were noted.   Sicca syndrome, unspecified ([place]): She is intermittent eye dryness which she attributes to wearing contacts.  She has no mouth dryness.  She has no parotid swelling on exam.  Trochanteric bursitis, right hip: Resolved.  She went to physical therapy and has been performing home exercises on her own.  She has no  tenderness on exam today.  Trochanteric bursitis, left hip: Left.  She has no tenderness on exam today.  Fibromyalgia: She has not had  any recent fibromyalgia flares.  She has no muscle aches or muscle tenderness at this time.  She reports no fatigue.  She has been having some difficulty sleeping at night which she attributes to menopause.  Other fatigue: She has no fatigue at this time.  Other medical conditions are listed as follows:  History of hyperlipidemia  History of IBS  History of anxiety    Orders: Orders Placed This Encounter  Procedures  . CBC with Differential/Platelet  . COMPLETE METABOLIC PANEL WITH GFR  . ANA  . C3 and C4  . VITAMIN D 25 Hydroxy (Vit-D Deficiency, Fractures)  . Sedimentation rate  . Urinalysis, Routine w reflex microscopic  . Anti-DNA antibody, double-stranded   Meds ordered this encounter  Medications  . hydroxychloroquine (PLAQUENIL) 200 MG tablet    Sig: Take 1 tablet (200 mg) by mouth every other day.    Dispense:  45 tablet    Refill:  0      Follow-Up Instructions: Return in about 6 months (around 12/11/2018) for Autoimmune Disease, Fibromyalgia.   Ofilia Neas, PA-C   I examined and evaluated the patient with [name] PA.  Patient had no synovitis on my examination.  We will try to decrease her Plaquenil to every other day 3 months.  The plan of care was discussed as noted above.  Bo Merino, MD  Note - This record has been created using Editor, commissioning.  Chart creation errors have been sought, but may not always  have been located. Such creation errors do not reflect on  the standard of medical care.

## 2018-06-10 ENCOUNTER — Ambulatory Visit (INDEPENDENT_AMBULATORY_CARE_PROVIDER_SITE_OTHER): Payer: 59 | Admitting: Rheumatology

## 2018-06-10 ENCOUNTER — Encounter: Payer: Self-pay | Admitting: Rheumatology

## 2018-06-10 VITALS — BP 93/71 | HR 77 | Resp 13 | Ht 67.0 in | Wt 185.0 lb

## 2018-06-10 DIAGNOSIS — M797 Fibromyalgia: Secondary | ICD-10-CM

## 2018-06-10 DIAGNOSIS — M7062 Trochanteric bursitis, left hip: Secondary | ICD-10-CM | POA: Diagnosis not present

## 2018-06-10 DIAGNOSIS — M3500 Sicca syndrome, unspecified: Secondary | ICD-10-CM

## 2018-06-10 DIAGNOSIS — M359 Systemic involvement of connective tissue, unspecified: Secondary | ICD-10-CM

## 2018-06-10 DIAGNOSIS — Z8719 Personal history of other diseases of the digestive system: Secondary | ICD-10-CM

## 2018-06-10 DIAGNOSIS — M7061 Trochanteric bursitis, right hip: Secondary | ICD-10-CM | POA: Diagnosis not present

## 2018-06-10 DIAGNOSIS — R5383 Other fatigue: Secondary | ICD-10-CM

## 2018-06-10 DIAGNOSIS — I7300 Raynaud's syndrome without gangrene: Secondary | ICD-10-CM | POA: Diagnosis not present

## 2018-06-10 DIAGNOSIS — Z79899 Other long term (current) drug therapy: Secondary | ICD-10-CM | POA: Diagnosis not present

## 2018-06-10 DIAGNOSIS — Z8659 Personal history of other mental and behavioral disorders: Secondary | ICD-10-CM | POA: Diagnosis not present

## 2018-06-10 DIAGNOSIS — D8989 Other specified disorders involving the immune mechanism, not elsewhere classified: Secondary | ICD-10-CM

## 2018-06-10 DIAGNOSIS — Z8639 Personal history of other endocrine, nutritional and metabolic disease: Secondary | ICD-10-CM | POA: Diagnosis not present

## 2018-06-10 LAB — COMPLETE METABOLIC PANEL WITH GFR
AG Ratio: 1.8 (calc) (ref 1.0–2.5)
ALBUMIN MSPROF: 4.4 g/dL (ref 3.6–5.1)
ALKALINE PHOSPHATASE (APISO): 59 U/L (ref 33–130)
ALT: 23 U/L (ref 6–29)
AST: 21 U/L (ref 10–35)
BILIRUBIN TOTAL: 0.4 mg/dL (ref 0.2–1.2)
BUN / CREAT RATIO: 14 (calc) (ref 6–22)
BUN: 15 mg/dL (ref 7–25)
CO2: 29 mmol/L (ref 20–32)
Calcium: 9.7 mg/dL (ref 8.6–10.4)
Chloride: 103 mmol/L (ref 98–110)
Creat: 1.09 mg/dL — ABNORMAL HIGH (ref 0.50–1.05)
GFR, Est African American: 67 mL/min/{1.73_m2} (ref >=60)
GFR, Est Non African American: 58 mL/min/{1.73_m2} — ABNORMAL LOW (ref >=60)
GLOBULIN: 2.5 g/dL (ref 1.9–3.7)
GLUCOSE: 91 mg/dL (ref 65–99)
POTASSIUM: 4.4 mmol/L (ref 3.5–5.3)
SODIUM: 139 mmol/L (ref 135–146)
Total Protein: 6.9 g/dL (ref 6.1–8.1)

## 2018-06-10 LAB — CBC WITH DIFFERENTIAL/PLATELET
BASOS ABS: 32 {cells}/uL (ref 0–200)
Basophils Relative: 0.6 %
EOS ABS: 38 {cells}/uL (ref 15–500)
Eosinophils Relative: 0.7 %
HCT: 40.8 % (ref 35.0–45.0)
HEMOGLOBIN: 13.4 g/dL (ref 11.7–15.5)
Lymphs Abs: 1377 cells/uL (ref 850–3900)
MCH: 27.6 pg (ref 27.0–33.0)
MCHC: 32.8 g/dL (ref 32.0–36.0)
MCV: 84 fL (ref 80.0–100.0)
MONOS PCT: 8.6 %
MPV: 11.1 fL (ref 7.5–12.5)
NEUTROS ABS: 3488 {cells}/uL (ref 1500–7800)
Neutrophils Relative %: 64.6 %
Platelets: 246 10*3/uL (ref 140–400)
RBC: 4.86 10*6/uL (ref 3.80–5.10)
RDW: 13.1 % (ref 11.0–15.0)
Total Lymphocyte: 25.5 %
WBC mixed population: 464 cells/uL (ref 200–950)
WBC: 5.4 10*3/uL (ref 3.8–10.8)

## 2018-06-10 MED ORDER — HYDROXYCHLOROQUINE SULFATE 200 MG PO TABS: ORAL_TABLET | ORAL | 0 refills | Status: DC

## 2018-06-10 MED FILL — HYDROXYCHLOROQUINE SULFATE: 200 | 90 days supply | Qty: 45 | Fill #0

## 2018-06-10 NOTE — Patient Instructions (Signed)
Standing Labs We placed an order today for your standing lab work.    Please come back and get your standing labs in October and every 3 months   Autoimmune labs and CBC/CMP  We have open lab Monday through Friday from 8:30-11:30 AM and 1:30-4:00 PM  at the office of Dr. Bo Merino.   You may experience shorter wait times on Monday and Friday afternoons. The office is located at [address] [place], [place], [place] [address] No appointment is necessary.   Labs are drawn by [facility].  You may receive a bill from [facility] for your lab work. If you have any questions regarding directions or hours of operation,  please call [phone].

## 2018-06-18 DIAGNOSIS — Z86018 Personal history of other benign neoplasm: Secondary | ICD-10-CM | POA: Diagnosis not present

## 2018-06-18 DIAGNOSIS — D2221 Melanocytic nevi of right ear and external auricular canal: Secondary | ICD-10-CM | POA: Diagnosis not present

## 2018-06-18 DIAGNOSIS — Z808 Family history of malignant neoplasm of other organs or systems: Secondary | ICD-10-CM | POA: Diagnosis not present

## 2018-06-18 DIAGNOSIS — D225 Melanocytic nevi of trunk: Secondary | ICD-10-CM | POA: Diagnosis not present

## 2018-06-18 DIAGNOSIS — B078 Other viral warts: Secondary | ICD-10-CM | POA: Diagnosis not present

## 2018-06-18 DIAGNOSIS — H01119 Allergic dermatitis of unspecified eye, unspecified eyelid: Secondary | ICD-10-CM | POA: Diagnosis not present

## 2018-06-18 DIAGNOSIS — D485 Neoplasm of uncertain behavior of skin: Secondary | ICD-10-CM | POA: Diagnosis not present

## 2018-06-18 DIAGNOSIS — D2230 Melanocytic nevi of unspecified part of face: Secondary | ICD-10-CM | POA: Diagnosis not present

## 2018-06-18 DIAGNOSIS — L821 Other seborrheic keratosis: Secondary | ICD-10-CM | POA: Diagnosis not present

## 2018-07-01 DIAGNOSIS — D485 Neoplasm of uncertain behavior of skin: Secondary | ICD-10-CM | POA: Diagnosis not present

## 2018-07-01 DIAGNOSIS — L988 Other specified disorders of the skin and subcutaneous tissue: Secondary | ICD-10-CM | POA: Diagnosis not present

## 2018-08-09 ENCOUNTER — Ambulatory Visit (INDEPENDENT_AMBULATORY_CARE_PROVIDER_SITE_OTHER): Payer: 59 | Admitting: Podiatry

## 2018-08-09 ENCOUNTER — Encounter: Payer: Self-pay | Admitting: Podiatry

## 2018-08-09 ENCOUNTER — Ambulatory Visit (INDEPENDENT_AMBULATORY_CARE_PROVIDER_SITE_OTHER): Payer: 59

## 2018-08-09 DIAGNOSIS — S9032XA Contusion of left foot, initial encounter: Secondary | ICD-10-CM | POA: Diagnosis not present

## 2018-08-09 DIAGNOSIS — M779 Enthesopathy, unspecified: Secondary | ICD-10-CM

## 2018-08-09 MED ORDER — TRIAMCINOLONE ACETONIDE 10 MG/ML IJ SUSP
10.0000 mg | Freq: Once | INTRAMUSCULAR | Status: AC
  Administered 2018-08-09: 10 mg

## 2018-08-09 NOTE — Progress Notes (Signed)
Subjective:   Patient ID: Sherry Jacobs, female   DOB: 52 y.o.   MRN: [id number]   HPI Patient presents stating she is traumatized her left foot several times over the last 6 weeks and is been sore and she is concerned about the redness   ROS      Objective:  Physical Exam  Neurovascular status intact with inflammation discomfort dorsal left fifth metatarsal with fluid buildup around the joint surface with mild to moderate tenderness especially when I move the toe with a plantarflexed position     Assessment:  Inflammatory changes with tailor's bunion deformity and fluid buildup left fifth MPJ with trauma as a complicating factor     Plan:  H&P x-ray reviewed and today I did a careful injection of the dorsal tendon complex 3 mg Kenalog 5 mg Xylocaine advised on reduced activity ice therapy and this should resolve and will be seen back as needed  X-ray indicates no indications of fracture or bony pathological process

## 2018-10-27 NOTE — Progress Notes (Deleted)
Office Visit Note  Patient: Sherry Jacobs             Date of Birth: 15-Oct-1966           MRN: [id number]             PCP: Ann Held, DO Referring: Ann Held, * Visit Date: 11/10/2018 Occupation: @GUAROCC @  Subjective:  No chief complaint on file.  Plaquenil 200 mg every other day.  Last Plaquenil eye exam normal on 08/14/2017.  Most recent CBC/CMP within normal limits except increased creatinine on 06/10/2018.  Due for CBC/CMP today and then every 5 months.  Recommend annual flu, Prevnar 13, and Shingrix vaccine as indicated.  History of Present Illness: Sherry Jacobs is a 52 y.o. female with history of autoimmune disease, fibromyalgia, and Raynaud's disease.  Activities of Daily Living:  Patient reports morning stiffness for *** {minute/hour:19697}.   Patient {ACTIONS;DENIES/REPORTS:21021675::"Denies"} nocturnal pain.  Difficulty dressing/grooming: {ACTIONS;DENIES/REPORTS:21021675::"Denies"} Difficulty climbing stairs: {ACTIONS;DENIES/REPORTS:21021675::"Denies"} Difficulty getting out of chair: {ACTIONS;DENIES/REPORTS:21021675::"Denies"} Difficulty using hands for taps, buttons, cutlery, and/or writing: {ACTIONS;DENIES/REPORTS:21021675::"Denies"}  No Rheumatology ROS completed.   PMFS History:  Patient Active Problem List   Diagnosis Date Noted  . Preventative health care 01/02/2018  . URI (upper respiratory infection) 02/19/2017  . Neck strain 02/19/2017  . High risk medication use 01/02/2017  . Right ankle pain 08/05/2013  . Cellulitis, toe 03/21/2012  . UNSPECIFIED VITAMIN D DEFICIENCY 02/02/2009  . 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 LEG PAIN, LEFT 10/28/2008  . ANXIETY 06/23/2008  . UNSPECIFIED OSTEOPOROSIS 12/29/2007  . HYPERLIPIDEMIA 10/02/2007  . INJURY, 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 09/09/2007  . Palpitations 09/09/2007  . ANA POSITIVE, HX OF 09/09/2007  . IRRITABLE BOWEL SYNDROME 04/11/2007  . FIBROMYALGIA 04/11/2007    Past Medical  History:  Diagnosis Date  . ANA positive   . Arthralgia   . Autoimmune disease ([place])    ANA positive  . Endometrial polyp   . IBS (irritable bowel syndrome)   . Iron deficiency anemia   . Lactose intolerance in adult   . PONV (postoperative nausea and vomiting)   . Social anxiety disorder   . Wears contact lenses     Family History  Problem Relation Age of Onset  . Breast cancer Mother   . Colon polyps Father   . Diabetes Unknown   . Hypertension Unknown   . Alcohol abuse Unknown   . Hyperlipidemia Unknown   . Arthritis Unknown   . Stroke Unknown   . Thyroid disease Unknown   . Uterine cancer Unknown   . Breast cancer Paternal Grandmother   . Melanoma Maternal Grandfather   . Asthma Daughter        exercise induced   . Asthma Son        exercise induced    Past Surgical History:  Procedure Laterality Date  . APPENDECTOMY  1981  . DILATATION & CURETTAGE/HYSTEROSCOPY WITH MYOSURE N/A 10/28/2014   Procedure: DILATATION & CURETTAGE/HYSTEROSCOPY WITH MYOSURE;  Surgeon: Marylynn Pearson, MD;  Location: [facility];  Service: Gynecology;  Laterality: N/A;  . 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 Go to the Rheumatology activity and complete the homunculus joint exam.  Investigation: No additional findings.  Imaging: No results  found.  Recent Labs: Lab Results  Component Value Date   WBC 5.4 06/10/2018   HGB 13.4 06/10/2018   PLT 246 06/10/2018   NA 139 06/10/2018   K 4.4 06/10/2018   CL 103 06/10/2018   CO2 29 06/10/2018   GLUCOSE 91 06/10/2018   BUN 15 06/10/2018   CREATININE 1.09 (H) 06/10/2018   BILITOT 0.4 06/10/2018   ALKPHOS 50 06/03/2017   AST 21 06/10/2018   ALT 23 06/10/2018   PROT 6.9 06/10/2018   ALBUMIN 4.6 06/03/2017   CALCIUM 9.7 06/10/2018   GFRAA 67 06/10/2018    Speciality Comments: PLQ EYe Exam: 08/14/17 WNL @ [facility].  Procedures:  No procedures performed Allergies: Sulfa antibiotics   Assessment / Plan:     Visit Diagnoses: Autoimmune disease ([place]) - +ANA, low C3, arthralgia, fatigue, Sicca symptoms, and Raynaud's:   High risk medication use - PLQ. PLQ Eye Exam: 08/14/17 WNL @ [facility].  Raynaud's disease without gangrene  Sicca syndrome, unspecified ([facility])  Trochanteric bursitis, right hip  Trochanteric bursitis, left hip  Fibromyalgia  Other fatigue  History of hyperlipidemia  History of IBS  History of anxiety   Orders: No orders of the defined types were placed in this encounter.  No orders of the defined types were placed in this encounter.   Face-to-face time spent with patient was *** minutes. Greater than 50% of time was spent in counseling and coordination of care.  Follow-Up Instructions: No follow-ups on file.   Ofilia Neas, PA-C  Note - This record has been created using Dragon software.  Chart creation errors have been sought, but may not always  have been located. Such creation errors do not reflect on  the standard of medical care.

## 2018-10-31 DIAGNOSIS — Z79899 Other long term (current) drug therapy: Secondary | ICD-10-CM | POA: Diagnosis not present

## 2018-11-05 DIAGNOSIS — H04123 Dry eye syndrome of bilateral lacrimal glands: Secondary | ICD-10-CM | POA: Diagnosis not present

## 2018-11-05 DIAGNOSIS — Z79899 Other long term (current) drug therapy: Secondary | ICD-10-CM | POA: Diagnosis not present

## 2018-11-05 DIAGNOSIS — H5203 Hypermetropia, bilateral: Secondary | ICD-10-CM | POA: Diagnosis not present

## 2018-11-07 ENCOUNTER — Other Ambulatory Visit: Payer: Self-pay

## 2018-11-07 ENCOUNTER — Other Ambulatory Visit: Payer: Self-pay | Admitting: *Deleted

## 2018-11-07 DIAGNOSIS — Z79899 Other long term (current) drug therapy: Secondary | ICD-10-CM

## 2018-11-07 DIAGNOSIS — M359 Systemic involvement of connective tissue, unspecified: Secondary | ICD-10-CM

## 2018-11-10 ENCOUNTER — Ambulatory Visit: Payer: 59 | Admitting: Physician Assistant

## 2018-11-10 ENCOUNTER — Telehealth: Payer: Self-pay | Admitting: Rheumatology

## 2018-11-10 ENCOUNTER — Encounter: Payer: Self-pay | Admitting: Family Medicine

## 2018-11-10 DIAGNOSIS — B078 Other viral warts: Secondary | ICD-10-CM | POA: Diagnosis not present

## 2018-11-10 DIAGNOSIS — Z23 Encounter for immunization: Secondary | ICD-10-CM | POA: Diagnosis not present

## 2018-11-10 MED ORDER — HYDROXYCHLOROQUINE SULFATE 200 MG PO TABS: ORAL_TABLET | ORAL | 0 refills | Status: DC

## 2018-11-10 MED FILL — HYDROXYCHLOROQUINE SULFATE: 200 | 90 days supply | Qty: 45 | Fill #0

## 2018-11-10 NOTE — Telephone Encounter (Signed)
-----   Message from Ofilia Neas, PA-C sent at 11/10/2018  8:07 AM EST ----- UA reveals findings consistent with a UTI.  Please notify patient and forward results to PCP.  Pt should follow up with PCP for further evaluation.  Sed rate WNL. Complements WNL. Vitamin D is within desirable range.  Please advise patient to continu e on a maintenance dose of vitamin D.

## 2018-11-10 NOTE — Progress Notes (Signed)
UA reveals findings consistent with a UTI.  Please notify patient and forward results to PCP.  Pt should follow up with PCP for further evaluation.  Sed rate WNL. Complements WNL. Vitamin D is within desirable range.  Please advise patient to continue on a maintenance dose of vitamin D.

## 2018-11-10 NOTE — Telephone Encounter (Signed)
Last Visit: 06/10/18 Next visit: 01/23/19 Labs: 11/07/18 Glucose is 110. Rest of CMP WNL. CBC WNL. PLQ Eye Exam: 11/05/18 WNL  Okay to refill per Dr. Estanislado Pandy

## 2018-11-10 NOTE — Telephone Encounter (Signed)
Patient left a voicemail stating she was returning your call regarding her labwork results. 

## 2018-11-11 LAB — COMPLETE METABOLIC PANEL WITH GFR
AG Ratio: 1.8 (calc) (ref 1.0–2.5)
ALT: 20 U/L (ref 6–29)
AST: 19 U/L (ref 10–35)
Albumin: 4.7 g/dL (ref 3.6–5.1)
Alkaline phosphatase (APISO): 68 U/L (ref 33–130)
BUN: 16 mg/dL (ref 7–25)
CO2: 29 mmol/L (ref 20–32)
Calcium: 9.9 mg/dL (ref 8.6–10.4)
Chloride: 102 mmol/L (ref 98–110)
Creat: 0.91 mg/dL (ref 0.50–1.05)
GFR, EST AFRICAN AMERICAN: 83 mL/min/{1.73_m2} (ref >=60)
GFR, Est Non African American: 72 mL/min/{1.73_m2} (ref >=60)
Globulin: 2.6 g/dL (calc) (ref 1.9–3.7)
Glucose, Bld: 110 mg/dL — ABNORMAL HIGH (ref 65–99)
Potassium: 3.7 mmol/L (ref 3.5–5.3)
Sodium: 139 mmol/L (ref 135–146)
TOTAL PROTEIN: 7.3 g/dL (ref 6.1–8.1)
Total Bilirubin: 0.3 mg/dL (ref 0.2–1.2)

## 2018-11-11 LAB — CBC WITH DIFFERENTIAL/PLATELET
Absolute Monocytes: 443 cells/uL (ref 200–950)
Basophils Absolute: 38 cells/uL (ref 0–200)
Basophils Relative: 0.7 %
Eosinophils Absolute: 81 cells/uL (ref 15–500)
Eosinophils Relative: 1.5 %
HCT: 39.6 % (ref 35.0–45.0)
Hemoglobin: 13.6 g/dL (ref 11.7–15.5)
LYMPHS ABS: 1685 {cells}/uL (ref 850–3900)
MCH: 28.5 pg (ref 27.0–33.0)
MCHC: 34.3 g/dL (ref 32.0–36.0)
MCV: 83 fL (ref 80.0–100.0)
MPV: 11.1 fL (ref 7.5–12.5)
Monocytes Relative: 8.2 %
NEUTROS ABS: 3154 {cells}/uL (ref 1500–7800)
NEUTROS PCT: 58.4 %
Platelets: 276 10*3/uL (ref 140–400)
RBC: 4.77 10*6/uL (ref 3.80–5.10)
RDW: 13 % (ref 11.0–15.0)
Total Lymphocyte: 31.2 %
WBC: 5.4 10*3/uL (ref 3.8–10.8)

## 2018-11-11 LAB — URINALYSIS, ROUTINE W REFLEX MICROSCOPIC
BILIRUBIN URINE: NEGATIVE
Bacteria, UA: NONE SEEN /HPF
Glucose, UA: NEGATIVE
Hgb urine dipstick: NEGATIVE
Hyaline Cast: NONE SEEN /LPF
KETONES UR: NEGATIVE
NITRITE: NEGATIVE
Protein, ur: NEGATIVE
RBC / HPF: NONE SEEN /HPF (ref 0–2)
Specific Gravity, Urine: 1.014 (ref 1.001–1.03)
Squamous Epithelial / HPF: NONE SEEN /HPF (ref <=5)
pH: 6 (ref 5.0–8.0)

## 2018-11-11 LAB — ANTI-NUCLEAR AB-TITER (ANA TITER)
ANA TITER: 1:320 {titer} — ABNORMAL HIGH
ANA Titer 1: 1:320 {titer} — ABNORMAL HIGH

## 2018-11-11 LAB — C3 AND C4
C3 Complement: 138 mg/dL (ref 83–193)
C4 Complement: 35 mg/dL (ref 15–57)

## 2018-11-11 LAB — ANTI-DNA ANTIBODY, DOUBLE-STRANDED: ds DNA Ab: <1 IU/mL

## 2018-11-11 LAB — SEDIMENTATION RATE: Sed Rate: 6 mm/h (ref 0–30)

## 2018-11-11 LAB — VITAMIN D 25 HYDROXY (VIT D DEFICIENCY, FRACTURES): Vit D, 25-Hydroxy: 34 ng/mL (ref 30–100)

## 2018-11-11 LAB — ANA: Anti Nuclear Antibody(ANA): POSITIVE — AB

## 2018-11-11 NOTE — Progress Notes (Signed)
DsDNA negative. ANA remains positive

## 2018-11-11 NOTE — Telephone Encounter (Signed)
It looks like an infection ----- is she able to come by today to leave a sample so we can send in a culture? Does she have any symptoms ?

## 2018-11-12 ENCOUNTER — Other Ambulatory Visit: Payer: Self-pay | Admitting: *Deleted

## 2018-11-12 ENCOUNTER — Other Ambulatory Visit: Payer: 59

## 2018-11-12 DIAGNOSIS — R82998 Other abnormal findings in urine: Secondary | ICD-10-CM

## 2018-11-13 LAB — URINE CULTURE
MICRO NUMBER: 91514700
SPECIMEN QUALITY:: ADEQUATE

## 2018-11-21 DIAGNOSIS — Z1231 Encounter for screening mammogram for malignant neoplasm of breast: Secondary | ICD-10-CM | POA: Diagnosis not present

## 2018-11-21 DIAGNOSIS — M3500 Sicca syndrome, unspecified: Secondary | ICD-10-CM | POA: Diagnosis not present

## 2018-11-21 DIAGNOSIS — I7300 Raynaud's syndrome without gangrene: Secondary | ICD-10-CM | POA: Diagnosis not present

## 2018-11-21 DIAGNOSIS — Z803 Family history of malignant neoplasm of breast: Secondary | ICD-10-CM | POA: Diagnosis not present

## 2018-11-21 DIAGNOSIS — D8989 Other specified disorders involving the immune mechanism, not elsewhere classified: Secondary | ICD-10-CM | POA: Diagnosis not present

## 2018-12-01 ENCOUNTER — Telehealth: Payer: Self-pay | Admitting: Rheumatology

## 2018-12-01 NOTE — Telephone Encounter (Signed)
Patient was seen in the office recently.  She did not have active autoimmune disease.  I also reviewed her labs from November 07, 2018 done by her PCP which showed ANA titer of 1: 320 speckled pattern, double-stranded DNA was negative complements were normal and sed rate was 6.  The labs did not show active disease.  She has been on low-dose of Plaquenil which is a subtherapeutic dose.  Her labs done on November 21, 2018 for hydroxychloroquine level show a value of 249 which is subtherapeutic.  The normal value is between [phone].  A subtherapeutic level is expected because she is taking a subtherapeutic dose.  As patient is clinically doing well I do not see any need to increase her dose of Plaquenil.  I have left a message on answering machine for patient to call us back. Bo Merino, MD

## 2018-12-03 NOTE — Telephone Encounter (Signed)
It is okay for her to take hepatitis B vaccine as it is not a live vaccine.

## 2018-12-03 NOTE — Telephone Encounter (Signed)
Patient advised it is okay for her to take hepatitis B vaccine as it is not a live vaccine.

## 2018-12-03 NOTE — Telephone Encounter (Signed)
Patient advised she did not have active autoimmune disease.  I also reviewed her labs from November 07, 2018 done by her PCP which showed ANA titer of 1: 320 speckled pattern, double-stranded DNA was negative complements were normal and sed rate was 6.  The labs did not show active disease.  She has been on low-dose of Plaquenil which is a subtherapeutic dose.  Her labs done on November 21, 2018 for hydroxychloroquine level show a value of 249 which is subtherapeutic.  The normal value is between [phone].  A subtherapeutic level is expected because she is taking a subtherapeutic dose.  As patient is clinically doing well I do not see any need to increase her dose of Plaquenil.   Patient states she was seen by employee health and had a Hepatitis B titer and is awaiting the results. Patient would like to now if it comes back that she needs the booster for Hepatitis B will it be okay for her to get. Patient is on PLQ.

## 2018-12-04 DIAGNOSIS — R1031 Right lower quadrant pain: Secondary | ICD-10-CM | POA: Diagnosis not present

## 2018-12-04 DIAGNOSIS — R82998 Other abnormal findings in urine: Secondary | ICD-10-CM | POA: Diagnosis not present

## 2018-12-05 ENCOUNTER — Encounter

## 2018-12-05 ENCOUNTER — Ambulatory Visit (INDEPENDENT_AMBULATORY_CARE_PROVIDER_SITE_OTHER): Payer: 59 | Admitting: Family Medicine

## 2018-12-05 ENCOUNTER — Encounter: Payer: Self-pay | Admitting: Family Medicine

## 2018-12-05 ENCOUNTER — Ambulatory Visit (HOSPITAL_BASED_OUTPATIENT_CLINIC_OR_DEPARTMENT_OTHER)
Admission: RE | Admit: 2018-12-05 | Discharge: 2018-12-05 | Disposition: A | Discharge status: Home / Self Care | Payer: 59 | Source: Ambulatory Visit | Attending: Family Medicine | Admitting: Family Medicine

## 2018-12-05 ENCOUNTER — Ambulatory Visit: Payer: 59 | Admitting: Family Medicine

## 2018-12-05 VITALS — BP 118/70 | HR 84 | Temp 98.1°F | Resp 16 | Ht 67.0 in | Wt 194.6 lb

## 2018-12-05 DIAGNOSIS — R1031 Right lower quadrant pain: Secondary | ICD-10-CM | POA: Insufficient documentation

## 2018-12-05 DIAGNOSIS — M67431 Ganglion, right wrist: Secondary | ICD-10-CM

## 2018-12-05 MED ORDER — IOPAMIDOL (ISOVUE-300) INJECTION 61%
100.0000 mL | Freq: Once | INTRAVENOUS | Status: AC | PRN
  Administered 2018-12-05: 100 mL via INTRAVENOUS

## 2018-12-05 NOTE — Patient Instructions (Signed)
Abdominal Pain, Adult  Abdominal pain can be caused by many things. Often, abdominal pain is not serious and it gets better with no treatment or by being treated at home. However, sometimes abdominal pain is serious. Your health care provider will do a medical history and a physical exam to try to determine the cause of your abdominal pain.  Follow these instructions at home:   Take over-the-counter and prescription medicines only as told by your health care provider. Do not take a laxative unless told by your health care provider.   Drink enough fluid to keep your urine clear or pale yellow.   Watch your condition for any changes.   Keep all follow-up visits as told by your health care provider. This is important.  Contact a health care provider if:   Your abdominal pain changes or gets worse.   You are not hungry or you lose weight without trying.   You are constipated or have diarrhea for more than 2-3 days.   You have pain when you urinate or have a bowel movement.   Your abdominal pain wakes you up at night.   Your pain gets worse with meals, after eating, or with certain foods.   You are throwing up and cannot keep anything down.   You have a fever.  Get help right away if:   Your pain does not go away as soon as your health care provider told you to expect.   You cannot stop throwing up.   Your pain is only in areas of the abdomen, such as the right side or the left lower portion of the abdomen.   You have bloody or black stools, or stools that look like tar.   You have severe pain, cramping, or bloating in your abdomen.   You have signs of dehydration, such as:  ? Dark urine, very little urine, or no urine.  ? Cracked lips.  ? Dry mouth.  ? Sunken eyes.  ? Sleepiness.  ? Weakness.  This information is not intended to replace advice given to you by your health care provider. Make sure you discuss any questions you have with your health care provider.  Document Released: 08/22/2005 Document  Revised: 06/01/2016 Document Reviewed: 04/25/2016  Elsevier Interactive Patient Education  2019 [employer]

## 2018-12-05 NOTE — Progress Notes (Signed)
Patient ID: Sherry Jacobs, female    DOB: Sep 27, 1966  Age: 53 y.o. MRN: [id number]    Subjective:  Subjective  HPI Sherry Jacobs presents for RLQ pain since Tuesday.  She saw her ob and ovaries were fine on [place] .  Pt is a little better today but still painful.  She has had her app out.  No NVD.     Review of Systems  Constitutional: Negative for appetite change, diaphoresis, fatigue and unexpected weight change.  Eyes: Negative for pain, redness and visual disturbance.  Respiratory: Negative for cough, chest tightness, shortness of breath and wheezing.   Cardiovascular: Negative for chest pain, palpitations and leg swelling.  Gastrointestinal: Positive for abdominal pain and nausea. Negative for abdominal distention, anal bleeding, blood in stool, constipation and diarrhea.  Endocrine: Negative for cold intolerance, heat intolerance, polydipsia, polyphagia and polyuria.  Genitourinary: Negative for difficulty urinating, dysuria and frequency.  Musculoskeletal: Positive for joint swelling.  Neurological: Negative for dizziness, light-headedness, numbness and headaches.    History Past Medical History:  Diagnosis Date  . ANA positive   . Arthralgia   . 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 Endometrial polyp   . IBS (irritable bowel syndrome)   . Iron deficiency anemia   . Lactose intolerance in adult   . PONV (postoperative nausea and vomiting)   . Social anxiety disorder   . Wears contact lenses     She has a past surgical history that includes Appendectomy (1981); RIGHT MODIFIED NECK DISSECTION W/ REMOVAL BENIGN RIGHT PARAPHARYNGEAL SPACE MASS AND LYMPH NODES (08-02-2004); and Dilatation & curettage/hysteroscopy with myosure (N/A, 10/28/2014).   Her family history includes Alcohol abuse in her unknown relative; Arthritis in her unknown relative; Asthma in her daughter and son; Breast cancer in her mother and paternal grandmother; Colon polyps in her father; Diabetes  in her unknown relative; Hyperlipidemia in her unknown relative; Hypertension in her unknown relative; Melanoma in her maternal grandfather; Stroke in her unknown relative; Thyroid disease in her unknown relative; Uterine cancer in her unknown relative.She reports that she has never smoked. She has never used smokeless tobacco. She reports current alcohol use. She reports that she does not use drugs.  Current Outpatient Medications on File Prior to Visit  Medication Sig Dispense Refill  . ALPRAZolam (XANAX) 0.25 MG tablet Take 0.25 mg by mouth at bedtime as needed for anxiety.    . Cholecalciferol (VITAMIN D3) 5000 units CAPS Take by mouth 2 (two) times a week.    . hydroxychloroquine (PLAQUENIL) 200 MG tablet Take 1 tablet (200 mg) by mouth every other day. 45 tablet 0  . Multiple Vitamin (MULTIVITAMIN) tablet Take 1 tablet by mouth daily.      . propranolol (INDERAL) 20 MG tablet Take 20 mg by mouth as needed.      No current facility-administered medications on file prior to visit.      Objective:  Objective  Physical Exam Vitals signs and nursing note reviewed.  Constitutional:      Appearance: She is well-developed.  HENT:     Head: Normocephalic and atraumatic.     Right Ear: External ear normal.     Left Ear: External ear normal.  Eyes:     General:        Right eye: No discharge.        Left eye: No discharge.     Conjunctiva/sclera: Conjunctivae normal.  Neck:     Musculoskeletal: Normal range of  motion and neck supple.     Thyroid: No thyromegaly.     Vascular: No carotid bruit or JVD.  Cardiovascular:     Rate and Rhythm: Normal rate and regular rhythm.     Heart sounds: Normal heart sounds. No murmur.  Pulmonary:     Effort: Pulmonary effort is normal. No respiratory distress.     Breath sounds: Normal breath sounds. No wheezing or rales.  Chest:     Chest wall: No tenderness.  Abdominal:     Tenderness: There is abdominal tenderness in the right lower quadrant.  There is guarding. There is no rebound.  Musculoskeletal:     Right wrist: She exhibits tenderness and swelling.       Arms:  Lymphadenopathy:     Cervical: Cervical adenopathy present.  Neurological:     Mental Status: She is alert and oriented to person, place, and time.    BP 118/70 (BP Location: Right Arm, Cuff Size: Normal)   Pulse 84   Temp 98.1 F (36.7 C) (Oral)   Resp 16   Ht 5\' 7"  (1.702 m)   Wt 194 lb 9.6 oz (88.3 kg)   LMP 04/07/2017   SpO2 98%   BMI 30.48 kg/m  Wt Readings from Last 3 Encounters:  12/05/18 194 lb 9.6 oz (88.3 kg)  06/10/18 185 lb (83.9 kg)  05/05/18 182 lb (82.6 kg)     Lab Results  Component Value Date   WBC 5.4 11/07/2018   HGB 13.6 11/07/2018   HCT 39.6 11/07/2018   PLT 276 11/07/2018   GLUCOSE 110 (H) 11/07/2018   CHOL 247 (H) 03/24/2018   TRIG 79.0 03/24/2018   HDL 70.00 03/24/2018   LDLDIRECT 137.7 07/31/2013   LDLCALC 161 (H) 03/24/2018   ALT 20 11/07/2018   AST 19 11/07/2018   NA 139 11/07/2018   K 3.7 11/07/2018   CL 102 11/07/2018   CREATININE 0.91 11/07/2018   BUN 16 11/07/2018   CO2 29 11/07/2018   TSH 3.21 03/24/2018    No results found.   Assessment & Plan:  Plan  I am having Sherry Jacobs maintain her multivitamin, propranolol, ALPRAZolam, Vitamin D3, and hydroxychloroquine.  No orders of the defined types were placed in this encounter.   Problem List Items Addressed This Visit    None    Visit Diagnoses    Ganglion cyst of wrist, right    -  Primary   Relevant Orders   Ambulatory referral to Orthopedic Surgery   Right lower quadrant abdominal pain       Relevant Orders   CT Abdomen Pelvis W Contrast      If symptoms worsen go to ER Labs done with rheum-- cbcd, cmp normal  Lab Results  Component Value Date   WBC 5.4 11/07/2018   HGB 13.6 11/07/2018   HCT 39.6 11/07/2018   MCV 83.0 11/07/2018   PLT 276 11/07/2018   Lab Results  Component Value Date   BUN 16 11/07/2018   Lab Results    Component Value Date   CREATININE 0.91 11/07/2018     - Follow-up: Return if symptoms worsen or fail to improve.  Ann Held, DO

## 2019-01-08 ENCOUNTER — Encounter: Payer: 59 | Admitting: Family Medicine

## 2019-01-09 NOTE — Progress Notes (Deleted)
Office Visit Note  Patient: Sherry Jacobs             Date of Birth: 04/28/1966           MRN: [id number]             PCP: Ann Held, DO Referring: Ann Held, * Visit Date: 01/23/2019 Occupation: @GUAROCC @  Subjective:  No chief complaint on file.  Plaquenil 200 mg every other day.  Plaquenil eye exam normal on 11/05/2018.  Most recent CBC/CMP within normal limits on 11/07/2018.  Will monitor every 5 months.  History of Present Illness: Sherry Jacobs is a 53 y.o. female ***   Activities of Daily Living:  Patient reports morning stiffness for *** {minute/hour:19697}.   Patient {ACTIONS;DENIES/REPORTS:21021675::"Denies"} nocturnal pain.  Difficulty dressing/grooming: {ACTIONS;DENIES/REPORTS:21021675::"Denies"} Difficulty climbing stairs: {ACTIONS;DENIES/REPORTS:21021675::"Denies"} Difficulty getting out of chair: {ACTIONS;DENIES/REPORTS:21021675::"Denies"} Difficulty using hands for taps, buttons, cutlery, and/or writing: {ACTIONS;DENIES/REPORTS:21021675::"Denies"}  No Rheumatology ROS completed.   PMFS History:  Patient Active Problem List   Diagnosis Date Noted  . Preventative health care 01/02/2018  . URI (upper respiratory infection) 02/19/2017  . Neck strain 02/19/2017  . High risk medication use 01/02/2017  . Right ankle pain 08/05/2013  . Cellulitis, toe 03/21/2012  . UNSPECIFIED VITAMIN D DEFICIENCY 02/02/2009  . 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 LEG PAIN, LEFT 10/28/2008  . ANXIETY 06/23/2008  . UNSPECIFIED OSTEOPOROSIS 12/29/2007  . HYPERLIPIDEMIA 10/02/2007  . INJURY, 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 09/09/2007  . Palpitations 09/09/2007  . ANA POSITIVE, HX OF 09/09/2007  . IRRITABLE BOWEL SYNDROME 04/11/2007  . FIBROMYALGIA 04/11/2007    Past Medical History:  Diagnosis Date  . ANA positive   . Arthralgia   . 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 Endometrial polyp   . IBS (irritable bowel syndrome)   . Iron deficiency anemia     . Lactose intolerance in adult   . PONV (postoperative nausea and vomiting)   . Social anxiety disorder   . Wears contact lenses     Family History  Problem Relation Age of Onset  . Breast cancer Mother   . Colon polyps Father   . Diabetes Unknown   . Hypertension Unknown   . Alcohol abuse Unknown   . Hyperlipidemia Unknown   . Arthritis Unknown   . Stroke Unknown   . Thyroid disease Unknown   . Uterine cancer Unknown   . Breast cancer Paternal Grandmother   . Melanoma Maternal Grandfather   . Asthma Daughter        exercise induced   . Asthma Son        exercise induced    Past Surgical History:  Procedure Laterality Date  . APPENDECTOMY  1981  . DILATATION & CURETTAGE/HYSTEROSCOPY WITH MYOSURE N/A 10/28/2014   Procedure: DILATATION & CURETTAGE/HYSTEROSCOPY WITH MYOSURE;  Surgeon: Marylynn Pearson, MD;  Location: [facility];  Service: Gynecology;  Laterality: N/A;  . 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 H1N1 10/13/2008  . Influenza Whole 11/26/2004, 09/15/2008, 08/30/2010  .  Influenza-Unspecified 08/27/2011, 08/26/2016  . Pneumococcal Polysaccharide-23 05/11/2011  . Td 12/15/2008     Objective: Vital Signs: LMP 04/07/2017    Physical Exam   Musculoskeletal Exam: ***  CDAI Exam: CDAI Score: Not documented Patient Global Assessment: Not documented; Provider Global Assessment: Not documented Swollen: Not documented; Tender: Not documented Joint Exam   Not documented   There is currently no information documented on the homunculus. Go to the Rheumatology activity and complete the homunculus joint  exam.  Investigation: No additional findings.  Imaging: No results found.  Recent Labs: Lab Results  Component Value Date   WBC 5.4 11/07/2018   HGB 13.6 11/07/2018   PLT 276 11/07/2018   NA 139 11/07/2018   K 3.7 11/07/2018   CL 102 11/07/2018   CO2 29 11/07/2018   GLUCOSE 110 (H) 11/07/2018   BUN 16 11/07/2018   CREATININE 0.91 11/07/2018   BILITOT 0.3 11/07/2018   ALKPHOS 50 06/03/2017   AST 19 11/07/2018   ALT 20 11/07/2018   PROT 7.3 11/07/2018   ALBUMIN 4.6 06/03/2017   CALCIUM 9.9 11/07/2018   GFRAA 83 11/07/2018    Speciality Comments: PLQ EYe Exam: 11/05/18 WNL @ [facility].  Procedures:  No procedures performed Allergies: Sulfa antibiotics   Assessment / Plan:     Visit Diagnoses: No diagnosis found.   Orders: No orders of the defined types were placed in this encounter.  No orders of the defined types were placed in this encounter.   Face-to-face time spent with patient was *** minutes. Greater than 50% of time was spent in counseling and coordination of care.  Follow-Up Instructions: No follow-ups on file.   Earnestine Mealing, CMA  Note - This record has been created using Editor, commissioning.  Chart creation errors have been sought, but may not always  have been located. Such creation errors do not reflect on  the standard of medical care.

## 2019-01-23 ENCOUNTER — Ambulatory Visit: Payer: 59 | Admitting: Rheumatology

## 2019-02-12 ENCOUNTER — Other Ambulatory Visit: Payer: Self-pay | Admitting: Rheumatology

## 2019-02-13 MED ORDER — HYDROXYCHLOROQUINE SULFATE 200 MG PO TABS: ORAL_TABLET | ORAL | 1 refills | Status: DC

## 2019-02-13 MED FILL — HYDROXYCHLOROQUINE SULFATE: 200 | 90 days supply | Qty: 45 | Fill #0

## 2019-02-13 NOTE — Telephone Encounter (Signed)
Last Visit: 06/10/18 Next Visit: 05/05/19 Labs: 11/07/18 Glucose is 110. Rest of CMP WNL. CBC WNL. PLQ EYe Exam: 11/05/18 WNL  Okay to refill per Dr. Estanislado Pandy

## 2019-02-23 MED FILL — ALPRAZolam 0.25 MG TABS: 0.25 | 90 days supply | Qty: 360 | Fill #0

## 2019-02-23 MED FILL — PROPRANOLOL 20 MG TABLET: 20 | 90 days supply | Qty: 270 | Fill #0

## 2019-02-24 ENCOUNTER — Encounter: Payer: Self-pay | Admitting: Rheumatology

## 2019-03-17 DIAGNOSIS — Z6830 Body mass index (BMI) 30.0-30.9, adult: Secondary | ICD-10-CM | POA: Diagnosis not present

## 2019-03-17 DIAGNOSIS — Z01419 Encounter for gynecological examination (general) (routine) without abnormal findings: Secondary | ICD-10-CM | POA: Diagnosis not present

## 2019-03-19 ENCOUNTER — Other Ambulatory Visit: Payer: Self-pay | Admitting: Obstetrics and Gynecology

## 2019-03-19 DIAGNOSIS — Z9189 Other specified personal risk factors, not elsewhere classified: Secondary | ICD-10-CM

## 2019-04-07 DIAGNOSIS — F411 Generalized anxiety disorder: Secondary | ICD-10-CM | POA: Diagnosis not present

## 2019-04-14 DIAGNOSIS — F411 Generalized anxiety disorder: Secondary | ICD-10-CM | POA: Diagnosis not present

## 2019-04-22 NOTE — Progress Notes (Deleted)
Office Visit Note  Patient: Sherry Jacobs             Date of Birth: 1966-04-29           MRN: [id number]             PCP: Ann Held, DO Referring: Ann Held, * Visit Date: 05/05/2019 Occupation: @GUAROCC @  Subjective:  No chief complaint on file.   She is on Plaquenil 200 mg every other day.  Most recent CBC/CMP within normal limits on 11/07/2018.  Due for CBC/CMP today and will monitor every 5 months.  History of Present Illness: Sherry Jacobs is a 53 y.o. female ***   Activities of Daily Living:  Patient reports morning stiffness for *** {minute/hour:19697}.   Patient {ACTIONS;DENIES/REPORTS:21021675::"Denies"} nocturnal pain.  Difficulty dressing/grooming: {ACTIONS;DENIES/REPORTS:21021675::"Denies"} Difficulty climbing stairs: {ACTIONS;DENIES/REPORTS:21021675::"Denies"} Difficulty getting out of chair: {ACTIONS;DENIES/REPORTS:21021675::"Denies"} Difficulty using hands for taps, buttons, cutlery, and/or writing: {ACTIONS;DENIES/REPORTS:21021675::"Denies"}  No Rheumatology ROS completed.   PMFS History:  Patient Active Problem List   Diagnosis Date Noted  . Preventative health care 01/02/2018  . URI (upper respiratory infection) 02/19/2017  . Neck strain 02/19/2017  . High risk medication use 01/02/2017  . Right ankle pain 08/05/2013  . Cellulitis, toe 03/21/2012  . UNSPECIFIED VITAMIN D DEFICIENCY 02/02/2009  . 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 LEG PAIN, LEFT 10/28/2008  . ANXIETY 06/23/2008  . UNSPECIFIED OSTEOPOROSIS 12/29/2007  . HYPERLIPIDEMIA 10/02/2007  . INJURY, 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 09/09/2007  . Palpitations 09/09/2007  . ANA POSITIVE, HX OF 09/09/2007  . IRRITABLE BOWEL SYNDROME 04/11/2007  . FIBROMYALGIA 04/11/2007    Past Medical History:  Diagnosis Date  . ANA positive   . Arthralgia   . 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 Endometrial polyp   . IBS (irritable bowel syndrome)   . Iron deficiency anemia   .  Lactose intolerance in adult   . PONV (postoperative nausea and vomiting)   . Social anxiety disorder   . Wears contact lenses     Family History  Problem Relation Age of Onset  . Breast cancer Mother   . Colon polyps Father   . Diabetes Unknown   . Hypertension Unknown   . Alcohol abuse Unknown   . Hyperlipidemia Unknown   . Arthritis Unknown   . Stroke Unknown   . Thyroid disease Unknown   . Uterine cancer Unknown   . Breast cancer Paternal Grandmother   . Melanoma Maternal Grandfather   . Asthma Daughter        exercise induced   . Asthma Son        exercise induced    Past Surgical History:  Procedure Laterality Date  . APPENDECTOMY  1981  . DILATATION & CURETTAGE/HYSTEROSCOPY WITH MYOSURE N/A 10/28/2014   Procedure: DILATATION & CURETTAGE/HYSTEROSCOPY WITH MYOSURE;  Surgeon: Marylynn Pearson, MD;  Location: [facility];  Service: Gynecology;  Laterality: N/A;  . 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 H1N1 10/13/2008  . Influenza Whole 11/26/2004, 09/15/2008,  08/30/2010  . Influenza-Unspecified 08/27/2011, 08/26/2016  . Pneumococcal Polysaccharide-23 05/11/2011  . Td 12/15/2008     Objective: Vital Signs: LMP 04/07/2017    Physical Exam   Musculoskeletal Exam: ***  CDAI Exam: CDAI Score: Not documented Patient Global Assessment: Not documented; Provider Global Assessment: Not documented Swollen: Not documented; Tender: Not documented Joint Exam   Not documented   There is currently no information documented on the homunculus. Go to the Rheumatology activity and complete the homunculus joint exam.   Investigation: No additional findings.  Imaging: No results found.  Recent Labs: Lab Results  Component Value Date   WBC 5.4 11/07/2018   HGB 13.6 11/07/2018   PLT 276 11/07/2018   NA 139 11/07/2018   K 3.7 11/07/2018   CL 102 11/07/2018   CO2 29 11/07/2018   GLUCOSE 110 (H) 11/07/2018   BUN 16 11/07/2018   CREATININE 0.91 11/07/2018   BILITOT 0.3 11/07/2018   ALKPHOS 50 06/03/2017   AST 19 11/07/2018   ALT 20 11/07/2018   PROT 7.3 11/07/2018   ALBUMIN 4.6 06/03/2017   CALCIUM 9.9 11/07/2018   GFRAA 83 11/07/2018    Speciality Comments: PLQ EYe Exam: 11/05/18 WNL @ [facility].  Procedures:  No procedures performed Allergies: Sulfa antibiotics   Assessment / Plan:     Visit Diagnoses: No diagnosis found.   Orders: No orders of the defined types were placed in this encounter.  No orders of the defined types were placed in this encounter.   Face-to-face time spent with patient was *** minutes. Greater than 50% of time was spent in counseling and coordination of care.  Follow-Up Instructions: No follow-ups on file.   Ofilia Neas, PA-C  Note - This record has been created using Dragon software.  Chart creation errors have been sought, but may not always  have been located. Such creation errors do not reflect on  the standard of medical care.

## 2019-04-24 DIAGNOSIS — F411 Generalized anxiety disorder: Secondary | ICD-10-CM | POA: Diagnosis not present

## 2019-04-29 DIAGNOSIS — F411 Generalized anxiety disorder: Secondary | ICD-10-CM | POA: Diagnosis not present

## 2019-05-05 ENCOUNTER — Ambulatory Visit: Payer: Self-pay | Admitting: Physician Assistant

## 2019-05-11 ENCOUNTER — Encounter: Payer: 59 | Admitting: Family Medicine

## 2019-05-13 DIAGNOSIS — F411 Generalized anxiety disorder: Secondary | ICD-10-CM | POA: Diagnosis not present

## 2019-05-14 ENCOUNTER — Telehealth: Payer: Self-pay | Admitting: Family Medicine

## 2019-05-14 NOTE — Telephone Encounter (Signed)
CALLED PT TO SCHD A CPE   LEFT MSG

## 2019-05-20 DIAGNOSIS — F411 Generalized anxiety disorder: Secondary | ICD-10-CM | POA: Diagnosis not present

## 2019-05-28 MED FILL — HYDROXYCHLOROQUINE 200 MG T: 200 | 90 days supply | Qty: 45 | Fill #0

## 2019-06-16 ENCOUNTER — Other Ambulatory Visit: Payer: Self-pay | Admitting: Obstetrics and Gynecology

## 2019-06-16 DIAGNOSIS — Z9189 Other specified personal risk factors, not elsewhere classified: Secondary | ICD-10-CM

## 2019-06-17 NOTE — Progress Notes (Deleted)
Office Visit Note  Patient: Sherry Jacobs             Date of Birth: [birth year]           MRN: [id number]             PCP: Ann Held, DO Referring: Ann Held, * Visit Date: 06/30/2019 Occupation: @GUAROCC @  Subjective:  No chief complaint on file.   History of Present Illness: Sherry Jacobs is a 53 y.o. female ***   Activities of Daily Living:  Patient reports morning stiffness for *** {minute/hour:19697}.   Patient {ACTIONS;DENIES/REPORTS:21021675::"Denies"} nocturnal pain.  Difficulty dressing/grooming: {ACTIONS;DENIES/REPORTS:21021675::"Denies"} Difficulty climbing stairs: {ACTIONS;DENIES/REPORTS:21021675::"Denies"} Difficulty getting out of chair: {ACTIONS;DENIES/REPORTS:21021675::"Denies"} Difficulty using hands for taps, buttons, cutlery, and/or writing: {ACTIONS;DENIES/REPORTS:21021675::"Denies"}  No Rheumatology ROS completed.   PMFS History:  Patient Active Problem List   Diagnosis Date Noted  . Preventative health care 01/02/2018  . URI (upper respiratory infection) 02/19/2017  . Neck strain 02/19/2017  . High risk medication use 01/02/2017  . Right ankle pain 08/05/2013  . Cellulitis, toe 03/21/2012  . UNSPECIFIED VITAMIN D DEFICIENCY 02/02/2009  . 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 LEG PAIN, LEFT 10/28/2008  . ANXIETY 06/23/2008  . UNSPECIFIED OSTEOPOROSIS 12/29/2007  . HYPERLIPIDEMIA 10/02/2007  . INJURY, 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 09/09/2007  . Palpitations 09/09/2007  . ANA POSITIVE, HX OF 09/09/2007  . IRRITABLE BOWEL SYNDROME 04/11/2007  . FIBROMYALGIA 04/11/2007    Past Medical History:  Diagnosis Date  . ANA positive   . Arthralgia   . 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 Endometrial polyp   . IBS (irritable bowel syndrome)   . Iron deficiency anemia   . Lactose intolerance in adult   . PONV (postoperative nausea and vomiting)   . Social anxiety disorder   . Wears contact lenses     Family History  Problem  Relation Age of Onset  . Breast cancer Mother   . Colon polyps Father   . Diabetes Unknown   . Hypertension Unknown   . Alcohol abuse Unknown   . Hyperlipidemia Unknown   . Arthritis Unknown   . Stroke Unknown   . Thyroid disease Unknown   . Uterine cancer Unknown   . Breast cancer Paternal Grandmother   . Melanoma Maternal Grandfather   . Asthma Daughter        exercise induced   . Asthma Son        exercise induced    Past Surgical History:  Procedure Laterality Date  . APPENDECTOMY  1981  . DILATATION & CURETTAGE/HYSTEROSCOPY WITH MYOSURE N/A 10/28/2014   Procedure: DILATATION & CURETTAGE/HYSTEROSCOPY WITH MYOSURE;  Surgeon: Marylynn Pearson, MD;  Location: [facility];  Service: Gynecology;  Laterality: N/A;  . 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 H1N1 10/13/2008  . Influenza Whole 11/26/2004, 09/15/2008, 08/30/2010  . Influenza-Unspecified 08/27/2011, 08/26/2016  . Pneumococcal Polysaccharide-23 05/11/2011  . Td 12/15/2008     Objective: Vital Signs: LMP 04/07/2017    Physical Exam  Musculoskeletal Exam: ***  CDAI Exam: CDAI Score: - Patient Global: -; Provider Global: - Swollen: -; Tender: - Joint Exam   No joint exam has been documented for this visit   There is currently no information documented on the homunculus. Go to the Rheumatology activity and complete the homunculus joint exam.  Investigation: No additional findings.  Imaging: No results found.  Recent Labs: Lab Results  Component Value Date   WBC 5.4 11/07/2018   HGB 13.6 11/07/2018   PLT 276 11/07/2018   NA 139 11/07/2018   K 3.7  11/07/2018   CL 102 11/07/2018   CO2 29 11/07/2018   GLUCOSE 110 (H) 11/07/2018   BUN 16 11/07/2018   CREATININE 0.91 11/07/2018   BILITOT 0.3 11/07/2018   ALKPHOS 50 06/03/2017   AST 19 11/07/2018   ALT 20 11/07/2018   PROT 7.3 11/07/2018   ALBUMIN 4.6 06/03/2017   CALCIUM 9.9 11/07/2018   GFRAA 83 11/07/2018    Speciality Comments: PLQ EYe Exam: 11/05/18 WNL @ [facility].  Procedures:  No procedures performed Allergies: Sulfa antibiotics   Assessment / Plan:     Visit Diagnoses: No diagnosis found.  Orders: No orders of the defined types were placed in this encounter.  No orders of the defined types were placed in this encounter.   Face-to-face time spent with patient was *** minutes. Greater than 50% of time was spent in counseling and coordination of care.  Follow-Up Instructions: No follow-ups on file.   Earnestine Mealing, CMA  Note - This record has been created using Editor, commissioning.  Chart creation errors have been sought, but may not always  have been located. Such creation errors do not reflect on  the standard of medical care.

## 2019-06-29 ENCOUNTER — Other Ambulatory Visit: Payer: Self-pay | Admitting: Rheumatology

## 2019-06-29 NOTE — Telephone Encounter (Signed)
Last Visit: 06/10/2018 Next Visit: 08/17/2019 Labs: 11/07/2018 Glucose is 110. Rest of CMP WNL. CBC WNL. Eye exam: 11/05/2018   Attempted to contact patient and left message on machine to advise patient she is due to update labs.

## 2019-06-30 ENCOUNTER — Ambulatory Visit: Payer: 59 | Admitting: Physician Assistant

## 2019-07-01 NOTE — Telephone Encounter (Signed)
Patient states she does not need a refill at this time.

## 2019-07-22 DIAGNOSIS — D225 Melanocytic nevi of trunk: Secondary | ICD-10-CM | POA: Diagnosis not present

## 2019-07-22 DIAGNOSIS — D2221 Melanocytic nevi of right ear and external auricular canal: Secondary | ICD-10-CM | POA: Diagnosis not present

## 2019-07-22 DIAGNOSIS — Z808 Family history of malignant neoplasm of other organs or systems: Secondary | ICD-10-CM | POA: Diagnosis not present

## 2019-07-22 DIAGNOSIS — L918 Other hypertrophic disorders of the skin: Secondary | ICD-10-CM | POA: Diagnosis not present

## 2019-07-22 DIAGNOSIS — L309 Dermatitis, unspecified: Secondary | ICD-10-CM | POA: Diagnosis not present

## 2019-07-22 DIAGNOSIS — Z86018 Personal history of other benign neoplasm: Secondary | ICD-10-CM | POA: Diagnosis not present

## 2019-07-22 DIAGNOSIS — L821 Other seborrheic keratosis: Secondary | ICD-10-CM | POA: Diagnosis not present

## 2019-07-22 DIAGNOSIS — D2230 Melanocytic nevi of unspecified part of face: Secondary | ICD-10-CM | POA: Diagnosis not present

## 2019-07-22 DIAGNOSIS — Z411 Encounter for cosmetic surgery: Secondary | ICD-10-CM | POA: Diagnosis not present

## 2019-07-22 MED FILL — FLUTICASONE PROP 0.005% OIN: 0.005 | 14 days supply | Qty: 45 | Fill #0

## 2019-07-28 IMAGING — CT CT ABD-PELV W/ CM
2 of 5 series · 17 of 46 positions shown, 19 images · IV contrast (APPLIED)
Comparison: 06/03/2017

CLINICAL DATA: Right lower quadrant pain for 4 days. Nausea.
Previous appendectomy.

EXAM:
CT ABDOMEN AND PELVIS WITH CONTRAST
TECHNIQUE: Multidetector CT imaging of the abdomen and pelvis was performed
using the standard protocol following bolus administration of
intravenous contrast.
CONTRAST:  100mL [id number] IOPAMIDOL ([id number]-3KK) INJECTION 61%

[Series 2: axial st · axial · 0.82mm/px · z∈[+439,+849]mm · 14 of 92 slices shown, 16 images]
[im 5/92  soft-tissue]
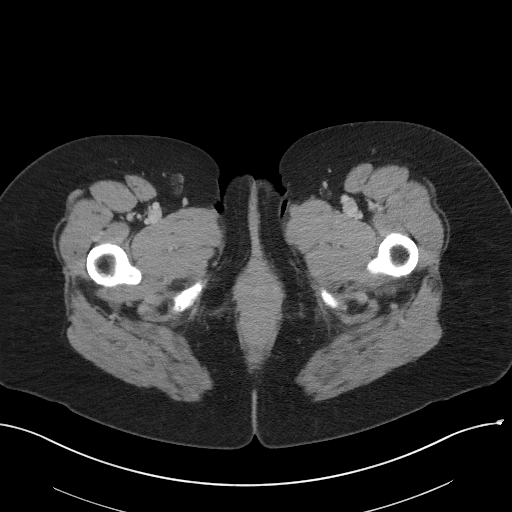
[im 5/92  bone]
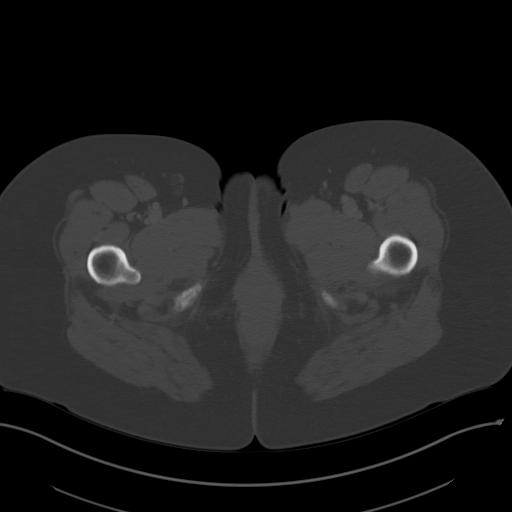
[im 10/92  soft-tissue]
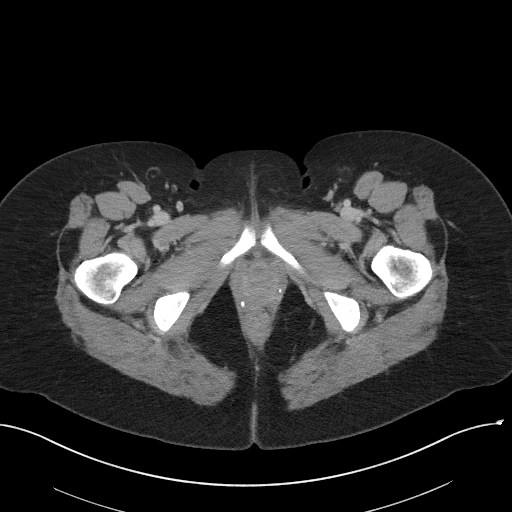
[im 20/92  soft-tissue]
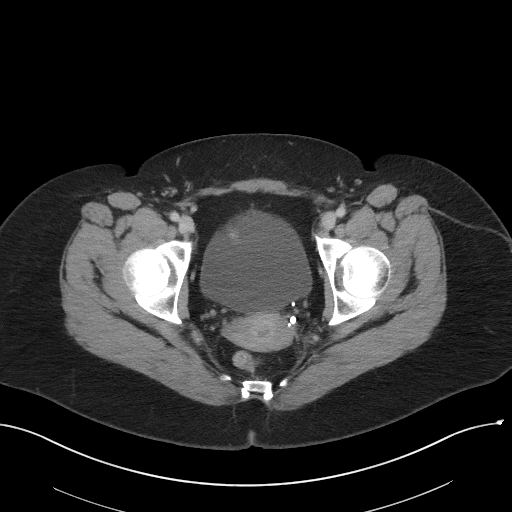
[im 24/92  soft-tissue]
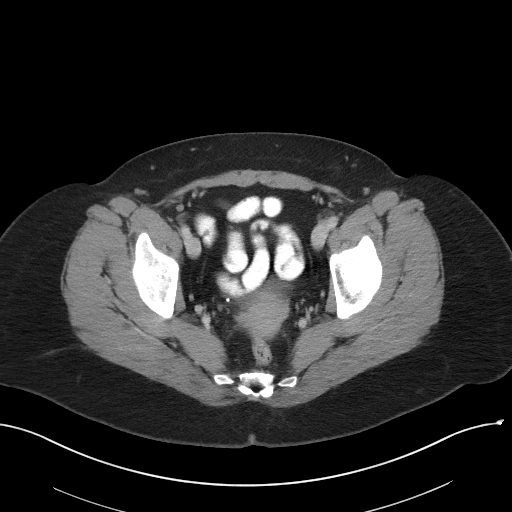
[im 29/92  soft-tissue]
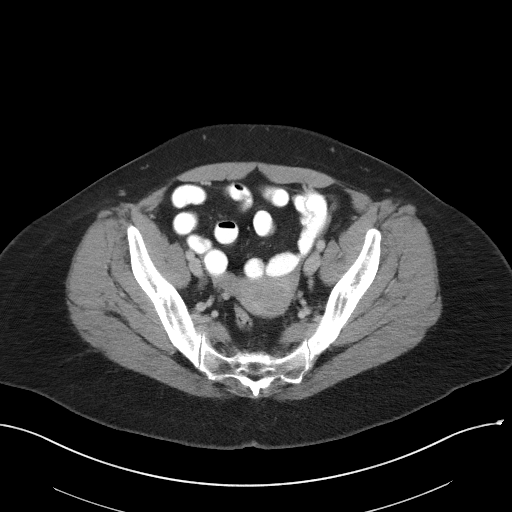
[im 39/92  soft-tissue]
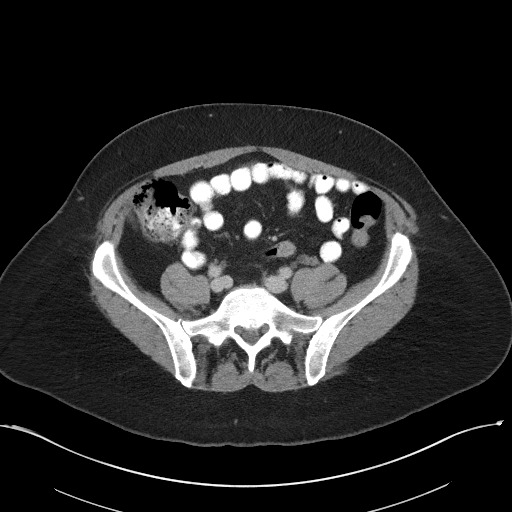
[im 44/92  soft-tissue]
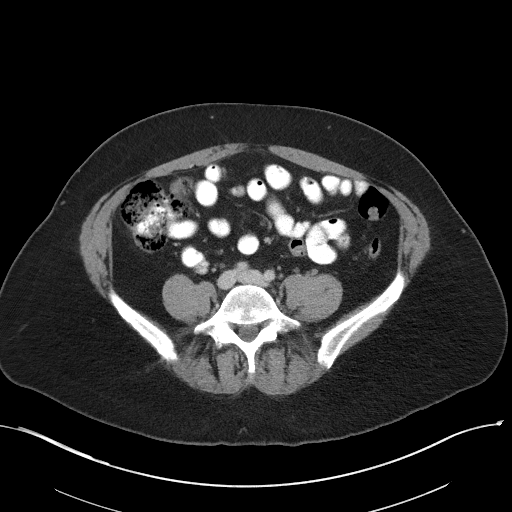
[im 48/92  soft-tissue]
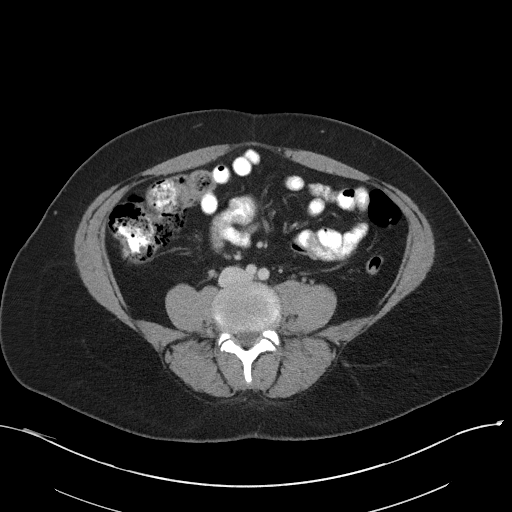
[im 53/92  soft-tissue]
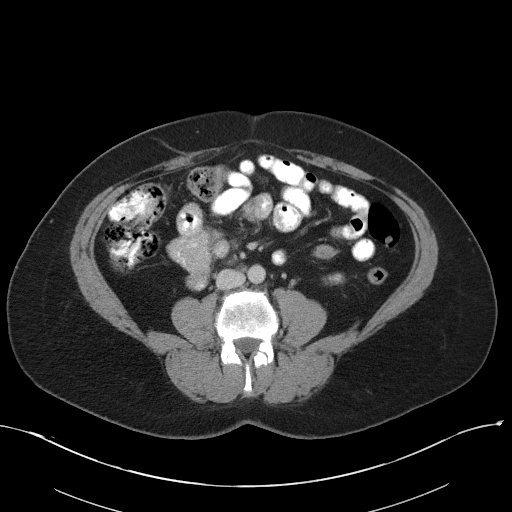
[im 53/92  bone]
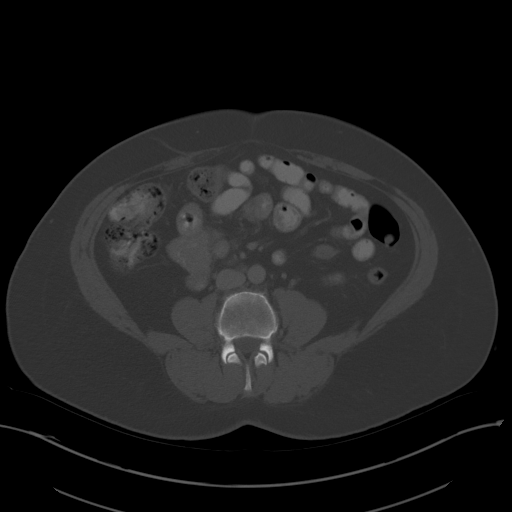
[im 63/92  soft-tissue]
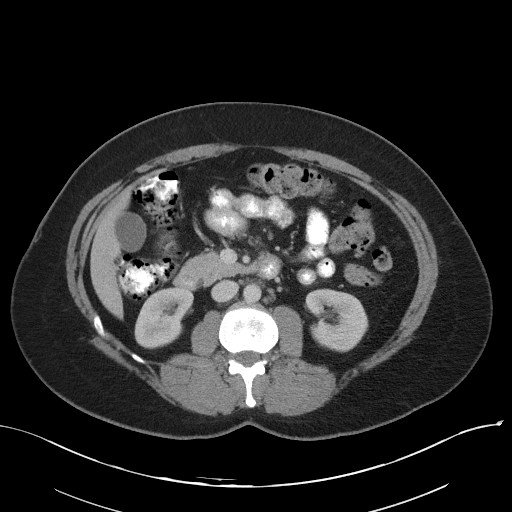
[im 68/92  soft-tissue]
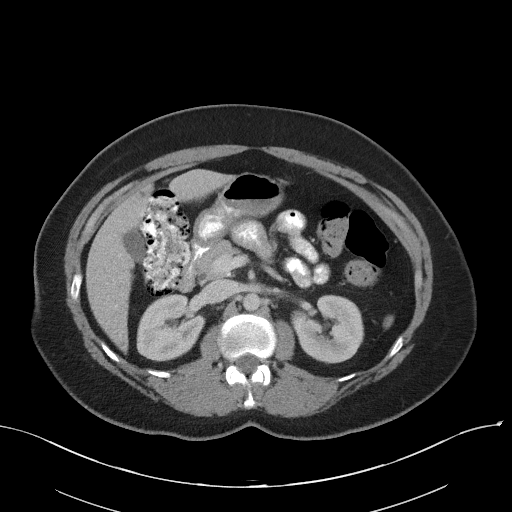
[im 72/92  soft-tissue]
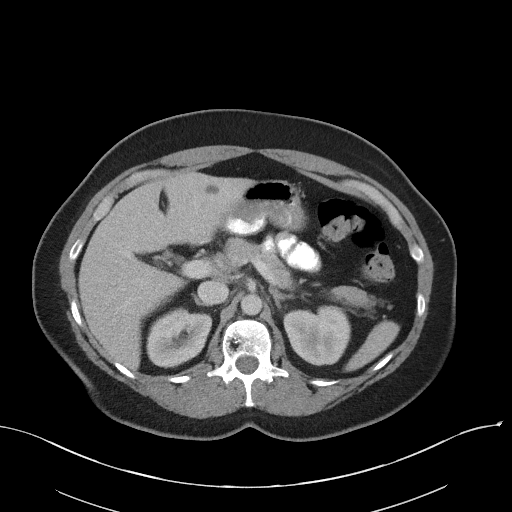
[im 82/92  soft-tissue]
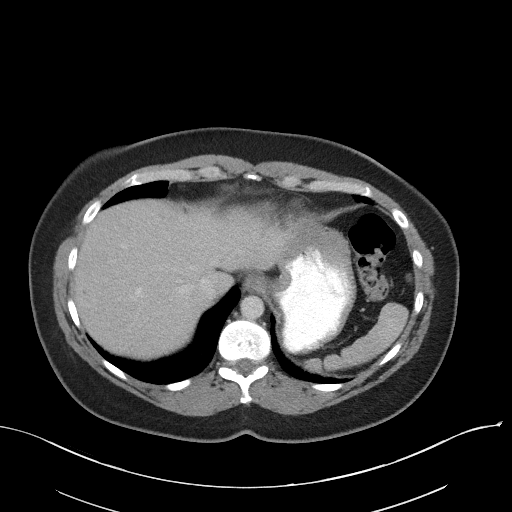
[im 87/92  soft-tissue]
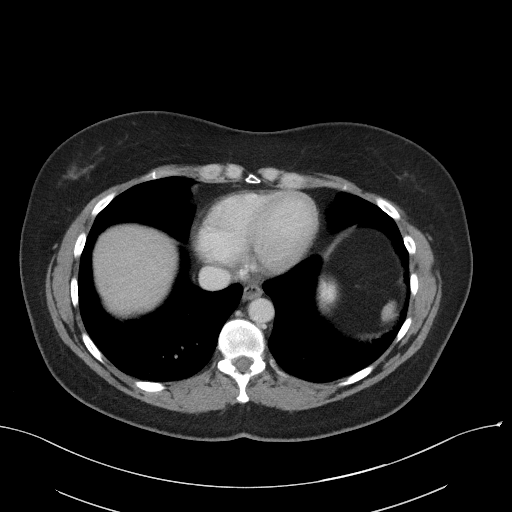

[Series 5: coronal st · coronal · 0.88mm/px · 3 of 86 slices shown]
[im 29/86  soft-tissue]
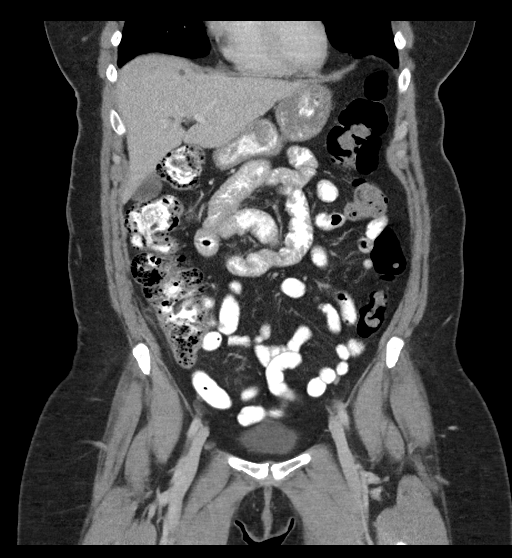
[im 38/86  soft-tissue]
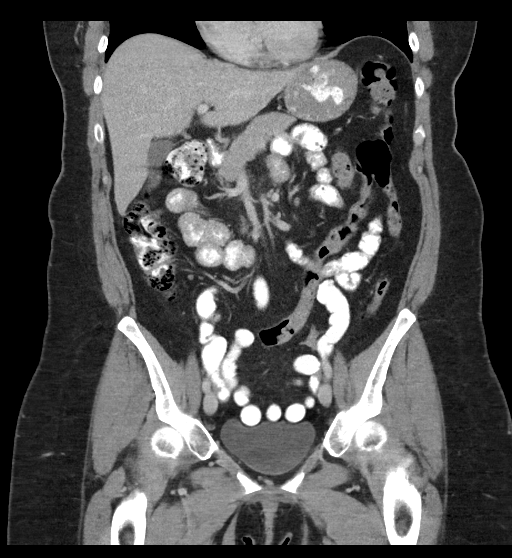
[im 48/86  soft-tissue]
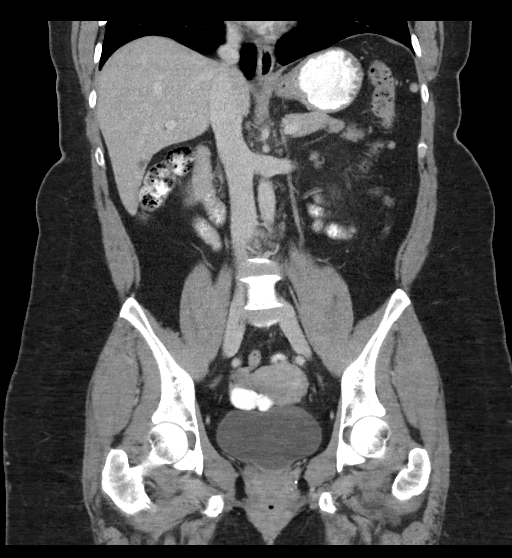

[17 of 46 positions shown; findings below may reference images not displayed]

FINDINGS: Lower Chest: No acute findings.

Hepatobiliary: No hepatic masses identified. Multiple small hepatic
cysts remain stable. Gallbladder is unremarkable.

Pancreas:  No mass or inflammatory changes.

Spleen: Within normal limits in size and appearance.

Adrenals/Urinary Tract: No masses identified. One or 2 punctate 1-2
mm calculi are seen in the lower poles of both kidneys. No evidence
of ureteral calculi or hydronephrosis. Unremarkable unopacified
urinary bladder.

Stomach/Bowel: No evidence of obstruction, inflammatory process or
abnormal fluid collections.

Vascular/Lymphatic: No pathologically enlarged lymph nodes. No
abdominal aortic aneurysm.

Reproductive:  No mass or other significant abnormality.

Other:  None.

Musculoskeletal:  No suspicious bone lesions identified.
IMPRESSION: No acute findings.

Tiny nonobstructive bilateral renal calculi.

## 2019-07-31 MED FILL — FLUTICASONE PROP 0.005% OIN: 0.005 | 14 days supply | Qty: 30 | Fill #0

## 2019-08-04 NOTE — Progress Notes (Deleted)
Office Visit Note  Patient: Sherry Jacobs             Date of Birth: 27-Jul-1966           MRN: [id number]             PCP: Ann Held, DO Referring: Ann Held, * Visit Date: 08/17/2019 Occupation: @GUAROCC @  Subjective:  No chief complaint on file.  Plaquenil 200 mg 1 tablet every other day. Last Plaquenil eye exam normal on 11/05/2018. Most recent CBC/CMP within normal limits on 11/07/2018.  Due for CBC/CMP today and will monitor every 5 months.  History of Present Illness: Sherry Jacobs is a 53 y.o. female ***   Activities of Daily Living:  Patient reports morning stiffness for *** {minute/hour:19697}.   Patient {ACTIONS;DENIES/REPORTS:21021675::"Denies"} nocturnal pain.  Difficulty dressing/grooming: {ACTIONS;DENIES/REPORTS:21021675::"Denies"} Difficulty climbing stairs: {ACTIONS;DENIES/REPORTS:21021675::"Denies"} Difficulty getting out of chair: {ACTIONS;DENIES/REPORTS:21021675::"Denies"} Difficulty using hands for taps, buttons, cutlery, and/or writing: {ACTIONS;DENIES/REPORTS:21021675::"Denies"}  No Rheumatology ROS completed.   PMFS History:  Patient Active Problem List   Diagnosis Date Noted  . Preventative health care 01/02/2018  . URI (upper respiratory infection) 02/19/2017  . Neck strain 02/19/2017  . High risk medication use 01/02/2017  . Right ankle pain 08/05/2013  . Cellulitis, toe 03/21/2012  . UNSPECIFIED VITAMIN D DEFICIENCY 02/02/2009  . 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 LEG PAIN, LEFT 10/28/2008  . ANXIETY 06/23/2008  . UNSPECIFIED OSTEOPOROSIS 12/29/2007  . HYPERLIPIDEMIA 10/02/2007  . INJURY, 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 09/09/2007  . Palpitations 09/09/2007  . ANA POSITIVE, HX OF 09/09/2007  . IRRITABLE BOWEL SYNDROME 04/11/2007  . FIBROMYALGIA 04/11/2007    Past Medical History:  Diagnosis Date  . ANA positive   . Arthralgia   . Autoimmune disease ([place])    ANA positive  . Endometrial polyp   . IBS (irritable bowel  syndrome)   . Iron deficiency anemia   . Lactose intolerance in adult   . PONV (postoperative nausea and vomiting)   . Social anxiety disorder   . Wears contact lenses     Family History  Problem Relation Age of Onset  . Breast cancer Mother   . Colon polyps Father   . Diabetes Unknown   . Hypertension Unknown   . Alcohol abuse Unknown   . Hyperlipidemia Unknown   . Arthritis Unknown   . Stroke Unknown   . Thyroid disease Unknown   . Uterine cancer Unknown   . Breast cancer Paternal Grandmother   . Melanoma Maternal Grandfather   . Asthma Daughter        exercise induced   . Asthma Son        exercise induced    Past Surgical History:  Procedure Laterality Date  . APPENDECTOMY  1981  . DILATATION & CURETTAGE/HYSTEROSCOPY WITH MYOSURE N/A 10/28/2014   Procedure: DILATATION & CURETTAGE/HYSTEROSCOPY WITH MYOSURE;  Surgeon: Marylynn Pearson, MD;  Location: [facility];  Service: Gynecology;  Laterality: N/A;  . 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 H1N1 10/13/2008  .  Influenza Whole 11/26/2004, 09/15/2008, 08/30/2010  . Influenza-Unspecified 08/27/2011, 08/26/2016  . Pneumococcal Polysaccharide-23 05/11/2011  . Td 12/15/2008     Objective: Vital Signs: LMP 04/07/2017    Physical Exam   Musculoskeletal Exam: ***  CDAI Exam: CDAI Score: - Patient Global: -; Provider Global: - Swollen: -; Tender: - Joint Exam   No joint exam has been documented for this visit   There is currently no information documented on the homunculus. Go to the Rheumatology activity and complete the homunculus joint exam.  Investigation: No  additional findings.  Imaging: No results found.  Recent Labs: Lab Results  Component Value Date   WBC 5.4 11/07/2018   HGB 13.6 11/07/2018   PLT 276 11/07/2018   NA 139 11/07/2018   K 3.7 11/07/2018   CL 102 11/07/2018   CO2 29 11/07/2018   GLUCOSE 110 (H) 11/07/2018   BUN 16 11/07/2018   CREATININE 0.91 11/07/2018   BILITOT 0.3 11/07/2018   ALKPHOS 50 06/03/2017   AST 19 11/07/2018   ALT 20 11/07/2018   PROT 7.3 11/07/2018   ALBUMIN 4.6 06/03/2017   CALCIUM 9.9 11/07/2018   GFRAA 83 11/07/2018    Speciality Comments: PLQ EYe Exam: 11/05/18 WNL @ [facility].  Procedures:  No procedures performed Allergies: Sulfa antibiotics   Assessment / Plan:     Visit Diagnoses: No diagnosis found.  Orders: No orders of the defined types were placed in this encounter.  No orders of the defined types were placed in this encounter.   Face-to-face time spent with patient was *** minutes. Greater than 50% of time was spent in counseling and coordination of care.  Follow-Up Instructions: No follow-ups on file.   Earnestine Mealing, CMA  Note - This record has been created using Editor, commissioning.  Chart creation errors have been sought, but may not always  have been located. Such creation errors do not reflect on  the standard of medical care.

## 2019-08-17 ENCOUNTER — Ambulatory Visit: Payer: 59 | Admitting: Rheumatology

## 2019-08-18 MED FILL — FLUTICASONE PROP 0.005% OIN: 0.005 | 15 days supply | Qty: 30 | Fill #0

## 2019-11-06 ENCOUNTER — Other Ambulatory Visit: Payer: Self-pay

## 2019-11-06 DIAGNOSIS — M359 Systemic involvement of connective tissue, unspecified: Secondary | ICD-10-CM

## 2019-11-06 DIAGNOSIS — Z79899 Other long term (current) drug therapy: Secondary | ICD-10-CM

## 2019-11-09 ENCOUNTER — Encounter: Payer: Self-pay | Admitting: Rheumatology

## 2019-11-09 ENCOUNTER — Telehealth (INDEPENDENT_AMBULATORY_CARE_PROVIDER_SITE_OTHER): Payer: 59 | Admitting: Rheumatology

## 2019-11-09 ENCOUNTER — Other Ambulatory Visit: Payer: Self-pay

## 2019-11-09 DIAGNOSIS — M359 Systemic involvement of connective tissue, unspecified: Secondary | ICD-10-CM

## 2019-11-09 DIAGNOSIS — M7062 Trochanteric bursitis, left hip: Secondary | ICD-10-CM | POA: Diagnosis not present

## 2019-11-09 DIAGNOSIS — R5383 Other fatigue: Secondary | ICD-10-CM

## 2019-11-09 DIAGNOSIS — M797 Fibromyalgia: Secondary | ICD-10-CM | POA: Diagnosis not present

## 2019-11-09 DIAGNOSIS — M3500 Sicca syndrome, unspecified: Secondary | ICD-10-CM

## 2019-11-09 DIAGNOSIS — Z79899 Other long term (current) drug therapy: Secondary | ICD-10-CM

## 2019-11-09 DIAGNOSIS — Z8639 Personal history of other endocrine, nutritional and metabolic disease: Secondary | ICD-10-CM | POA: Diagnosis not present

## 2019-11-09 DIAGNOSIS — I7300 Raynaud's syndrome without gangrene: Secondary | ICD-10-CM | POA: Diagnosis not present

## 2019-11-09 DIAGNOSIS — Z8719 Personal history of other diseases of the digestive system: Secondary | ICD-10-CM | POA: Diagnosis not present

## 2019-11-09 DIAGNOSIS — M7061 Trochanteric bursitis, right hip: Secondary | ICD-10-CM

## 2019-11-09 DIAGNOSIS — Z8659 Personal history of other mental and behavioral disorders: Secondary | ICD-10-CM

## 2019-11-09 NOTE — Progress Notes (Signed)
Virtual Visit via Telephone Note  I connected with Sherry Jacobs on 11/09/19 at  9:30 AM EST by telephone and verified that I am speaking with the correct person using two identifiers.  Location: Patient: Home  Provider: Clinic  This service was conducted via virtual visit.   The patient was located at home. I was located in my office.  Consent was obtained prior to the virtual visit and is aware of possible charges through their insurance for this visit.  The patient is an established patient.  Dr. Estanislado Pandy, MD conducted the virtual visit and [name], PA-C acted as scribe during the service.  Office staff helped with scheduling follow up visits after the service was conducted.   I discussed the limitations, risks, security and privacy concerns of performing an evaluation and management service by telephone and the availability of in person appointments. I also discussed with the patient that there may be a patient responsible charge related to this service. The patient expressed understanding and agreed to proceed.  CC: Medication monitoring  History of Present Illness: Patient is a 53 year old female with past medical history of autoimmune disease, raynaud's, and fibromyalgia. She is taking plaquenil 200 mg 1 tablet by mouth every other day.  She has not had any signs or symptoms of a flare.  She has chronic fatigue which has been stable.  She has no joint pain or joint swelling at this time.  She has not had any recent rashes, oral or nasal ulcers, sicca symptoms, or raynaud's.   Review of Systems  Constitutional: Positive for malaise/fatigue. Negative for fever.  Eyes: Negative for photophobia, pain, discharge and redness.  Respiratory: Negative for cough, shortness of breath and wheezing.   Cardiovascular: Negative for chest pain and palpitations.  Gastrointestinal: Negative for blood in stool, constipation and diarrhea.  Genitourinary: Negative for dysuria.  Musculoskeletal:  Negative for back pain, joint pain, myalgias and neck pain.  Skin: Negative for rash.  Neurological: Negative for dizziness and headaches.  Psychiatric/Behavioral: Negative for depression. The patient is not nervous/anxious and does not have insomnia.       Observations/Objective: Physical Exam  Constitutional: She is oriented to person, place, and time.  Neurological: She is alert and oriented to person, place, and time.  Psychiatric: Mood, memory, affect and judgment normal.   Patient reports morning stiffness for 0 minutes.   Patient denies nocturnal pain.  Difficulty dressing/grooming: Denies Difficulty climbing stairs: Denies Difficulty getting out of chair: Denies Difficulty using hands for taps, buttons, cutlery, and/or writing: Denies   Assessment and Plan: Visit Diagnoses: Autoimmune disease ([place]) -  +ANA, low C3, arthralgia, fatigue, Sicca symptoms, and Raynaud's: She has not had any signs or symptoms of a flare recently.  She is clinically doing well on Plaquenil 200 mg 1 tablet by mouth every other day. She is not having any joint pain, joint swelling, or morning stiffness.  She has not had any recent rashes or photosensitivity.  She has not had oral or nasal ulcerations.  She is not experiencing sicca symptoms.  She has not had any symptoms of raynaud's recently.  She has chronic fatigue but no recent fevers or enlarged lymph nodes.  She denies any shortness of breath, chest pain, or palpations.  She will continue taking plaquenil as prescribed.  She will follow up in 6 months.   High risk medication use - Plaquenil 200 mg 1 tablet by mouth every other day. PLQ eye Exam: 11/05/18 WNL @ [facility].  She is due to update PLQ eye exam.  Orders for CBC and CMP were placed today.    Raynaud's disease without gangrene: She has not had any active symptoms of Raynaud's.  No digital ulcerations or signs of gangrene.   Sicca syndrome ([place]): She has not had any recent sicca  symptoms.   Trochanteric bursitis, right hip: Resolved.    Trochanteric bursitis, left hip: Left.  Resolved   Fibromyalgia: Her fibromyalgia pain has been manageable.  She has chronic fatigue which has been stable.  Other fatigue: Chronic but stable.   Other medical conditions are listed as follows:  History of hyperlipidemia  History of IBS  History of anxiety    Follow Up Instructions: She will follow up in 6 months.    I discussed the assessment and treatment plan with the patient. The patient was provided an opportunity to ask questions and all were answered. The patient agreed with the plan and demonstrated an understanding of the instructions.   The patient was advised to call back or seek an in-person evaluation if the symptoms worsen or if the condition fails to improve as anticipated.  I provided 15 minutes of non-face-to-face time during this encounter.   Bo Merino, MD    Scribed by-   [name], PA-C

## 2019-11-13 ENCOUNTER — Other Ambulatory Visit
Admission: RE | Admit: 2019-11-13 | Discharge: 2019-11-13 | Disposition: A | Discharge status: Home / Self Care | Payer: 59 | Source: Ambulatory Visit | Attending: Rheumatology | Admitting: Rheumatology

## 2019-11-13 DIAGNOSIS — M359 Systemic involvement of connective tissue, unspecified: Secondary | ICD-10-CM | POA: Insufficient documentation

## 2019-11-13 DIAGNOSIS — Z79899 Other long term (current) drug therapy: Secondary | ICD-10-CM | POA: Diagnosis not present

## 2019-11-13 DIAGNOSIS — I7300 Raynaud's syndrome without gangrene: Secondary | ICD-10-CM | POA: Insufficient documentation

## 2019-11-13 LAB — CBC
HCT: 39.7 % (ref 36.0–46.0)
Hemoglobin: 13.1 g/dL (ref 12.0–15.0)
MCH: 28 pg (ref 26.0–34.0)
MCHC: 33 g/dL (ref 30.0–36.0)
MCV: 84.8 fL (ref 80.0–100.0)
Platelets: 270 10*3/uL (ref 150–400)
RBC: 4.68 MIL/uL (ref 3.87–5.11)
RDW: 13.2 % (ref 11.5–15.5)
WBC: 5.4 10*3/uL (ref 4.0–10.5)
nRBC: 0 % (ref 0.0–0.2)

## 2019-11-13 LAB — URINALYSIS, ROUTINE W REFLEX MICROSCOPIC
Bilirubin Urine: NEGATIVE
Glucose, UA: NEGATIVE mg/dL
Hgb urine dipstick: NEGATIVE
Ketones, ur: NEGATIVE mg/dL
Leukocytes,Ua: NEGATIVE
Nitrite: NEGATIVE
Protein, ur: NEGATIVE mg/dL
Specific Gravity, Urine: 1.016 (ref 1.005–1.030)
pH: 6 (ref 5.0–8.0)

## 2019-11-13 LAB — VITAMIN D 25 HYDROXY (VIT D DEFICIENCY, FRACTURES): Vit D, 25-Hydroxy: 26.44 ng/mL — ABNORMAL LOW (ref 30–100)

## 2019-11-13 LAB — COMPREHENSIVE METABOLIC PANEL
ALT: 21 U/L (ref 0–44)
AST: 19 U/L (ref 15–41)
Albumin: 4.3 g/dL (ref 3.5–5.0)
Alkaline Phosphatase: 74 U/L (ref 38–126)
Anion gap: 10 (ref 5–15)
BUN: 19 mg/dL (ref 6–20)
CO2: 25 mmol/L (ref 22–32)
Calcium: 9.5 mg/dL (ref 8.9–10.3)
Chloride: 103 mmol/L (ref 98–111)
Creatinine, Ser: 0.9 mg/dL (ref 0.44–1.00)
GFR calc Af Amer: >60 mL/min (ref >60)
GFR calc non Af Amer: >60 mL/min (ref >60)
Glucose, Bld: 80 mg/dL (ref 70–99)
Potassium: 3.9 mmol/L (ref 3.5–5.1)
Sodium: 138 mmol/L (ref 135–145)
Total Bilirubin: 0.5 mg/dL (ref 0.3–1.2)
Total Protein: 7.3 g/dL (ref 6.5–8.1)

## 2019-11-13 LAB — SEDIMENTATION RATE: Sed Rate: 10 mm/hr (ref 0–30)

## 2019-11-14 LAB — ANA: Anti Nuclear Antibody (ANA): NEGATIVE

## 2019-11-14 LAB — C4 COMPLEMENT: Complement C4, Body Fluid: 29 mg/dL (ref 12–38)

## 2019-11-14 LAB — ANTI-DNA ANTIBODY, DOUBLE-STRANDED: ds DNA Ab: <1 IU/mL (ref 0–9)

## 2019-11-14 LAB — C3 COMPLEMENT: C3 Complement: 141 mg/dL (ref 82–167)

## 2019-11-17 ENCOUNTER — Other Ambulatory Visit: Payer: Self-pay

## 2019-11-17 ENCOUNTER — Telehealth: Payer: Self-pay | Admitting: Rheumatology

## 2019-11-17 DIAGNOSIS — E559 Vitamin D deficiency, unspecified: Secondary | ICD-10-CM

## 2019-11-17 DIAGNOSIS — M359 Systemic involvement of connective tissue, unspecified: Secondary | ICD-10-CM

## 2019-11-17 MED ORDER — VITAMIN D (ERGOCALCIFEROL) 1.25 MG (50000 UNIT) PO CAPS: 50000.0000 [IU] | ORAL_CAPSULE | ORAL | 0 refills | Status: DC

## 2019-11-17 MED ORDER — HYDROXYCHLOROQUINE SULFATE 200 MG PO TABS: ORAL_TABLET | ORAL | 0 refills | Status: DC

## 2019-11-17 NOTE — Progress Notes (Signed)
Called to advise patient of lab results and patient requested a refill of PLQ.   Last Visit: 11/09/2019 telemedicine  Next Visit: message sent to the front desk to schedule.  Labs: 11/13/2019 CBC and CMP WNL.  Eye exam:11/05/2018   Okay to refill per Dr. Estanislado Pandy.

## 2019-11-17 NOTE — Telephone Encounter (Signed)
-----   Message from Shona Needles, RT sent at 11/09/2019  2:28 PM EST ----- Regarding: 6 MONTH F/U

## 2019-11-17 NOTE — Telephone Encounter (Signed)
LMOM for patient to call and schedule 6 month follow-up appointment ?

## 2019-12-07 ENCOUNTER — Encounter: Payer: Self-pay | Admitting: Family Medicine

## 2019-12-07 DIAGNOSIS — Z803 Family history of malignant neoplasm of breast: Secondary | ICD-10-CM | POA: Diagnosis not present

## 2019-12-07 DIAGNOSIS — Z1231 Encounter for screening mammogram for malignant neoplasm of breast: Secondary | ICD-10-CM | POA: Diagnosis not present

## 2020-01-28 MED FILL — PROPRANOLOL 20 MG TABLET: 20 | 90 days supply | Qty: 270 | Fill #1

## 2020-05-23 ENCOUNTER — Other Ambulatory Visit: Payer: Self-pay

## 2020-05-23 ENCOUNTER — Encounter: Payer: Self-pay | Admitting: Family Medicine

## 2020-05-23 ENCOUNTER — Ambulatory Visit (INDEPENDENT_AMBULATORY_CARE_PROVIDER_SITE_OTHER): Payer: 59 | Admitting: Family Medicine

## 2020-05-23 VITALS — BP 110/80 | HR 79 | Temp 97.4°F | Resp 18 | Ht 67.0 in | Wt 203.4 lb

## 2020-05-23 DIAGNOSIS — Z0000 Encounter for general adult medical examination without abnormal findings: Secondary | ICD-10-CM | POA: Diagnosis not present

## 2020-05-23 NOTE — Progress Notes (Signed)
Subjective:     Sherry Jacobs is a 54 y.o. female and is here for a comprehensive physical exam. The patient reports no problems.  Social History   Socioeconomic History  . Marital status: Married    Spouse name: Not on file  . Number of children: 2  . Years of education: Not on file  . Highest education level: Not on file  Occupational History  . Occupation: Optician, dispensing: [employer]  Tobacco Use  . Smoking status: Never Smoker  . Smokeless tobacco: Never Used  Vaping Use  . Vaping Use: Never used  Substance and Sexual Activity  . Alcohol use: Yes    Comment: social   . Drug use: Never  . Sexual activity: Not on file  Other Topics Concern  . 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 Difficulty of Paying Living Expenses:   Food Insecurity:   . Worried About Charity fundraiser in the Last Year:   . Arboriculturist in the Last Year:   Transportation Needs:   . Film/video editor (Medical):   Marland Kitchen Lack of Transportation (Non-Medical):   Physical Activity:   . Days of Exercise per Week:   . Minutes of Exercise per Session:   Stress:   . Feeling of Stress :   Social Connections:   . Frequency of Communication with Friends and Family:   . Frequency of Social Gatherings with Friends and Family:   . Attends Religious Services:   . Active Member of Clubs or Organizations:   . Attends Archivist Meetings:   Marland Kitchen Marital Status:   Intimate Partner Violence:   . Fear of Current or Ex-Partner:   . Emotionally Abused:   Marland Kitchen Physically Abused:   . Sexually Abused:    Health Maintenance  Topic Date Due  . Hepatitis C Screening  Never done  . COVID-19 Vaccine (1) Never done  . COLONOSCOPY  Never done  . PAP SMEAR-Modifier  02/06/2018  . TETANUS/TDAP  12/15/2018  . INFLUENZA VACCINE   06/26/2020  . MAMMOGRAM  12/06/2020  . HIV Screening  Completed    The following portions of the patient's history were reviewed and updated as appropriate:  She  has a past medical history of ANA positive, Arthralgia, Autoimmune disease ([place]), Endometrial polyp, IBS (irritable bowel syndrome), Iron deficiency anemia, Lactose intolerance in adult, PONV (postoperative nausea and vomiting), Social anxiety disorder, and Wears contact lenses. She does not have any pertinent problems on file. She  has a past surgical history that includes Appendectomy (1981); RIGHT MODIFIED NECK DISSECTION W/ REMOVAL BENIGN RIGHT PARAPHARYNGEAL SPACE MASS AND LYMPH NODES (08-02-2004); and Dilatation & curettage/hysteroscopy with myosure (N/A, 10/28/2014). Her family history includes Alcohol abuse in her unknown relative; Arthritis in her unknown relative; Asthma in her daughter and son; Breast cancer in her mother and paternal grandmother; Colon polyps in her father; Diabetes in her unknown relative; Hyperlipidemia in her unknown relative; Hypertension in her unknown relative; Melanoma in her maternal grandfather; Stroke in her unknown relative; Thyroid disease in her unknown relative; Uterine cancer in her unknown relative. She  reports that she has never smoked. She  has never used smokeless tobacco. She reports current alcohol use. She reports that she does not use drugs. She has a current medication list which includes the following prescription(s): alprazolam, hydroxychloroquine, multivitamin, and propranolol. Current Outpatient Medications on File Prior to Visit  Medication Sig Dispense Refill  . ALPRAZolam (XANAX) 0.25 MG tablet Take 0.25 mg by mouth at bedtime as needed for anxiety.    . hydroxychloroquine (PLAQUENIL) 200 MG tablet Take 1 tablet (200 mg) by mouth every other day. 45 tablet 0  . Multiple Vitamin (MULTIVITAMIN) tablet Take 1 tablet by mouth daily.      . propranolol (INDERAL) 20 MG tablet Take 20 mg  by mouth as needed.      No current facility-administered medications on file prior to visit.   She is allergic to sulfa antibiotics..  Review of Systems Review of Systems  Constitutional: Negative for activity change, appetite change and fatigue.  HENT: Negative for hearing loss, congestion, tinnitus and ear discharge.  dentist q59m Eyes: Negative for visual disturbance (see optho q1y -- vision corrected to 20/20 with glasses).  Respiratory: Negative for cough, chest tightness and shortness of breath.   Cardiovascular: Negative for chest pain, palpitations and leg swelling.  Gastrointestinal: Negative for abdominal pain, diarrhea, constipation and abdominal distention.  Genitourinary: Negative for urgency, frequency, decreased urine volume and difficulty urinating.  Musculoskeletal: Negative for back pain, arthralgias and gait problem.  Skin: Negative for color change, pallor and rash.  Neurological: Negative for dizziness, light-headedness, numbness and headaches.  Hematological: Negative for adenopathy. Does not bruise/bleed easily.  Psychiatric/Behavioral: Negative for suicidal ideas, confusion, sleep disturbance, self-injury, dysphoric mood, decreased concentration and agitation.       Objective:    BP 110/80 (BP Location: Right Arm, Patient Position: Sitting, Cuff Size: Normal)   Pulse 79   Temp (!) 97.4 F (36.3 C) (Temporal)   Resp 18   Ht 5\' 7"  (1.702 m)   Wt 203 lb 6.4 oz (92.3 kg)   LMP 04/07/2017   SpO2 97%   BMI 31.86 kg/m  General appearance: alert, cooperative, appears stated age and no distress Head: Normocephalic, without obvious abnormality, atraumatic Eyes: negative findings: lids and lashes normal, conjunctivae and sclerae normal and pupils equal, round, reactive to light and accomodation Ears: normal TM's and external ear canals both ears   Neck: no adenopathy, no carotid bruit, no JVD, supple, symmetrical, trachea midline and thyroid not enlarged,  symmetric, no tenderness/mass/nodules Back: symmetric, no curvature. ROM normal. No CVA tenderness. Lungs: clear to auscultation bilaterally Breasts: gyn Heart: regular rate and rhythm, S1, S2 normal, no murmur, click, rub or gallop Abdomen: soft, non-tender; bowel sounds normal; no masses,  no organomegaly Pelvic: deferred Extremities: extremities normal, atraumatic, no cyanosis or edema Pulses: 2+ and symmetric Skin: Skin color, texture, turgor normal. No rashes or lesions Lymph nodes: Cervical, supraclavicular, and axillary nodes normal. Neurologic: Alert and oriented X 3, normal strength and tone. Normal symmetric reflexes. Normal coordination and gait    Assessment:    Healthy female exam.      Plan:    ghm utd Check labs  See After Visit Summary for Counseling Recommendations    1. Preventative health care See above  - TSH; Future - Lipid panel; Future - CBC with Differential/Platelet; Future - Comprehensive metabolic panel; Future

## 2020-05-23 NOTE — Patient Instructions (Signed)

## 2020-06-13 DIAGNOSIS — H5203 Hypermetropia, bilateral: Secondary | ICD-10-CM | POA: Diagnosis not present

## 2020-06-13 DIAGNOSIS — H40033 Anatomical narrow angle, bilateral: Secondary | ICD-10-CM | POA: Diagnosis not present

## 2020-06-13 DIAGNOSIS — H04123 Dry eye syndrome of bilateral lacrimal glands: Secondary | ICD-10-CM | POA: Diagnosis not present

## 2020-06-13 DIAGNOSIS — H524 Presbyopia: Secondary | ICD-10-CM | POA: Diagnosis not present

## 2020-06-30 DIAGNOSIS — Z01419 Encounter for gynecological examination (general) (routine) without abnormal findings: Secondary | ICD-10-CM | POA: Diagnosis not present

## 2020-06-30 DIAGNOSIS — Z6832 Body mass index (BMI) 32.0-32.9, adult: Secondary | ICD-10-CM | POA: Diagnosis not present

## 2020-07-13 DIAGNOSIS — Z79899 Other long term (current) drug therapy: Secondary | ICD-10-CM | POA: Diagnosis not present

## 2020-07-13 DIAGNOSIS — H5203 Hypermetropia, bilateral: Secondary | ICD-10-CM | POA: Diagnosis not present

## 2020-07-13 DIAGNOSIS — H04123 Dry eye syndrome of bilateral lacrimal glands: Secondary | ICD-10-CM | POA: Diagnosis not present

## 2020-07-13 DIAGNOSIS — H40033 Anatomical narrow angle, bilateral: Secondary | ICD-10-CM | POA: Diagnosis not present

## 2020-08-02 ENCOUNTER — Other Ambulatory Visit: Payer: Self-pay | Admitting: Rheumatology

## 2020-08-02 DIAGNOSIS — M359 Systemic involvement of connective tissue, unspecified: Secondary | ICD-10-CM

## 2020-08-03 ENCOUNTER — Other Ambulatory Visit: Payer: Self-pay | Admitting: Rheumatology

## 2020-08-03 DIAGNOSIS — M359 Systemic involvement of connective tissue, unspecified: Secondary | ICD-10-CM

## 2020-08-04 ENCOUNTER — Other Ambulatory Visit
Admission: RE | Admit: 2020-08-04 | Discharge: 2020-08-04 | Disposition: A | Discharge status: Home / Self Care | Payer: 59 | Source: Ambulatory Visit | Attending: Family Medicine | Admitting: Family Medicine

## 2020-08-04 ENCOUNTER — Telehealth: Payer: Self-pay | Admitting: Rheumatology

## 2020-08-04 ENCOUNTER — Other Ambulatory Visit: Payer: Self-pay | Admitting: Rheumatology

## 2020-08-04 DIAGNOSIS — Z79899 Other long term (current) drug therapy: Secondary | ICD-10-CM | POA: Diagnosis not present

## 2020-08-04 DIAGNOSIS — M359 Systemic involvement of connective tissue, unspecified: Secondary | ICD-10-CM

## 2020-08-04 LAB — COMPREHENSIVE METABOLIC PANEL
ALT: 25 U/L (ref 0–44)
AST: 20 U/L (ref 15–41)
Albumin: 4.2 g/dL (ref 3.5–5.0)
Alkaline Phosphatase: 70 U/L (ref 38–126)
Anion gap: 9 (ref 5–15)
BUN: 22 mg/dL — ABNORMAL HIGH (ref 6–20)
CO2: 27 mmol/L (ref 22–32)
Calcium: 9.2 mg/dL (ref 8.9–10.3)
Chloride: 104 mmol/L (ref 98–111)
Creatinine, Ser: 0.87 mg/dL (ref 0.44–1.00)
GFR calc Af Amer: >60 mL/min (ref >60)
GFR calc non Af Amer: >60 mL/min (ref >60)
Glucose, Bld: 104 mg/dL — ABNORMAL HIGH (ref 70–99)
Potassium: 3.9 mmol/L (ref 3.5–5.1)
Sodium: 140 mmol/L (ref 135–145)
Total Bilirubin: 0.7 mg/dL (ref 0.3–1.2)
Total Protein: 7.4 g/dL (ref 6.5–8.1)

## 2020-08-04 LAB — CBC WITH DIFFERENTIAL/PLATELET
Abs Immature Granulocytes: 0.02 10*3/uL (ref 0.00–0.07)
Basophils Absolute: 0 10*3/uL (ref 0.0–0.1)
Basophils Relative: 1 %
Eosinophils Absolute: 0.1 10*3/uL (ref 0.0–0.5)
Eosinophils Relative: 2 %
HCT: 39.2 % (ref 36.0–46.0)
Hemoglobin: 13.1 g/dL (ref 12.0–15.0)
Immature Granulocytes: 0 %
Lymphocytes Relative: 35 %
Lymphs Abs: 2.1 10*3/uL (ref 0.7–4.0)
MCH: 28.4 pg (ref 26.0–34.0)
MCHC: 33.4 g/dL (ref 30.0–36.0)
MCV: 84.8 fL (ref 80.0–100.0)
Monocytes Absolute: 0.6 10*3/uL (ref 0.1–1.0)
Monocytes Relative: 10 %
Neutro Abs: 3.1 10*3/uL (ref 1.7–7.7)
Neutrophils Relative %: 52 %
Platelets: 257 10*3/uL (ref 150–400)
RBC: 4.62 MIL/uL (ref 3.87–5.11)
RDW: 13.4 % (ref 11.5–15.5)
WBC: 5.9 10*3/uL (ref 4.0–10.5)
nRBC: 0 % (ref 0.0–0.2)

## 2020-08-04 NOTE — Telephone Encounter (Signed)
Patient called stating she received a message through [facility] that her Hydroxychloroquine has been denied and that she needed to contact her doctor.  Patient is requesting a return call.

## 2020-08-04 NOTE — Telephone Encounter (Signed)
Patient advised she needs an appointment and labs ASAP. Patient states she will have lab work tomorrow and has been scheduled for 08/09/2020.

## 2020-08-04 NOTE — Telephone Encounter (Signed)
Lab Orders faxed

## 2020-08-04 NOTE — Telephone Encounter (Signed)
Patient requesting you fax lab order to # [phone] attn. [facility]

## 2020-08-05 NOTE — Progress Notes (Signed)
Office Visit Note  Patient: Sherry Jacobs             Date of Birth: [birth year]           MRN: [id number]             PCP: Sherry Held, DO Referring: Sherry Jacobs, * Visit Date: 08/09/2020 Occupation: '@GUAROCC' @  Subjective:  Medication monitoring   History of Present Illness: Sherry Jacobs is a 54 y.o. female with history of autoimmune disease and fibromyalgia.  She is taking plaquenil 200 mg 1 tablet by mouth every other day.  She is tolerating PLQ and has not missed any doses recently.  She denies any signs or symptoms of an autoimmune disease flare.  She denies any joint pain or joint swelling at this time.  She continues to have intermittent trochanteric bursitis and IT band syndrome bilaterally.  She has not been performing stretching exercises lately.  She denies any increased fatigue despite having intermittent sleep at night.  She has been under tremendous amount of stress at work recently.  She denies any recent rashes, photosensitivity, hair loss, symptoms of Raynaud's, oral or nasal ulcerations, sicca symptoms, enlarged lymph nodes, chest pain, shortness of breath. She has not had any recent infections.  She has received both COVID-19 vaccinations.   Activities of Daily Living:  Patient reports morning stiffness for 0 minutes.   Patient Denies nocturnal pain.  Difficulty dressing/grooming: Denies Difficulty climbing stairs: Denies Difficulty getting out of chair: Denies Difficulty using hands for taps, buttons, cutlery, and/or writing: Denies  Review of Systems  Constitutional: Negative for fatigue.  HENT: Negative for mouth sores, mouth dryness and nose dryness.   Eyes: Negative for pain, itching, visual disturbance and dryness.  Respiratory: Negative for shortness of breath and difficulty breathing.   Cardiovascular: Negative for chest pain, palpitations and swelling in legs/feet.  Gastrointestinal: Negative for constipation and diarrhea.    Endocrine: Negative for increased urination.  Genitourinary: Negative for difficulty urinating.  Musculoskeletal: Negative for arthralgias, joint pain, joint swelling, myalgias, muscle weakness, muscle tenderness and myalgias.  Skin: Negative for color change, rash and redness.  Allergic/Immunologic: Negative for susceptible to infections.  Neurological: Negative for dizziness and headaches.  Hematological: Negative for bruising/bleeding tendency.  Psychiatric/Behavioral: Positive for sleep disturbance.    PMFS History:  Patient Active Problem List   Diagnosis Date Noted  . Preventative health care 01/02/2018  . URI (upper respiratory infection) 02/19/2017  . Neck strain 02/19/2017  . High risk medication use 01/02/2017  . Right ankle pain 08/05/2013  . Cellulitis, toe 03/21/2012  . UNSPECIFIED VITAMIN D DEFICIENCY 02/02/2009  . 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 LEG PAIN, LEFT 10/28/2008  . ANXIETY 06/23/2008  . UNSPECIFIED OSTEOPOROSIS 12/29/2007  . HYPERLIPIDEMIA 10/02/2007  . INJURY, 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 09/09/2007  . Palpitations 09/09/2007  . ANA POSITIVE, HX OF 09/09/2007  . IRRITABLE BOWEL SYNDROME 04/11/2007  . FIBROMYALGIA 04/11/2007    Past Medical History:  Diagnosis Date  . ANA positive   . Arthralgia   . Autoimmune disease ([place])    ANA positive  . Endometrial polyp   . IBS (irritable bowel syndrome)   . Iron deficiency anemia   . Lactose intolerance in adult   . PONV (postoperative nausea and vomiting)   . Social anxiety disorder   . Wears contact lenses     Family History  Problem Relation Age of Onset  . Breast cancer Mother   . Colon  polyps Father   . Diabetes Unknown   . Hypertension Unknown   . Alcohol abuse Unknown   . Hyperlipidemia Unknown   . Arthritis Unknown   . Stroke Unknown   . Thyroid disease Unknown   . Uterine cancer Unknown   . Breast cancer Paternal Grandmother   . Melanoma Maternal Grandfather   . Asthma Daughter         exercise induced   . Asthma Son        exercise induced    Past Surgical History:  Procedure Laterality Date  . APPENDECTOMY  1981  . DILATATION & CURETTAGE/HYSTEROSCOPY WITH MYOSURE N/A 10/28/2014   Procedure: DILATATION & CURETTAGE/HYSTEROSCOPY WITH MYOSURE;  Surgeon: Marylynn Pearson, MD;  Location: [facility];  Service: Gynecology;  Laterality: N/A;  . 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 H1N1 10/13/2008  . Influenza Whole 11/26/2004, 09/15/2008, 08/30/2010  . Influenza-Unspecified 08/27/2011, 08/26/2016  . PFIZER SARS-COV-2 Vaccination 11/17/2019, 12/08/2019  . Pneumococcal Polysaccharide-23 05/11/2011  . Td 12/15/2008     Objective: Vital Signs: BP 105/73 (BP Location: Left Arm, Patient Position: Sitting, Cuff Size: Small)   Pulse 98   Resp 16   Ht '5\' 7"'  (1.702 m)   Wt 202 lb 6.4 oz (91.8 kg)   LMP 04/07/2017   BMI 31.70 kg/m    Physical Exam Vitals and nursing note reviewed.  Constitutional:      Appearance: She is well-developed.  HENT:     Head: Normocephalic and atraumatic.  Eyes:     Conjunctiva/sclera: Conjunctivae normal.  Pulmonary:     Effort: Pulmonary effort is normal.  Abdominal:     Palpations: Abdomen is soft.  Musculoskeletal:     Cervical back: Normal range of motion.  Skin:    General: Skin is warm and dry.     Capillary Refill: Capillary refill takes less than 2 seconds.  Neurological:     Mental Status: She is alert and oriented to person, place, and time.  Psychiatric:        Behavior: Behavior normal.      Musculoskeletal Exam: C-spine, thoracic spine, and lumbar spine good ROM.  No  midline spinal tenderness.  No SI joint tenderness.  Shoulder joints, elbow joints, wrist joints, MCPs, PIPs, and DIPs have good range of motion with no synovitis.  She has complete fist formation bilaterally.  Hip joints have good range of motion with no discomfort.  She has tenderness over bilateral trochanteric bursa.  Knee joints have good range of motion with no warmth or effusion.  Ankle joints have good range of motion with no tenderness or inflammation.  CDAI Exam: CDAI Score: -- Patient Global: --; Provider Global: -- Swollen: --; Tender: -- Joint Exam 08/09/2020   No joint exam has been documented for this visit   There is currently no information documented on the homunculus. Go to the Rheumatology activity and complete the homunculus joint exam.  Investigation: No additional findings.  Imaging: No results found.  Recent Labs: Lab Results  Component Value Date  WBC 5.9 08/04/2020   HGB 13.1 08/04/2020   PLT 257 08/04/2020   NA 140 08/04/2020   K 3.9 08/04/2020   CL 104 08/04/2020   CO2 27 08/04/2020   GLUCOSE 104 (H) 08/04/2020   BUN 22 (H) 08/04/2020   CREATININE 0.87 08/04/2020   BILITOT 0.7 08/04/2020   ALKPHOS 70 08/04/2020   AST 20 08/04/2020   ALT 25 08/04/2020   PROT 7.4 08/04/2020   ALBUMIN 4.2 08/04/2020   CALCIUM 9.2 08/04/2020   GFRAA >60 08/04/2020    Speciality Comments: PLQ EYe Exam: 07/13/2020 WNL @ [facility].  Procedures:  No procedures performed Allergies: Sulfa antibiotics   Assessment / Plan:     Visit Diagnoses: Autoimmune disease ([place]) - +ANA, low C3, arthralgia, fatigue, Sicca symptoms, and Raynaud's: She has not had any signs or symptoms of a flare recently.  She is clinically doing well on Plaquenil 200 mg 1 tablet by mouth every other day.  She is tolerating Plaquenil and has not missed any doses recently.  She is not experiencing any joint pain and has no synovitis on examination today.  She has not had any recent rashes,  photosensitivity, hair loss, symptoms of Raynaud's, oral or nasal ulcerations, sicca symptoms, enlarged lymph nodes, chest pain, or shortness of breath.  Lab work from 11/13/2019 was reviewed with the patient today in the office.  Double-stranded DNA was negative, complements within normal limits, and ESR within normal limits.  Her disease seems to be well controlled on the current dose of Plaquenil.  We will repeat autoimmune lab work today to further assess.  She will continue taking Plaquenil as prescribed.  A refill of Plaquenil sent to the pharmacy.  She was advised to notify us if she develops any new or worsening symptoms.  She will follow-up in the office in 5 months.- Plan: C3 and C4, Anti-DNA antibody, double-stranded, Sedimentation rate, Urinalysis, Routine w reflex microscopic, hydroxychloroquine (PLAQUENIL) 200 MG tablet  High risk medication use - Plaquenil 200 mg 1 tablet by mouth every other day.  PLQ Eye Exam: 07/13/2020 WNL @ [facility].  CBC and CMP were drawn on 08/04/2020 and results were reviewed with the patient today in the office.  She will continue to require lab monitoring every 5 months to monitor for drug toxicity. She has received both COVID-19 vaccinations and plans on receiving her third dose once eligible.  She was advised to avoid taking NSAIDs and Tylenol 24 hours prior to the third dose.  She was encouraged to continue to wear a mask and social distance.  She was advised to notify us or her PCP if she develops a COVID-19 infection in order to receive the antibody infusion.  She voiced understanding.  Raynaud's disease without gangrene: She has not had any symptoms of Raynaud's.  No digital ulcerations or signs of gangrene were noted.  She has good capillary refill on exam.  No skin tightness or thickening was noted.  Sicca syndrome ([place]): She has not been experiencing any sicca symptoms recently.  Vitamin D deficiency -Vitamin D was 26.44 on 11/13/2019.  She took  vitamin D 50,000 units by mouth once weekly for 3 months and then discontinued.  She has not had her vitamin D level rechecked.  We will recheck her vitamin D level today and make further recommendations about vitamin D supplementation.  Plan: VITAMIN D 25 Hydroxy (Vit-D Deficiency, Fractures)  Trochanteric bursitis, right hip: She has tenderness palpation on examination today.  She  was encouraged to perform stretching exercises on a daily basis and was given a handout of these stretches to perform.  Trochanteric bursitis, left hip: She has tenderness palpation over the left trochanteric bursa and IT band.  She was given a handout of exercises to perform.  We discussed the importance of performing the stretching exercises on a daily basis.  Fibromyalgia: She has not had any recent fibromyalgia flares.  She is not experiencing any myalgias or muscle tenderness at this time.  Her fatigue has been stable despite having interrupted sleep at night.  She attributes most of her insomnia to going through menopause as well as stress at work.  Other fatigue: Stable.  Other medical conditions are listed as follows:  History of hyperlipidemia  History of IBS  History of anxiety   Orders: Orders Placed This Encounter  Procedures  . C3 and C4  . Anti-DNA antibody, double-stranded  . Sedimentation rate  . Urinalysis, Routine w reflex microscopic  . VITAMIN D 25 Hydroxy (Vit-D Deficiency, Fractures)   Meds ordered this encounter  Medications  . hydroxychloroquine (PLAQUENIL) 200 MG tablet    Sig: Take 1 tablet (200 mg) by mouth every other day.    Dispense:  45 tablet    Refill:  0     Follow-Up Instructions: Return in about 5 months (around 01/09/2021) for Autoimmune Disease, Fibromyalgia.   Ofilia Neas, PA-C  Note - This record has been created using Dragon software.  Chart creation errors have been sought, but may not always  have been located. Such creation errors do not reflect on    the standard of medical care.

## 2020-08-09 ENCOUNTER — Ambulatory Visit (INDEPENDENT_AMBULATORY_CARE_PROVIDER_SITE_OTHER): Payer: 59 | Admitting: Physician Assistant

## 2020-08-09 ENCOUNTER — Encounter: Payer: Self-pay | Admitting: Physician Assistant

## 2020-08-09 ENCOUNTER — Other Ambulatory Visit: Payer: Self-pay

## 2020-08-09 VITALS — BP 105/73 | HR 98 | Resp 16 | Ht 67.0 in | Wt 202.4 lb

## 2020-08-09 DIAGNOSIS — Z79899 Other long term (current) drug therapy: Secondary | ICD-10-CM | POA: Diagnosis not present

## 2020-08-09 DIAGNOSIS — M359 Systemic involvement of connective tissue, unspecified: Secondary | ICD-10-CM | POA: Diagnosis not present

## 2020-08-09 DIAGNOSIS — M7061 Trochanteric bursitis, right hip: Secondary | ICD-10-CM

## 2020-08-09 DIAGNOSIS — E559 Vitamin D deficiency, unspecified: Secondary | ICD-10-CM | POA: Diagnosis not present

## 2020-08-09 DIAGNOSIS — M3500 Sicca syndrome, unspecified: Secondary | ICD-10-CM

## 2020-08-09 DIAGNOSIS — I7300 Raynaud's syndrome without gangrene: Secondary | ICD-10-CM | POA: Diagnosis not present

## 2020-08-09 DIAGNOSIS — R5383 Other fatigue: Secondary | ICD-10-CM

## 2020-08-09 DIAGNOSIS — Z8639 Personal history of other endocrine, nutritional and metabolic disease: Secondary | ICD-10-CM

## 2020-08-09 DIAGNOSIS — M7062 Trochanteric bursitis, left hip: Secondary | ICD-10-CM | POA: Diagnosis not present

## 2020-08-09 DIAGNOSIS — Z8719 Personal history of other diseases of the digestive system: Secondary | ICD-10-CM

## 2020-08-09 DIAGNOSIS — Z8659 Personal history of other mental and behavioral disorders: Secondary | ICD-10-CM

## 2020-08-09 DIAGNOSIS — M797 Fibromyalgia: Secondary | ICD-10-CM | POA: Diagnosis not present

## 2020-08-09 DIAGNOSIS — F4320 Adjustment disorder, unspecified: Secondary | ICD-10-CM | POA: Diagnosis not present

## 2020-08-09 MED ORDER — HYDROXYCHLOROQUINE SULFATE 200 MG PO TABS: ORAL_TABLET | ORAL | 0 refills | Status: DC

## 2020-08-09 NOTE — Patient Instructions (Addendum)
Hip Bursitis Rehab Ask your health care provider which exercises are safe for you. Do exercises exactly as told by your health care provider and adjust them as directed. It is normal to feel mild stretching, pulling, tightness, or discomfort as you do these exercises. Stop right away if you feel sudden pain or your pain gets worse. Do not begin these exercises until told by your health care provider. Stretching exercise This exercise warms up your muscles and joints and improves the movement and flexibility of your hip. This exercise also helps to relieve pain and stiffness. Iliotibial band stretch An iliotibial band is a strong band of muscle tissue that runs from the outer side of your hip to the outer side of your thigh and knee. 1. Lie on your side with your left / right leg in the top position. 2. Bend your left / right knee and grab your ankle. Stretch out your bottom arm to help you balance. 3. Slowly bring your knee back so your thigh is behind your body. 4. Slowly lower your knee toward the floor until you feel a gentle stretch on the outside of your left / right thigh. If you do not feel a stretch and your knee will not fall farther, place the heel of your other foot on top of your knee and pull your knee down toward the floor with your foot. 5. Hold this position for __________ seconds. 6. Slowly return to the starting position. Repeat __________ times. Complete this exercise __________ times a day. Strengthening exercises These exercises build strength and endurance in your hip and pelvis. Endurance is the ability to use your muscles for a long time, even after they get tired. Bridge This exercise strengthens the muscles that move your thigh backward (hip extensors). 1. Lie on your back on a firm surface with your knees bent and your feet flat on the floor. 2. Tighten your buttocks muscles and lift your buttocks off the floor until your trunk is level with your thighs. ? Do not arch  your back. ? You should feel the muscles working in your buttocks and the back of your thighs. If you do not feel these muscles, slide your feet 1-2 inches (2.5-5 cm) farther away from your buttocks. ? If this exercise is too easy, try doing it with your arms crossed over your chest. 3. Hold this position for __________ seconds. 4. Slowly lower your hips to the starting position. 5. Let your muscles relax completely after each repetition. Repeat __________ times. Complete this exercise __________ times a day. Squats This exercise strengthens the muscles in front of your thigh and knee (quadriceps). 1. Stand in front of a table, with your feet and knees pointing straight ahead. You may rest your hands on the table for balance but not for support. 2. Slowly bend your knees and lower your hips like you are going to sit in a chair. ? Keep your weight over your heels, not over your toes. ? Keep your lower legs upright so they are parallel with the table legs. ? Do not let your hips go lower than your knees. ? Do not bend lower than told by your health care provider. ? If your hip pain increases, do not bend as low. 3. Hold the squat position for __________ seconds. 4. Slowly push with your legs to return to standing. Do not use your hands to pull yourself to standing. Repeat __________ times. Complete this exercise __________ times a day. Hip hike 1. Stand   sideways on a bottom step. Stand on your left / right leg with your other foot unsupported next to the step. You can hold on to the railing or wall for balance if needed. 2. Keep your knees straight and your torso square. Then lift your left / right hip up toward the ceiling. 3. Hold this position for __________ seconds. 4. Slowly let your left / right hip lower toward the floor, past the starting position. Your foot should get closer to the floor. Do not lean or bend your knees. Repeat __________ times. Complete this exercise __________ times a  day. Single leg stand 1. Without shoes, stand near a railing or in a doorway. You may hold on to the railing or door frame as needed for balance. 2. Squeeze your left / right buttock muscles, then lift up your other foot. ? Do not let your left / right hip push out to the side. ? It is helpful to stand in front of a mirror for this exercise so you can watch your hip. 3. Hold this position for __________ seconds. Repeat __________ times. Complete this exercise __________ times a day. This information is not intended to replace advice given to you by your health care provider. Make sure you discuss any questions you have with your health care provider. Document Revised: 03/09/2019 Document Reviewed: 03/09/2019 Elsevier Patient Education  2020 [employer]  Iliotibial Band Syndrome Rehab Ask your health care provider which exercises are safe for you. Do exercises exactly as told by your health care provider and adjust them as directed. It is normal to feel mild stretching, pulling, tightness, or discomfort as you do these exercises. Stop right away if you feel sudden pain or your pain gets significantly worse. Do not begin these exercises until told by your health care provider. Stretching and range-of-motion exercises These exercises warm up your muscles and joints and improve the movement and flexibility of your hip and pelvis. Quadriceps stretch, prone  1. Lie on your abdomen on a firm surface, such as a bed or padded floor (prone position). 2. Bend your left / right knee and reach back to hold your ankle or pant leg. If you cannot reach your ankle or pant leg, loop a belt around your foot and grab the belt instead. 3. Gently pull your heel toward your buttocks. Your knee should not slide out to the side. You should feel a stretch in the front of your thigh and knee (quadriceps). 4. Hold this position for __________ seconds. Repeat __________ times. Complete this exercise __________ times a  day. Iliotibial band stretch An iliotibial band is a strong band of muscle tissue that runs from the outer side of your hip to the outer side of your thigh and knee. 1. Lie on your side with your left / right leg in the top position. 2. Bend both of your knees and grab your left / right ankle. Stretch out your bottom arm to help you balance. 3. Slowly bring your top knee back so your thigh goes behind your trunk. 4. Slowly lower your top leg toward the floor until you feel a gentle stretch on the outside of your left / right hip and thigh. If you do not feel a stretch and your knee will not fall farther, place the heel of your other foot on top of your knee and pull your knee down toward the floor with your foot. 5. Hold this position for __________ seconds. Repeat __________ times. Complete this exercise   times a day. Strengthening exercises These exercises build strength and endurance in your hip and pelvis. Endurance is the ability to use your muscles for a long time, even after they get tired. Straight leg raises, side-lying This exercise strengthens the muscles that rotate the leg at the hip and move it away from your body (hip abductors). 1. Lie on your side with your left / right leg in the top position. Lie so your head, shoulder, hip, and knee line up. You may bend your bottom knee to help you balance. 2. Roll your hips slightly forward so your hips are stacked directly over each other and your left / right knee is facing forward. 3. Tense the muscles in your outer thigh and lift your top leg 4-6 inches (10-15 cm). 4. Hold this position for __________ seconds. 5. Slowly return to the starting position. Let your muscles relax completely before doing another repetition. Repeat __________ times. Complete this exercise __________ times a day. Leg raises, prone This exercise strengthens the muscles that move the hips (hip extensors). 1. Lie on your abdomen on your bed or a firm  surface. You can put a pillow under your hips if that is more comfortable for your lower back. 2. Bend your left / right knee so your foot is straight up in the air. 3. Squeeze your buttocks muscles and lift your left / right thigh off the bed. Do not let your back arch. 4. Tense your thigh muscle as hard as you can without increasing any knee pain. 5. Hold this position for __________ seconds. 6. Slowly lower your leg to the starting position and allow it to relax completely. Repeat __________ times. Complete this exercise __________ times a day. Hip hike 1. Stand sideways on a bottom step. Stand on your left / right leg with your other foot unsupported next to the step. You can hold on to the railing or wall for balance if needed. 2. Keep your knees straight and your torso square. Then lift your left / right hip up toward the ceiling. 3. Slowly let your left / right hip lower toward the floor, past the starting position. Your foot should get closer to the floor. Do not lean or bend your knees. Repeat __________ times. Complete this exercise __________ times a day. This information is not intended to replace advice given to you by your health care provider. Make sure you discuss any questions you have with your health care provider. Document Revised: 03/05/2019 Document Reviewed: 09/03/2018 Elsevier Patient Education  2020 [employer].   COVID-19 vaccine recommendations:   COVID-19 vaccine is recommended for everyone (unless you are allergic to a vaccine component), even if you are on a medication that suppresses your immune system.   If you are on Methotrexate, Cellcept (mycophenolate), Rinvoq, Morrie Sheldon, and Olumiant- hold the medication for 1 week after each vaccine. Hold Methotrexate for 2 weeks after the single dose COVID-19 vaccine.   If you are on Orencia subcutaneous injection - hold medication one week prior to and one week after the first COVID-19 vaccine dose (only).   If you are  on Orencia IV infusions- time vaccination administration so that the first COVID-19 vaccination will occur four weeks after the infusion and postpone the subsequent infusion by one week.   If you are on Cyclophosphamide or Rituxan infusions please contact your doctor prior to receiving the COVID-19 vaccine.   Do not take Tylenol or any anti-inflammatory medications (NSAIDs) 24 hours prior to the COVID-19 vaccination.  There is no direct evidence about the efficacy of the COVID-19 vaccine in individuals who are on medications that suppress the immune system.   Even if you are fully vaccinated, and you are on any medications that suppress your immune system, please continue to wear a mask, maintain at least six feet social distance and practice hand hygiene.   If you develop a COVID-19 infection, please contact your PCP or our office to determine if you need antibody infusion.  The booster vaccine is now available for immunocompromised patients. It is advised that if you had Pfizer vaccine you should get [employer] booster.  If you had a Moderna vaccine then you should get a Moderna booster. Johnson and Wynetta Emery does not have a booster vaccine at this time.  Please see the following web sites for updated information.   https://www.[facility]heumat[facility].org/Portals/0/Files/COVID-19-Vaccination-Patient-Resources.pdf  https://www.[facility]heumatology.org/About-Us/Newsroom/Press-Releases/ID/1159

## 2020-08-10 NOTE — Progress Notes (Signed)
Complements WNL

## 2020-08-10 NOTE — Progress Notes (Signed)
Ok to add ANA if she would like it rechecked.  It was not added this time because it does not correlate with disease activity and was negative on 11/13/19.

## 2020-08-10 NOTE — Progress Notes (Signed)
UA +1 leukocytes.  Negative for nitrites.  Please advise the patient to contact PCP if she develops signs or symptoms of a UTI.    ESR WNL.  Vitamin D is WNL.  Please recommend a maintenance dose of vitamin D.

## 2020-08-10 NOTE — Progress Notes (Signed)
DsDNA is negative. Complements pending.

## 2020-08-12 MED FILL — HYDROXYCHLOROQUINE 200 MG T: 200 | 90 days supply | Qty: 45 | Fill #0

## 2020-08-15 LAB — URINALYSIS, ROUTINE W REFLEX MICROSCOPIC
Bacteria, UA: NONE SEEN /HPF
Bilirubin Urine: NEGATIVE
Glucose, UA: NEGATIVE
Hgb urine dipstick: NEGATIVE
Hyaline Cast: NONE SEEN /LPF
Ketones, ur: NEGATIVE
Nitrite: NEGATIVE
Protein, ur: NEGATIVE
RBC / HPF: NONE SEEN /HPF (ref 0–2)
Specific Gravity, Urine: 1.02 (ref 1.001–1.03)
pH: 7 (ref 5.0–8.0)

## 2020-08-15 LAB — SEDIMENTATION RATE: Sed Rate: 6 mm/h (ref 0–30)

## 2020-08-15 LAB — ANA: Anti Nuclear Antibody (ANA): POSITIVE — AB

## 2020-08-15 LAB — TEST AUTHORIZATION

## 2020-08-15 LAB — C3 AND C4
C3 Complement: 143 mg/dL (ref 83–193)
C4 Complement: 38 mg/dL (ref 15–57)

## 2020-08-15 LAB — VITAMIN D 25 HYDROXY (VIT D DEFICIENCY, FRACTURES): Vit D, 25-Hydroxy: 34 ng/mL (ref 30–100)

## 2020-08-15 LAB — ANTI-NUCLEAR AB-TITER (ANA TITER): ANA Titer 1: 1:320 {titer} — AB

## 2020-08-15 LAB — ANTI-DNA ANTIBODY, DOUBLE-STRANDED: ds DNA Ab: <1 IU/mL

## 2020-08-15 NOTE — Progress Notes (Signed)
ANA is positive-1:320 nuclear, dense fine speckled.  Labs are not consistent with a flare.  No change in therapy recommended at this time.

## 2020-08-23 DIAGNOSIS — F4320 Adjustment disorder, unspecified: Secondary | ICD-10-CM | POA: Diagnosis not present

## 2020-09-20 DIAGNOSIS — D2230 Melanocytic nevi of unspecified part of face: Secondary | ICD-10-CM | POA: Diagnosis not present

## 2020-09-20 DIAGNOSIS — D225 Melanocytic nevi of trunk: Secondary | ICD-10-CM | POA: Diagnosis not present

## 2020-09-20 DIAGNOSIS — Z86018 Personal history of other benign neoplasm: Secondary | ICD-10-CM | POA: Diagnosis not present

## 2020-09-20 DIAGNOSIS — L814 Other melanin hyperpigmentation: Secondary | ICD-10-CM | POA: Diagnosis not present

## 2020-09-20 DIAGNOSIS — D2221 Melanocytic nevi of right ear and external auricular canal: Secondary | ICD-10-CM | POA: Diagnosis not present

## 2020-09-20 DIAGNOSIS — L821 Other seborrheic keratosis: Secondary | ICD-10-CM | POA: Diagnosis not present

## 2020-09-20 DIAGNOSIS — L578 Other skin changes due to chronic exposure to nonionizing radiation: Secondary | ICD-10-CM | POA: Diagnosis not present

## 2020-10-26 DIAGNOSIS — F4320 Adjustment disorder, unspecified: Secondary | ICD-10-CM | POA: Diagnosis not present

## 2020-10-27 ENCOUNTER — Ambulatory Visit (INDEPENDENT_AMBULATORY_CARE_PROVIDER_SITE_OTHER): Payer: 59

## 2020-10-27 ENCOUNTER — Ambulatory Visit (INDEPENDENT_AMBULATORY_CARE_PROVIDER_SITE_OTHER): Payer: 59 | Admitting: Podiatry

## 2020-10-27 ENCOUNTER — Encounter: Payer: Self-pay | Admitting: Podiatry

## 2020-10-27 ENCOUNTER — Other Ambulatory Visit: Payer: Self-pay

## 2020-10-27 DIAGNOSIS — S9031XA Contusion of right foot, initial encounter: Secondary | ICD-10-CM

## 2020-10-27 DIAGNOSIS — L923 Foreign body granuloma of the skin and subcutaneous tissue: Secondary | ICD-10-CM | POA: Diagnosis not present

## 2020-10-27 NOTE — Progress Notes (Signed)
Subjective:   Patient ID: Sherry Jacobs, female   DOB: 54 y.o.   MRN: [id number]   HPI Patient presents stating that she has a what she thinks is a splinter in her right foot and its been very sore to walk on for the last week.  Does not remember specific injury but states that it has been irritated    ROS      Objective:  Physical Exam  Neurovascular status intact with patient found to have a small foreign body in the distal fourth metatarsal right localized with no proximal edema erythema drainage noted      Assessment:  Probability that this is a foreign body or possible splinter or hair from animal     Plan:  HEP educated her on this sharp sterile debridement accomplished as able to remove the superficial foreign body and flush the area and applied sterile dressing.  Patient will be seen back to recheck

## 2020-11-30 ENCOUNTER — Other Ambulatory Visit: Payer: Self-pay | Admitting: Physician Assistant

## 2020-11-30 DIAGNOSIS — M359 Systemic involvement of connective tissue, unspecified: Secondary | ICD-10-CM

## 2020-11-30 MED ORDER — HYDROXYCHLOROQUINE SULFATE 200 MG PO TABS: ORAL_TABLET | ORAL | 0 refills | Status: DC

## 2020-11-30 NOTE — Telephone Encounter (Signed)
Last Visit: 08/09/2020 Next Visit: 01/10/2021 Labs: 08/04/2020 Glucose 104, BUN 22 Eye exam: 07/13/2020 WNL   Current Dose per office note 08/09/2020: Plaquenil 200 mg 1 tablet by mouth every other day DX: Autoimmune disease   Okay to refill Plaquenil?

## 2020-12-08 MED FILL — HYDROXYCHLOROQUINE 200 MG T: 200 | 90 days supply | Qty: 45 | Fill #0

## 2020-12-20 ENCOUNTER — Other Ambulatory Visit (HOSPITAL_BASED_OUTPATIENT_CLINIC_OR_DEPARTMENT_OTHER): Payer: Self-pay | Admitting: Dermatology

## 2020-12-20 MED FILL — FLUTICASONE PROP 0.005% OIN: 0.005 | 15 days supply | Qty: 30 | Fill #0

## 2020-12-29 NOTE — Progress Notes (Signed)
Office Visit Note  Patient: Sherry Jacobs             Date of Birth: 1966-04-15           MRN: [id number]             PCP: Ann Held, DO Referring: Ann Held, * Visit Date: 01/10/2021 Occupation: '@GUAROCC' @  Subjective:  Medication monitoring   History of Present Illness: Sherry Jacobs is a 55 y.o. female with history of autoimmune disease and fibromyalgia. She is taking plaquenil 200 mg 1 tablet by mouth every other day.  She is tolerating Plaquenil without any side effects.  She denies any signs or symptoms of a flare.  She has not developed any new or worsening symptoms on the reduced dose of Plaquenil.  She denies any joint pain or joint swelling at this time.  She states that she changed jobs in November 2021 and has been more sedentary with the change.  She has had some tenderness over bilateral trochanteric bursa and IT bands.  She plans on resuming stretching exercises. She denies any recent rashes, photosensitivity, or hair loss. She denies any symptoms of raynaud's.  She has not had any sicca symptoms or oral/nasal ulcerations.  She denies any swollen lymph nodes or infections.  She denies any chest pain, palpitations, or shortness of breath.   She has been taking a multivitamin daily which has vitamin D included.    Activities of Daily Living:  Patient reports morning stiffness for 0 minute.   Patient Denies nocturnal pain.  Difficulty dressing/grooming: Denies Difficulty climbing stairs: Denies Difficulty getting out of chair: Denies Difficulty using hands for taps, buttons, cutlery, and/or writing: Denies  Review of Systems  Constitutional: Negative for fatigue.  HENT: Negative for mouth sores, mouth dryness and nose dryness.   Eyes: Negative for pain, visual disturbance and dryness.  Respiratory: Negative for cough, hemoptysis, shortness of breath and difficulty breathing.   Cardiovascular: Negative for chest pain, palpitations, hypertension  and swelling in legs/feet.  Gastrointestinal: Negative for blood in stool, constipation and diarrhea.  Endocrine: Negative for increased urination.  Genitourinary: Negative for painful urination.  Musculoskeletal: Negative for arthralgias, joint pain, joint swelling, myalgias, muscle weakness, morning stiffness, muscle tenderness and myalgias.  Skin: Negative for color change, pallor, rash, hair loss, nodules/bumps, skin tightness, ulcers and sensitivity to sunlight.  Allergic/Immunologic: Negative for susceptible to infections.  Neurological: Negative for dizziness, numbness, headaches and weakness.  Hematological: Negative for swollen glands.  Psychiatric/Behavioral: Positive for sleep disturbance. Negative for depressed mood. The patient is not nervous/anxious.     PMFS History:  Patient Active Problem List   Diagnosis Date Noted  . Preventative health care 01/02/2018  . URI (upper respiratory infection) 02/19/2017  . Neck strain 02/19/2017  . High risk medication use 01/02/2017  . Right ankle pain 08/05/2013  . Cellulitis, toe 03/21/2012  . UNSPECIFIED VITAMIN D DEFICIENCY 02/02/2009  . 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 LEG PAIN, LEFT 10/28/2008  . ANXIETY 06/23/2008  . UNSPECIFIED OSTEOPOROSIS 12/29/2007  . HYPERLIPIDEMIA 10/02/2007  . INJURY, 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 09/09/2007  . Palpitations 09/09/2007  . ANA POSITIVE, HX OF 09/09/2007  . IRRITABLE BOWEL SYNDROME 04/11/2007  . FIBROMYALGIA 04/11/2007    Past Medical History:  Diagnosis Date  . ANA positive   . Arthralgia   . Autoimmune disease (Sherry Jacobs)    ANA positive  . Endometrial polyp   . IBS (irritable bowel syndrome)   . Iron  deficiency anemia   . Lactose intolerance in adult   . PONV (postoperative nausea and vomiting)   . Social anxiety disorder   . Wears contact lenses     Family History  Problem Relation Age of Onset  . Breast cancer Mother   . Colon polyps Father   . Diabetes Unknown   . Hypertension  Unknown   . Alcohol abuse Unknown   . Hyperlipidemia Unknown   . Arthritis Unknown   . Stroke Unknown   . Thyroid disease Unknown   . Uterine cancer Unknown   . Breast cancer Paternal Grandmother   . Melanoma Maternal Grandfather   . Asthma Daughter        exercise induced   . Asthma Son        exercise induced    Past Surgical History:  Procedure Laterality Date  . APPENDECTOMY  1981  . DILATATION & CURETTAGE/HYSTEROSCOPY WITH MYOSURE N/A 10/28/2014   Procedure: DILATATION & CURETTAGE/HYSTEROSCOPY WITH MYOSURE;  Surgeon: Sherry Pearson, MD;  Location: [facility];  Service: Gynecology;  Laterality: N/A;  . 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 H1N1 10/13/2008  . Influenza Whole 11/26/2004, 09/15/2008, 08/30/2010  . Influenza-Unspecified 08/27/2011, 08/26/2016, 09/05/2019  . PFIZER(Purple Top)SARS-COV-2 Vaccination 11/17/2019, 12/08/2019  . Pneumococcal Polysaccharide-23 05/11/2011  . Td 12/15/2008     Objective: Vital Signs: BP 121/75 (BP Location: Right Arm, Patient Position: Sitting, Cuff Size: Normal)   Pulse 76   Resp 14   Ht '5\' 7"'  (1.702 m)   Wt 203 lb 6.4 oz (92.3 kg)   LMP 04/07/2017   BMI 31.86 kg/m    Physical Exam Vitals and nursing note reviewed.  Constitutional:      Appearance: She is well-developed and well-nourished.  HENT:     Head: Normocephalic and atraumatic.  Eyes:     Extraocular Movements: EOM normal.     Conjunctiva/sclera: Conjunctivae normal.  Cardiovascular:     Pulses: Intact distal pulses.  Pulmonary:     Effort: Pulmonary effort is normal.  Abdominal:     Palpations: Abdomen is  soft.  Musculoskeletal:     Cervical back: Normal range of motion.  Skin:    General: Skin is warm and dry.     Capillary Refill: Capillary refill takes less than 2 seconds.  Neurological:     Mental Status: She is alert and oriented to person, place, and time.  Psychiatric:        Mood and Affect: Mood and affect normal.        Behavior: Behavior normal.      Musculoskeletal Exam: C-spine, thoracic spine, and lumbar spine good ROM.  No midline spinal tenderness.  No SI joint tenderness.  Shoulder joints, elbow joints, wrist joints, MCPs, PIPs, and DIPs good ROM with no synovitis.  Hip joints, knee joints, and ankle joints good ROM with no discomfort.  No warmth or effusion of knee joints.  No tenderness or swelling of ankle joints.  Tenderness over the left trochanteric bursa.    CDAI Exam: CDAI  Score: -- Patient Global: --; Provider Global: -- Swollen: --; Tender: -- Joint Exam 01/10/2021   No joint exam has been documented for this visit   There is currently no information documented on the homunculus. Go to the Rheumatology activity and complete the homunculus joint exam.  Investigation: No additional findings.  Imaging: No results found.  Recent Labs: Lab Results  Component Value Date   WBC 5.9 08/04/2020   HGB 13.1 08/04/2020   PLT 257 08/04/2020   NA 140 08/04/2020   K 3.9 08/04/2020   CL 104 08/04/2020   CO2 27 08/04/2020   GLUCOSE 104 (H) 08/04/2020   BUN 22 (H) 08/04/2020   CREATININE 0.87 08/04/2020   BILITOT 0.7 08/04/2020   ALKPHOS 70 08/04/2020   AST 20 08/04/2020   ALT 25 08/04/2020   PROT 7.4 08/04/2020   ALBUMIN 4.2 08/04/2020   CALCIUM 9.2 08/04/2020   GFRAA >60 08/04/2020    Speciality Comments: PLQ EYe Exam: 07/13/2020 WNL @ [facility].  Procedures:  No procedures performed Allergies: Sulfa antibiotics      Assessment / Plan:     Visit Diagnoses: Autoimmune disease (Post) - +ANA, low C3, arthralgia, fatigue, Sicca symptoms,  and Raynaud's: She has not had any signs or symptoms of a flare recently.  She is clinically doing well taking Plaquenil 200 mg 1 tablet by mouth every other day.  She is tolerating Plaquenil without any side effects.  She has not noticed any new or worsening symptoms on the reduced dose of Plaquenil.  She is not experiencing any arthralgias, joint stiffness, or inflammation.  No synovitis was noted on examination today.  She has not had any recent rashes, photosensitivity, or hair loss.  We discussed the importance of wearing sunscreen SPF >50 on a daily basis and avoiding direct sun exposure. She has not been experiencing any fatigue.  She has not had any sicca symptoms or oral/nasal ulcerations.  She has not had any chest pain, palpitations, or shortness of breath.  She has not had any swollen lymph nodes or recent infections.  Her symptoms of Raynaud's have not been active recently.  Lab work from 08/09/20 was reviewed today in the office: complements WNL,  ESR WNL, dsDNA negative, and vitamin D WNL.  We will obtain the following lab work today.  She will continue taking Plaquenil 200 mg 1 tablet by mouth every other day.  She was advised to notify us if she develops signs or symptoms of a flare.  She will follow-up in the office in 5 months.- Plan: Urinalysis, Routine w reflex microscopic, COMPLETE METABOLIC PANEL WITH GFR, CBC with Differential/Platelet, Anti-DNA antibody, double-stranded, C3 and C4, Sedimentation rate, VITAMIN D 25 Hydroxy (Vit-D Deficiency, Fractures), ANA  High risk medication use - Plaquenil 200 mg 1 tablet by mouth every other day. CBC and CMP updated on 08/09/20.  She is due to updated lab work.  Orders for CBC and CMP were released. PLQ eye exam: 07/13/2020 WNL @ [facility]. - Plan: COMPLETE METABOLIC PANEL WITH GFR, CBC with Differential/Platelet She has not had any recent infections.   Raynaud's disease without gangrene: Not currently active.  She has good capillary refill  less than 2 seconds.  No digital ulcerations or signs of gangrene were noted.   Sicca syndrome ([place]): She has not had any sicca symptoms recently.  Vitamin D deficiency - She takes a multivitamin with vitamin D.  We will check her vitamin D level today.  Plan: VITAMIN  D 25 Hydroxy (Vit-D Deficiency, Fractures)  Trochanteric bursitis, right hip: She has intermittent tenderness over the right trochanteric bursa and IT band. She was encouraged to perform stretching exercises on a daily basis.  She is given a handout of these exercises to perform.  Trochanteric bursitis, left hip: She has tenderness over the left trochanteric bursa.  She has good ROM of the left hip joint with no discomfort.  She was advised to perform stretching exercises on a daily basis.   Fibromyalgia: She has not had any recent fibromyalgia flares.  She is not experiencing any fatigue at this time.  She continues to have difficulty staying asleep at night.    Other fatigue: She is not experiencing any fatigue at this time.   Other medical conditions are listed as follows:   History of hyperlipidemia  History of IBS  History of anxiety  Orders: Orders Placed This Encounter  Procedures  . Urinalysis, Routine w reflex microscopic  . COMPLETE METABOLIC PANEL WITH GFR  . CBC with Differential/Platelet  . Anti-DNA antibody, double-stranded  . C3 and C4  . Sedimentation rate  . VITAMIN D 25 Hydroxy (Vit-D Deficiency, Fractures)  . ANA   No orders of the defined types were placed in this encounter.     Follow-Up Instructions: Return in about 5 months (around 06/09/2021) for Autoimmune Disease.   Ofilia Neas, PA-C  Note - This record has been created using Dragon software.  Chart creation errors have been sought, but may not always  have been located. Such creation errors do not reflect on  the standard of medical care.

## 2021-01-02 ENCOUNTER — Other Ambulatory Visit (HOSPITAL_BASED_OUTPATIENT_CLINIC_OR_DEPARTMENT_OTHER): Payer: Self-pay | Admitting: Dermatology

## 2021-01-02 MED FILL — TRETINOIN 0.025% CREAM: 0.025 | 30 days supply | Qty: 45 | Fill #0

## 2021-01-09 MED FILL — TRETINOIN 0.025% CREAM: 0.025 | 13 days supply | Qty: 20 | Fill #0

## 2021-01-10 ENCOUNTER — Ambulatory Visit (INDEPENDENT_AMBULATORY_CARE_PROVIDER_SITE_OTHER): Payer: 59 | Admitting: Physician Assistant

## 2021-01-10 ENCOUNTER — Encounter: Payer: Self-pay | Admitting: Physician Assistant

## 2021-01-10 ENCOUNTER — Other Ambulatory Visit: Payer: Self-pay

## 2021-01-10 VITALS — BP 121/75 | HR 76 | Resp 14 | Ht 67.0 in | Wt 203.4 lb

## 2021-01-10 DIAGNOSIS — M7061 Trochanteric bursitis, right hip: Secondary | ICD-10-CM

## 2021-01-10 DIAGNOSIS — I7300 Raynaud's syndrome without gangrene: Secondary | ICD-10-CM

## 2021-01-10 DIAGNOSIS — Z79899 Other long term (current) drug therapy: Secondary | ICD-10-CM | POA: Diagnosis not present

## 2021-01-10 DIAGNOSIS — E559 Vitamin D deficiency, unspecified: Secondary | ICD-10-CM | POA: Diagnosis not present

## 2021-01-10 DIAGNOSIS — Z8659 Personal history of other mental and behavioral disorders: Secondary | ICD-10-CM

## 2021-01-10 DIAGNOSIS — M359 Systemic involvement of connective tissue, unspecified: Secondary | ICD-10-CM | POA: Diagnosis not present

## 2021-01-10 DIAGNOSIS — M797 Fibromyalgia: Secondary | ICD-10-CM

## 2021-01-10 DIAGNOSIS — M3500 Sicca syndrome, unspecified: Secondary | ICD-10-CM

## 2021-01-10 DIAGNOSIS — M7062 Trochanteric bursitis, left hip: Secondary | ICD-10-CM | POA: Diagnosis not present

## 2021-01-10 DIAGNOSIS — Z8719 Personal history of other diseases of the digestive system: Secondary | ICD-10-CM

## 2021-01-10 DIAGNOSIS — R5383 Other fatigue: Secondary | ICD-10-CM | POA: Diagnosis not present

## 2021-01-10 DIAGNOSIS — Z8639 Personal history of other endocrine, nutritional and metabolic disease: Secondary | ICD-10-CM

## 2021-01-10 NOTE — Patient Instructions (Addendum)
Hip Bursitis Rehab Ask your health care provider which exercises are safe for you. Do exercises exactly as told by your health care provider and adjust them as directed. It is normal to feel mild stretching, pulling, tightness, or discomfort as you do these exercises. Stop right away if you feel sudden pain or your pain gets worse. Do not begin these exercises until told by your health care provider. Stretching exercise This exercise warms up your muscles and joints and improves the movement and flexibility of your hip. This exercise also helps to relieve pain and stiffness. Iliotibial band stretch An iliotibial band is a strong band of muscle tissue that runs from the outer side of your hip to the outer side of your thigh and knee. 1. Lie on your side with your left / right leg in the top position. 2. Bend your left / right knee and grab your ankle. Stretch out your bottom arm to help you balance. 3. Slowly bring your knee back so your thigh is behind your body. 4. Slowly lower your knee toward the floor until you feel a gentle stretch on the outside of your left / right thigh. If you do not feel a stretch and your knee will not fall farther, place the heel of your other foot on top of your knee and pull your knee down toward the floor with your foot. 5. Hold this position for __________ seconds. 6. Slowly return to the starting position. Repeat __________ times. Complete this exercise __________ times a day.   Strengthening exercises These exercises build strength and endurance in your hip and pelvis. Endurance is the ability to use your muscles for a long time, even after they get tired. Bridge This exercise strengthens the muscles that move your thigh backward (hip extensors). 1. Lie on your back on a firm surface with your knees bent and your feet flat on the floor. 2. Tighten your buttocks muscles and lift your buttocks off the floor until your trunk is level with your thighs. ? Do not arch  your back. ? You should feel the muscles working in your buttocks and the back of your thighs. If you do not feel these muscles, slide your feet 1-2 inches (2.5-5 cm) farther away from your buttocks. ? If this exercise is too easy, try doing it with your arms crossed over your chest. 3. Hold this position for __________ seconds. 4. Slowly lower your hips to the starting position. 5. Let your muscles relax completely after each repetition. Repeat __________ times. Complete this exercise __________ times a day.   Squats This exercise strengthens the muscles in front of your thigh and knee (quadriceps). 1. Stand in front of a table, with your feet and knees pointing straight ahead. You may rest your hands on the table for balance but not for support. 2. Slowly bend your knees and lower your hips like you are going to sit in a chair. ? Keep your weight over your heels, not over your toes. ? Keep your lower legs upright so they are parallel with the table legs. ? Do not let your hips go lower than your knees. ? Do not bend lower than told by your health care provider. ? If your hip pain increases, do not bend as low. 3. Hold the squat position for __________ seconds. 4. Slowly push with your legs to return to standing. Do not use your hands to pull yourself to standing. Repeat __________ times. Complete this exercise __________ times a day.  Hip hike 1. Stand sideways on a bottom step. Stand on your left / right leg with your other foot unsupported next to the step. You can hold on to the railing or wall for balance if needed. 2. Keep your knees straight and your torso square. Then lift your left / right hip up toward the ceiling. 3. Hold this position for __________ seconds. 4. Slowly let your left / right hip lower toward the floor, past the starting position. Your foot should get closer to the floor. Do not lean or bend your knees. Repeat __________ times. Complete this exercise __________ times  a day. Single leg stand 1. Without shoes, stand near a railing or in a doorway. You may hold on to the railing or door frame as needed for balance. 2. Squeeze your left / right buttock muscles, then lift up your other foot. ? Do not let your left / right hip push out to the side. ? It is helpful to stand in front of a mirror for this exercise so you can watch your hip. 3. Hold this position for __________ seconds. Repeat __________ times. Complete this exercise __________ times a day. This information is not intended to replace advice given to you by your health care provider. Make sure you discuss any questions you have with your health care provider. Document Revised: 03/09/2019 Document Reviewed: 03/09/2019 Elsevier Patient Education  [place] Band Syndrome Rehab Ask your health care provider which exercises are safe for you. Do exercises exactly as told by your health care provider and adjust them as directed. It is normal to feel mild stretching, pulling, tightness, or discomfort as you do these exercises. Stop right away if you feel sudden pain or your pain gets significantly worse. Do not begin these exercises until told by your health care provider. Stretching and range-of-motion exercises These exercises warm up your muscles and joints and improve the movement and flexibility of your hip and pelvis. Quadriceps stretch, prone 1. Lie on your abdomen (prone position) on a firm surface, such as a bed or padded floor. 2. Bend your left / right knee and reach back to hold your ankle or pant leg. If you cannot reach your ankle or pant leg, loop a belt around your foot and grab the belt instead. 3. Gently pull your heel toward your buttocks. Your knee should not slide out to the side. You should feel a stretch in the front of your thigh and knee (quadriceps). 4. Hold this position for __________ seconds. Repeat __________ times. Complete this exercise __________ times  a day.   Iliotibial band stretch An iliotibial band is a strong band of muscle tissue that runs from the outer side of your hip to the outer side of your thigh and knee. 1. Lie on your side with your left / right leg in the top position. 2. Bend both of your knees and grab your left / right ankle. Stretch out your bottom arm to help you balance. 3. Slowly bring your top knee back so your thigh goes behind your trunk. 4. Slowly lower your top leg toward the floor until you feel a gentle stretch on the outside of your left / right hip and thigh. If you do not feel a stretch and your knee will not fall farther, place the heel of your other foot on top of your knee and pull your knee down toward the floor with your foot. 5. Hold this position for __________  seconds. Repeat __________ times. Complete this exercise __________ times a day.   Strengthening exercises These exercises build strength and endurance in your hip and pelvis. Endurance is the ability to use your muscles for a long time, even after they get tired. Straight leg raises, side-lying This exercise strengthens the muscles that rotate the leg at the hip and move it away from your body (hip abductors). 1. Lie on your side with your left / right leg in the top position. Lie so your head, shoulder, hip, and knee line up. You may bend your bottom knee to help you balance. 2. Roll your hips slightly forward so your hips are stacked directly over each other and your left / right knee is facing forward. 3. Tense the muscles in your outer thigh and lift your top leg 4-6 inches (10-15 cm). 4. Hold this position for __________ seconds. 5. Slowly lower your leg to return to the starting position. Let your muscles relax completely before doing another repetition. Repeat __________ times. Complete this exercise __________ times a day.   Leg raises, prone This exercise strengthens the muscles that move the hips backward (hip extensors). 1. Lie on  your abdomen (prone position) on your bed or a firm surface. You can put a pillow under your hips if that is more comfortable for your lower back. 2. Bend your left / right knee so your foot is straight up in the air. 3. Squeeze your buttocks muscles and lift your left / right thigh off the bed. Do not let your back arch. 4. Tense your thigh muscle as hard as you can without increasing any knee pain. 5. Hold this position for __________ seconds. 6. Slowly lower your leg to return to the starting position and allow it to relax completely. Repeat __________ times. Complete this exercise __________ times a day. Hip hike 1. Stand sideways on a bottom step. Stand on your left / right leg with your other foot unsupported next to the step. You can hold on to a railing or wall for balance if needed. 2. Keep your knees straight and your torso square. Then lift your left / right hip up toward the ceiling. 3. Slowly let your left / right hip lower toward the floor, past the starting position. Your foot should get closer to the floor. Do not lean or bend your knees. Repeat __________ times. Complete this exercise __________ times a day. This information is not intended to replace advice given to you by your health care provider. Make sure you discuss any questions you have with your health care provider. Document Revised: 01/20/2020 Document Reviewed: 01/20/2020 Elsevier Patient Education  [place].

## 2021-01-12 LAB — COMPLETE METABOLIC PANEL WITH GFR
AG Ratio: 2.1 (calc) (ref 1.0–2.5)
ALT: 29 U/L (ref 6–29)
AST: 21 U/L (ref 10–35)
Albumin: 4.6 g/dL (ref 3.6–5.1)
Alkaline phosphatase (APISO): 64 U/L (ref 37–153)
BUN: 17 mg/dL (ref 7–25)
CO2: 29 mmol/L (ref 20–32)
Calcium: 9.8 mg/dL (ref 8.6–10.4)
Chloride: 101 mmol/L (ref 98–110)
Creat: 0.86 mg/dL (ref 0.50–1.05)
GFR, Est African American: 88 mL/min/{1.73_m2} (ref >=60)
GFR, Est Non African American: 76 mL/min/{1.73_m2} (ref >=60)
Globulin: 2.2 g/dL (calc) (ref 1.9–3.7)
Glucose, Bld: 87 mg/dL (ref 65–99)
Potassium: 4.3 mmol/L (ref 3.5–5.3)
Sodium: 138 mmol/L (ref 135–146)
Total Bilirubin: 0.3 mg/dL (ref 0.2–1.2)
Total Protein: 6.8 g/dL (ref 6.1–8.1)

## 2021-01-12 LAB — VITAMIN D 25 HYDROXY (VIT D DEFICIENCY, FRACTURES): Vit D, 25-Hydroxy: 40 ng/mL (ref 30–100)

## 2021-01-12 LAB — ANTI-NUCLEAR AB-TITER (ANA TITER): ANA Titer 1: 1:160 {titer} — ABNORMAL HIGH

## 2021-01-12 LAB — URINALYSIS, ROUTINE W REFLEX MICROSCOPIC
Bilirubin Urine: NEGATIVE
Glucose, UA: NEGATIVE
Hgb urine dipstick: NEGATIVE
Ketones, ur: NEGATIVE
Leukocytes,Ua: NEGATIVE
Nitrite: NEGATIVE
Protein, ur: NEGATIVE
Specific Gravity, Urine: 1.006 (ref 1.001–1.03)
pH: 6.5 (ref 5.0–8.0)

## 2021-01-12 LAB — ANTI-DNA ANTIBODY, DOUBLE-STRANDED: ds DNA Ab: <1 IU/mL

## 2021-01-12 LAB — CBC WITH DIFFERENTIAL/PLATELET
Absolute Monocytes: 536 cells/uL (ref 200–950)
Basophils Absolute: 38 cells/uL (ref 0–200)
Basophils Relative: 0.6 %
Eosinophils Absolute: 88 cells/uL (ref 15–500)
Eosinophils Relative: 1.4 %
HCT: 39.6 % (ref 35.0–45.0)
Hemoglobin: 13.5 g/dL (ref 11.7–15.5)
Lymphs Abs: 1751 cells/uL (ref 850–3900)
MCH: 28.3 pg (ref 27.0–33.0)
MCHC: 34.1 g/dL (ref 32.0–36.0)
MCV: 83 fL (ref 80.0–100.0)
MPV: 11.1 fL (ref 7.5–12.5)
Monocytes Relative: 8.5 %
Neutro Abs: 3887 cells/uL (ref 1500–7800)
Neutrophils Relative %: 61.7 %
Platelets: 276 10*3/uL (ref 140–400)
RBC: 4.77 10*6/uL (ref 3.80–5.10)
RDW: 12.9 % (ref 11.0–15.0)
Total Lymphocyte: 27.8 %
WBC: 6.3 10*3/uL (ref 3.8–10.8)

## 2021-01-12 LAB — SEDIMENTATION RATE: Sed Rate: 2 mm/h (ref 0–30)

## 2021-01-12 LAB — C3 AND C4
C3 Complement: 137 mg/dL (ref 83–193)
C4 Complement: 35 mg/dL (ref 15–57)

## 2021-01-12 LAB — ANA: Anti Nuclear Antibody (ANA): POSITIVE — AB

## 2021-01-13 NOTE — Progress Notes (Signed)
ANA is low titer-1:160 nuclear, dense fine speckled.  dsDNA negative. ESR WNL.  Complements WNL. UA normal. CBC and CMP WNL.  Vitamin D is WNL-40.  Please advise the patient to continue a maintenance dose of vitamin D.

## 2021-01-18 MED FILL — TRETINOIN 0.025% CREAM: 0.025 | 13 days supply | Qty: 20 | Fill #0

## 2021-01-26 ENCOUNTER — Telehealth: Payer: 59 | Admitting: Physician Assistant

## 2021-01-26 ENCOUNTER — Other Ambulatory Visit: Payer: Self-pay | Admitting: Physician Assistant

## 2021-01-26 DIAGNOSIS — R300 Dysuria: Secondary | ICD-10-CM

## 2021-01-26 MED ORDER — CEPHALEXIN 500 MG PO CAPS: 500.0000 mg | ORAL_CAPSULE | Freq: Two times a day (BID) | ORAL | 0 refills | Status: DC

## 2021-01-26 MED FILL — CEPHALEXIN 500 MG CAPSULE: 500 | 7 days supply | Qty: 14 | Fill #0

## 2021-01-26 NOTE — Progress Notes (Signed)

## 2021-01-26 NOTE — Progress Notes (Signed)
I have spent 5 minutes in review of e-visit questionnaire, review and updating patient chart, medical decision making and response to patient.   Nahome Bublitz Cody Akeyla Molden, PA-C    

## 2021-01-30 DIAGNOSIS — N950 Postmenopausal bleeding: Secondary | ICD-10-CM | POA: Diagnosis not present

## 2021-01-31 ENCOUNTER — Other Ambulatory Visit (HOSPITAL_BASED_OUTPATIENT_CLINIC_OR_DEPARTMENT_OTHER): Payer: Self-pay | Admitting: Specialist

## 2021-02-01 DIAGNOSIS — L810 Postinflammatory hyperpigmentation: Secondary | ICD-10-CM | POA: Diagnosis not present

## 2021-02-01 DIAGNOSIS — Z1231 Encounter for screening mammogram for malignant neoplasm of breast: Secondary | ICD-10-CM | POA: Diagnosis not present

## 2021-02-01 MED FILL — ALPRAZolam 0.25 MG TABS: 0.25 | 90 days supply | Qty: 180 | Fill #0

## 2021-02-01 MED FILL — PROPRANOLOL 20 MG TABLET: 20 | 90 days supply | Qty: 270 | Fill #0

## 2021-02-08 DIAGNOSIS — N950 Postmenopausal bleeding: Secondary | ICD-10-CM | POA: Diagnosis not present

## 2021-02-14 ENCOUNTER — Telehealth: Payer: Self-pay | Admitting: Podiatry

## 2021-02-14 NOTE — Telephone Encounter (Signed)
Pt left message stating she was needing a new coreflex brace for her plantar Fasciitis.She wears a size small/medium.   I returned call and pt wants a new one states her old one is worn out. I verified we have it in stock and told pt she could pick it up. The last one she got was in 2017 with Dr Milinda Pointer. I told pt be would bill insurance. She said she would pick up tomorrow morning.

## 2021-02-22 DIAGNOSIS — M722 Plantar fascial fibromatosis: Secondary | ICD-10-CM | POA: Diagnosis not present

## 2021-05-08 ENCOUNTER — Other Ambulatory Visit (HOSPITAL_BASED_OUTPATIENT_CLINIC_OR_DEPARTMENT_OTHER): Payer: Self-pay

## 2021-05-08 MED ORDER — CARESTART COVID-19 HOME TEST VI KIT
PACK | 0 refills | Status: DC
  Filled 2021-05-08: qty 2, 4d supply, fill #0

## 2021-05-24 ENCOUNTER — Other Ambulatory Visit: Payer: Self-pay

## 2021-05-25 ENCOUNTER — Encounter: Payer: Self-pay | Admitting: Family Medicine

## 2021-05-25 ENCOUNTER — Ambulatory Visit (INDEPENDENT_AMBULATORY_CARE_PROVIDER_SITE_OTHER): Payer: 59 | Admitting: Family Medicine

## 2021-05-25 ENCOUNTER — Other Ambulatory Visit: Payer: Self-pay

## 2021-05-25 ENCOUNTER — Other Ambulatory Visit: Payer: Self-pay | Admitting: Physician Assistant

## 2021-05-25 ENCOUNTER — Other Ambulatory Visit (HOSPITAL_BASED_OUTPATIENT_CLINIC_OR_DEPARTMENT_OTHER): Payer: Self-pay

## 2021-05-25 VITALS — BP 112/82 | HR 91 | Temp 98.8°F | Resp 18 | Ht 67.0 in | Wt 199.8 lb

## 2021-05-25 DIAGNOSIS — Z1211 Encounter for screening for malignant neoplasm of colon: Secondary | ICD-10-CM

## 2021-05-25 DIAGNOSIS — Z1159 Encounter for screening for other viral diseases: Secondary | ICD-10-CM

## 2021-05-25 DIAGNOSIS — Z0000 Encounter for general adult medical examination without abnormal findings: Secondary | ICD-10-CM | POA: Diagnosis not present

## 2021-05-25 DIAGNOSIS — M359 Systemic involvement of connective tissue, unspecified: Secondary | ICD-10-CM

## 2021-05-25 MED ORDER — HYDROXYCHLOROQUINE SULFATE 200 MG PO TABS
ORAL_TABLET | ORAL | 0 refills | Status: DC
  Filled 2021-05-25: qty 45, 90d supply, fill #0

## 2021-05-25 NOTE — Telephone Encounter (Signed)
Patient called and requested PLQ refill to [facility].   Last Visit: 01/10/2021 Next Visit: 06/06/2021 Labs: 01/10/2021 ANA is low titer-1:160 nuclear, dense fine speckled.  dsDNA negative. ESR WNL. Complements WNL. UA normal. CBC and CMP WNL.  Vitamin D is WNL-40.  Please advise the patient to continue a maintenance doseof vitamin D.   Eye exam: 07/13/2020  Current Dose per office note on 01/10/2021: Plaquenil 200 mg 1 tablet by mouth every other day DX: Autoimmune disease  Last Fill: 11/30/2020  Okay to refill Plaquenil?

## 2021-05-25 NOTE — Patient Instructions (Signed)
Preventive Care 56-56 Years Old, Female Preventive care refers to lifestyle choices and visits with your health care provider that can promote health and wellness. This includes: A yearly physical exam. This is also called an annual wellness visit. Regular dental and eye exams. Immunizations. Screening for certain conditions. Healthy lifestyle choices, such as: Eating a healthy diet. Getting regular exercise. Not using drugs or products that contain nicotine and tobacco. Limiting alcohol use. What can I expect for my preventive care visit? Physical exam Your health care provider will check your: Height and weight. These may be used to calculate your BMI (body mass index). BMI is a measurement that tells if you are at a healthy weight. Heart rate and blood pressure. Body temperature. Skin for abnormal spots. Counseling Your health care provider may ask you questions about your: Past medical problems. Family's medical history. Alcohol, tobacco, and drug use. Emotional well-being. Home life and relationship well-being. Sexual activity. Diet, exercise, and sleep habits. Work and work Statistician. Access to firearms. Method of birth control. Menstrual cycle. Pregnancy history. What immunizations do I need?  Vaccines are usually given at various ages, according to a schedule. Your health care provider will recommend vaccines for you based on your age, medicalhistory, and lifestyle or other factors, such as travel or where you work. What tests do I need? Blood tests Lipid and cholesterol levels. These may be checked every 5 years, or more often if you are over 37 years old. Hepatitis C test. Hepatitis B test. Screening Lung cancer screening. You may have this screening every year starting at age 56 if you have a 30-pack-year history of smoking and currently smoke or have quit within the past 15 years. Colorectal cancer screening. All adults should have this screening starting at  age 56 and continuing until age 56. Your health care provider may recommend screening at age 56 if you are at increased risk. You will have tests every 1-10 years, depending on your results and the type of screening test. Diabetes screening. This is done by checking your blood sugar (glucose) after you have not eaten for a while (fasting). You may have this done every 1-3 years. Mammogram. This may be done every 1-2 years. Talk with your health care provider about when you should start having regular mammograms. This may depend on whether you have a family history of breast cancer. BRCA-related cancer screening. This may be done if you have a family history of breast, ovarian, tubal, or peritoneal cancers. Pelvic exam and Pap test. This may be done every 3 years starting at age 56. Starting at age 56, this may be done every 5 years if you have a Pap test in combination with an HPV test. Other tests STD (sexually transmitted disease) testing, if you are at risk. Bone density scan. This is done to screen for osteoporosis. You may have this scan if you are at high risk for osteoporosis. Talk with your health care provider about your test results, treatment options,and if necessary, the need for more tests. Follow these instructions at home: Eating and drinking  Eat a diet that includes fresh fruits and vegetables, whole grains, lean protein, and low-fat dairy products. Take vitamin and mineral supplements as recommended by your health care provider. Do not drink alcohol if: Your health care provider tells you not to drink. You are pregnant, may be pregnant, or are planning to become pregnant. If you drink alcohol: Limit how much you have to 0-1 drink a day. Be aware  of how much alcohol is in your drink. In the [place] one drink equals one 12 oz bottle of beer (355 mL), one 5 oz glass of wine (148 mL), or one 1 oz glass of hard liquor (44 mL).  Lifestyle Take daily care of your teeth and  gums. Brush your teeth every morning and night with fluoride toothpaste. Floss one time each day. Stay active. Exercise for at least 30 minutes 5 or more days each week. Do not use any products that contain nicotine or tobacco, such as cigarettes, e-cigarettes, and chewing tobacco. If you need help quitting, ask your health care provider. Do not use drugs. If you are sexually active, practice safe sex. Use a condom or other form of protection to prevent STIs (sexually transmitted infections). If you do not wish to become pregnant, use a form of birth control. If you plan to become pregnant, see your health care provider for a prepregnancy visit. If told by your health care provider, take low-dose aspirin daily starting at age 56. Find healthy ways to cope with stress, such as: Meditation, yoga, or listening to music. Journaling. Talking to a trusted person. Spending time with friends and family. Safety Always wear your seat belt while driving or riding in a vehicle. Do not drive: If you have been drinking alcohol. Do not ride with someone who has been drinking. When you are tired or distracted. While texting. Wear a helmet and other protective equipment during sports activities. If you have firearms in your house, make sure you follow all gun safety procedures. What's next? Visit your health care provider once a year for an annual wellness visit. Ask your health care provider how often you should have your eyes and teeth checked. Stay up to date on all vaccines. This information is not intended to replace advice given to you by your health care provider. Make sure you discuss any questions you have with your healthcare provider. Document Revised: 08/16/2020 Document Reviewed: 07/24/2018 Elsevier Patient Education  2022 [employer].

## 2021-05-25 NOTE — Assessment & Plan Note (Signed)
ghm utd Check labs  See AVs

## 2021-05-25 NOTE — Progress Notes (Signed)
Office Visit Note  Patient: Sherry Jacobs             Date of Birth: 1966/06/02           MRN: [id number]             PCP: Ann Held, DO Referring: Ann Held, * Visit Date: 06/06/2021 Occupation: @GUAROCC @  Subjective:  Medication management.   History of Present Illness: Sherry Jacobs is a 55 y.o. female with a history of autoimmune disease.  She states she has been doing well on hydroxychloroquine 200 mg 1 tablet every other day.  She denies any sicca symptoms, oral ulcers, Raynaud's phenomenon, inflammatory arthritis, malar rash or fatigue.  Her eye exam for ocular toxicity monitoring is coming up this month.  She was diagnosed with hyperlipidemia and was given a prescription for Crestor which she has not started yet.  She is trying a new diet and also has joined an exercise group.  Activities of Daily Living:  Patient reports morning stiffness for 0 minutes.   Patient Denies nocturnal pain.  Difficulty dressing/grooming: Denies Difficulty climbing stairs: Denies Difficulty getting out of chair: Denies Difficulty using hands for taps, buttons, cutlery, and/or writing: Denies  Review of Systems  Constitutional:  Negative for fatigue.  HENT:  Negative for mouth sores, mouth dryness and nose dryness.   Eyes:  Negative for pain, itching, visual disturbance and dryness.  Respiratory:  Negative for cough, hemoptysis, shortness of breath and difficulty breathing.   Cardiovascular:  Negative for chest pain, palpitations and swelling in legs/feet.  Gastrointestinal:  Negative for abdominal pain, blood in stool, constipation and diarrhea.  Endocrine: Negative for increased urination.  Genitourinary:  Negative for painful urination.  Musculoskeletal:  Positive for muscle tenderness. Negative for joint pain, joint pain, joint swelling, myalgias, muscle weakness, morning stiffness and myalgias.  Skin:  Negative for color change, rash and redness.   Allergic/Immunologic: Negative for susceptible to infections.  Neurological:  Negative for dizziness, numbness, headaches, memory loss and weakness.  Hematological:  Negative for swollen glands.  Psychiatric/Behavioral:  Positive for sleep disturbance. Negative for confusion.    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   LEG PAIN, LEFT 10/28/2008   ANXIETY 06/23/2008   UNSPECIFIED OSTEOPOROSIS 12/29/2007   HYPERLIPIDEMIA 10/02/2007   INJURY, 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 09/09/2007   Palpitations 09/09/2007   ANA POSITIVE, HX OF 09/09/2007   IRRITABLE BOWEL SYNDROME 04/11/2007   FIBROMYALGIA 04/11/2007    Past Medical History:  Diagnosis Date   ANA positive    Arthralgia    Autoimmune disease (HCC)    ANA positive   Endometrial polyp    IBS (irritable bowel syndrome)    Iron deficiency anemia    Lactose intolerance in adult    PONV (postoperative nausea and vomiting)    Social anxiety disorder    Wears contact lenses     Family History  Problem Relation Age of Onset   Breast cancer Mother    Colon polyps Father    Diabetes Unknown    Hypertension Unknown    Alcohol abuse Unknown    Hyperlipidemia Unknown    Arthritis Unknown  Stroke Unknown    Thyroid disease Unknown    Uterine cancer Unknown    Breast cancer Paternal Grandmother    Melanoma Maternal Grandfather    Asthma Daughter        exercise induced    Asthma Son        exercise induced    Past Surgical History:  Procedure Laterality Date   APPENDECTOMY  1981   DILATATION & CURETTAGE/HYSTEROSCOPY WITH MYOSURE N/A 10/28/2014   Procedure: DILATATION & CURETTAGE/HYSTEROSCOPY WITH MYOSURE;  Surgeon:  Marylynn Pearson, MD;  Location: [facility];  Service: Gynecology;  Laterality: N/A;   RIGHT MODIFIED NECK DISSECTION W/ REMOVAL BENIGN RIGHT PARAPHARYNGEAL SPACE MASS AND LYMPH NODES  08-02-2004   benign bronchogenic cysts / negative lymph nodes   Social History   Social History Narrative   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   Living at home with husband and two kids                        Immunization History  Administered Date(s) Administered   H1N1 10/13/2008   Influenza Whole 11/26/2004, 09/15/2008, 08/30/2010   Influenza-Unspecified 08/27/2011, 08/26/2016, 09/05/2019, 09/04/2020   PFIZER(Purple Top)SARS-COV-2 Vaccination 11/17/2019, 12/08/2019   Pneumococcal Polysaccharide-23 05/11/2011   Td 12/15/2008   Zoster Recombinat (Shingrix) 06/06/2021     Objective: Vital Signs: BP 110/75 (BP Location: Left Arm, Patient Position: Sitting, Cuff Size: Normal)   Pulse 84   Ht 5\' 7"  (1.702 m)   Wt 200 lb 12.8 oz (91.1 kg)   LMP 04/07/2017   BMI 31.45 kg/m    Physical Exam Vitals and nursing note reviewed.  Constitutional:      Appearance: She is well-developed.  HENT:     Head: Normocephalic and atraumatic.  Eyes:     Conjunctiva/sclera: Conjunctivae normal.  Cardiovascular:     Rate and Rhythm: Normal rate and regular rhythm.     Heart sounds: Normal heart sounds.  Pulmonary:     Effort: Pulmonary effort is normal.     Breath sounds: Normal breath sounds.  Abdominal:     General: Bowel sounds are normal.     Palpations: Abdomen is soft.  Musculoskeletal:     Cervical back: Normal range of motion.  Lymphadenopathy:     Cervical: No cervical adenopathy.  Skin:    General: Skin is warm and dry.     Capillary Refill: Capillary refill takes less than 2 seconds.  Neurological:     Mental Status: She is alert and oriented to person, place, and time.  Psychiatric:        Behavior: Behavior normal.     Musculoskeletal Exam: C-spine was in good range  of motion.  Shoulder joints, elbow joints, wrist joints, MCPs PIPs and DIPs with good range of motion with no synovitis.  Hip joints, knee joints, ankles, MTPs and PIPs with good range of motion with no synovitis.  CDAI Exam: CDAI Score: -- Patient Global: --; Provider Global: -- Swollen: --; Tender: -- Joint Exam 06/06/2021   No joint exam has been documented for this visit   There is currently no information documented on the homunculus. Go to the Rheumatology activity and complete the homunculus joint exam.  Investigation: No additional findings.  Imaging: No results found.  Recent Labs: Lab Results  Component Value Date   WBC 4.8 06/05/2021   HGB 13.7 06/05/2021   PLT 247.0 06/05/2021   NA 140 06/05/2021   K 4.0  06/05/2021   CL 103 06/05/2021   CO2 26 06/05/2021   GLUCOSE 92 06/05/2021   BUN 18 06/05/2021   CREATININE 0.90 06/05/2021   BILITOT 0.6 06/05/2021   ALKPHOS 67 06/05/2021   AST 20 06/05/2021   ALT 24 06/05/2021   PROT 6.9 06/05/2021   ALBUMIN 4.5 06/05/2021   CALCIUM 9.8 06/05/2021   GFRAA 88 01/10/2021    Speciality Comments: PLQ EYe Exam: 07/13/2020 WNL @ [facility].  Procedures:  No procedures performed Allergies: Sulfa antibiotics   Assessment / Plan:     Visit Diagnoses: Autoimmune disease ([place]) - +ANA, low C3, arthralgia, fatigue, Sicca symptoms, and Raynaud's: She is currently asymptomatic.  Her labs from February 2022 showed normal complements and sed rate.  High risk medication use - Plaquenil 200 mg 1 tablet by mouth every other day. PLQ Eye Exam: 07/13/2020.  She is scheduled to have eye examination in the coming week.  Raynaud's disease without gangrene-currently asymptomatic.  Sicca syndrome (HCC)--She denies any sicca symptoms currently.  Vitamin D deficiency-she is taking multivitamin now and to stop vitamin D.  Have advised her to take vitamin D 2000 units daily.  Trochanteric bursitis, bilateral-she has off-and-on  discomfort in the trochanteric area.  IT band stretches were emphasized.  Fibromyalgia-she continues to have some generalized pain and discomfort.  Other fatigue-fatigue is better.  History of IBS  History of hyperlipidemia-her LDL has increased.  Dietary modifications were discussed.  She will be started Crestor.  History of anxiety  Orders: No orders of the defined types were placed in this encounter.  No orders of the defined types were placed in this encounter.    Follow-Up Instructions: Return in about 5 months (around 11/06/2021) for Autoimmune disease.   Bo Merino, MD  Note - This record has been created using Editor, commissioning.  Chart creation errors have been sought, but may not always  have been located. Such creation errors do not reflect on  the standard of medical care.

## 2021-05-25 NOTE — Progress Notes (Signed)
Subjective:     Sherry Jacobs is a 55 y.o. female and is here for a comprehensive physical exam. The patient reports no problems.    Health Maintenance  Topic Date Due   Hepatitis C Screening  Never done   Zoster Vaccines- Shingrix (1 of 2) Never done   COLONOSCOPY (Pts 45-75yrs Insurance coverage will need to be confirmed)  Never done   TETANUS/TDAP  12/15/2018   COVID-19 Vaccine (3 - Pfizer risk series) 01/05/2020   MAMMOGRAM  12/06/2020   INFLUENZA VACCINE  06/26/2021   PAP SMEAR-Modifier  07/01/2023   HIV Screening  Completed   HPV VACCINES  Aged Out   Pneumococcal Vaccine 33-5 Years old  Discontinued    The following portions of the patient's history were reviewed and updated as appropriate: She  has a past medical history of ANA positive, Arthralgia, Autoimmune disease ([place]), Endometrial polyp, IBS (irritable bowel syndrome), Iron deficiency anemia, Lactose intolerance in adult, PONV (postoperative nausea and vomiting), Social anxiety disorder, and Wears contact lenses. She does not have any pertinent problems on file. She  has a past surgical history that includes Appendectomy (1981); RIGHT MODIFIED NECK DISSECTION W/ REMOVAL BENIGN RIGHT PARAPHARYNGEAL SPACE MASS AND LYMPH NODES (08-02-2004); and Dilatation & curettage/hysteroscopy with myosure (N/A, 10/28/2014). Her family history includes Alcohol abuse in her unknown relative; Arthritis in her unknown relative; Asthma in her daughter and son; Breast cancer in her mother and paternal grandmother; Colon polyps in her father; Diabetes in her unknown relative; Hyperlipidemia in her unknown relative; Hypertension in her unknown relative; Melanoma in her maternal grandfather; Stroke in her unknown relative; Thyroid disease in her unknown relative; Uterine cancer in her unknown relative. She  reports that she has never smoked. She has never used smokeless tobacco. She reports current alcohol use. She reports that she does not use  drugs. She has a current medication list which includes the following prescription(s): alprazolam, fluticasone, hydroxychloroquine, multivitamin, propranolol, and tretinoin. Current Outpatient Medications on File Prior to Visit  Medication Sig Dispense Refill   ALPRAZolam (XANAX) 0.25 MG tablet Take 0.25 mg by mouth as needed for anxiety.      fluticasone (CUTIVATE) 0.005 % ointment APPLY TO THE AFFECTED AREA(S) 2 TIMES DAILY 45 g 0   hydroxychloroquine (PLAQUENIL) 200 MG tablet TAKE 1 TABLET BY MOUTH EVERY OTHER DAY. 45 tablet 0   Multiple Vitamin (MULTIVITAMIN) tablet Take 1 tablet by mouth daily.     propranolol (INDERAL) 20 MG tablet Take 20 mg by mouth as needed.     tretinoin (RETIN-A) 0.025 % cream APPLY TO AFFECTED AREA OF FACE IN THE EVENING FOR COSMETIC PURPOSES 45 g 3   No current facility-administered medications on file prior to visit.   She is allergic to sulfa antibiotics..  Review of Systems Review of Systems  Constitutional: Negative for activity change, appetite change and fatigue.  HENT: Negative for hearing loss, congestion, tinnitus and ear discharge.  dentist q80m Eyes: Negative for visual disturbance (see optho q1y -- vision corrected to 20/20 with glasses).  Respiratory: Negative for cough, chest tightness and shortness of breath.   Cardiovascular: Negative for chest pain, palpitations and leg swelling.  Gastrointestinal: Negative for abdominal pain, diarrhea, constipation and abdominal distention.  Genitourinary: Negative for urgency, frequency, decreased urine volume and difficulty urinating.  Musculoskeletal: Negative for back pain, arthralgias and gait problem.  Skin: Negative for color change, pallor and rash.  Neurological: Negative for dizziness, light-headedness, numbness and headaches.  Hematological: Negative for adenopathy.  Does not bruise/bleed easily.  Psychiatric/Behavioral: Negative for suicidal ideas, confusion, sleep disturbance, self-injury,  dysphoric mood, decreased concentration and agitation.      Objective:    BP 112/82 (BP Location: Right Arm, Patient Position: Sitting, Cuff Size: Normal)   Pulse 91   Temp 98.8 F (37.1 C) (Oral)   Resp 18   Ht 5\' 7"  (1.702 m)   Wt 199 lb 12.8 oz (90.6 kg)   LMP 04/07/2017   SpO2 96%   BMI 31.29 kg/m  General appearance: alert, cooperative, appears stated age, and no distress Head: Normocephalic, without obvious abnormality, atraumatic Eyes: conjunctivae/corneas clear. PERRL, EOM's intact. Fundi benign. Ears: normal TM's and external ear canals both ears Neck: no adenopathy, no carotid bruit, no JVD, supple, symmetrical, trachea midline, and thyroid not enlarged, symmetric, no tenderness/mass/nodules Back: symmetric, no curvature. ROM normal. No CVA tenderness. Lungs: clear to auscultation bilaterally Breasts: gyn Heart: regular rate and rhythm, S1, S2 normal, no murmur, click, rub or gallop Abdomen: soft, non-tender; bowel sounds normal; no masses,  no organomegaly Pelvic: deferred--gyn Extremities: extremities normal, atraumatic, no cyanosis or edema Pulses: 2+ and symmetric Skin: Skin color, texture, turgor normal. No rashes or lesions Lymph nodes: Cervical, supraclavicular, and axillary nodes normal. Neurologic: Alert and oriented X 3, normal strength and tone. Normal symmetric reflexes. Normal coordination and gait    Assessment:    Healthy female exam.      Plan:    Ghm utd Check labs  See After Visit Summary for Counseling Recommendations   1. Preventative health care See above  - CBC with Differential/Platelet; Future - Comprehensive metabolic panel; Future - Lipid panel; Future - TSH; Future  2. Colon cancer screening  - Ambulatory referral to Gastroenterology  3. Need for hepatitis C screening test  - Hepatitis C antibody; Future

## 2021-05-31 ENCOUNTER — Other Ambulatory Visit: Payer: 59

## 2021-05-31 ENCOUNTER — Ambulatory Visit: Payer: 59

## 2021-06-05 ENCOUNTER — Other Ambulatory Visit: Payer: Self-pay | Admitting: Family Medicine

## 2021-06-05 ENCOUNTER — Other Ambulatory Visit (INDEPENDENT_AMBULATORY_CARE_PROVIDER_SITE_OTHER): Payer: 59

## 2021-06-05 ENCOUNTER — Other Ambulatory Visit: Payer: Self-pay

## 2021-06-05 DIAGNOSIS — Z0000 Encounter for general adult medical examination without abnormal findings: Secondary | ICD-10-CM

## 2021-06-05 DIAGNOSIS — E785 Hyperlipidemia, unspecified: Secondary | ICD-10-CM

## 2021-06-05 DIAGNOSIS — Z1159 Encounter for screening for other viral diseases: Secondary | ICD-10-CM

## 2021-06-05 LAB — COMPREHENSIVE METABOLIC PANEL
ALT: 24 U/L (ref 0–35)
AST: 20 U/L (ref 0–37)
Albumin: 4.5 g/dL (ref 3.5–5.2)
Alkaline Phosphatase: 67 U/L (ref 39–117)
BUN: 18 mg/dL (ref 6–23)
CO2: 26 mEq/L (ref 19–32)
Calcium: 9.8 mg/dL (ref 8.4–10.5)
Chloride: 103 mEq/L (ref 96–112)
Creatinine, Ser: 0.9 mg/dL (ref 0.40–1.20)
GFR: 71.55 mL/min (ref >60.00)
Glucose, Bld: 92 mg/dL (ref 70–99)
Potassium: 4 mEq/L (ref 3.5–5.1)
Sodium: 140 mEq/L (ref 135–145)
Total Bilirubin: 0.6 mg/dL (ref 0.2–1.2)
Total Protein: 6.9 g/dL (ref 6.0–8.3)

## 2021-06-05 LAB — CBC WITH DIFFERENTIAL/PLATELET
Basophils Absolute: 0 10*3/uL (ref 0.0–0.1)
Basophils Relative: 0.7 % (ref 0.0–3.0)
Eosinophils Absolute: 0.3 10*3/uL (ref 0.0–0.7)
Eosinophils Relative: 5.5 % — ABNORMAL HIGH (ref 0.0–5.0)
HCT: 40.5 % (ref 36.0–46.0)
Hemoglobin: 13.7 g/dL (ref 12.0–15.0)
Lymphocytes Relative: 35 % (ref 12.0–46.0)
Lymphs Abs: 1.7 10*3/uL (ref 0.7–4.0)
MCHC: 33.9 g/dL (ref 30.0–36.0)
MCV: 84 fl (ref 78.0–100.0)
Monocytes Absolute: 0.5 10*3/uL (ref 0.1–1.0)
Monocytes Relative: 9.9 % (ref 3.0–12.0)
Neutro Abs: 2.3 10*3/uL (ref 1.4–7.7)
Neutrophils Relative %: 48.9 % (ref 43.0–77.0)
Platelets: 247 10*3/uL (ref 150.0–400.0)
RBC: 4.82 Mil/uL (ref 3.87–5.11)
RDW: 14.1 % (ref 11.5–15.5)
WBC: 4.8 10*3/uL (ref 4.0–10.5)

## 2021-06-05 LAB — LIPID PANEL
Cholesterol: 260 mg/dL — ABNORMAL HIGH (ref 0–200)
HDL: 50.6 mg/dL (ref >39.00)
LDL Cholesterol: 187 mg/dL — ABNORMAL HIGH (ref 0–99)
NonHDL: 209.01
Total CHOL/HDL Ratio: 5
Triglycerides: 112 mg/dL (ref 0.0–149.0)
VLDL: 22.4 mg/dL (ref 0.0–40.0)

## 2021-06-05 LAB — TSH: TSH: 4.85 u[IU]/mL (ref 0.35–5.50)

## 2021-06-06 ENCOUNTER — Other Ambulatory Visit: Payer: Self-pay

## 2021-06-06 ENCOUNTER — Ambulatory Visit (INDEPENDENT_AMBULATORY_CARE_PROVIDER_SITE_OTHER): Payer: 59 | Admitting: Rheumatology

## 2021-06-06 ENCOUNTER — Encounter: Payer: Self-pay | Admitting: Rheumatology

## 2021-06-06 ENCOUNTER — Other Ambulatory Visit (HOSPITAL_BASED_OUTPATIENT_CLINIC_OR_DEPARTMENT_OTHER): Payer: Self-pay

## 2021-06-06 ENCOUNTER — Ambulatory Visit (INDEPENDENT_AMBULATORY_CARE_PROVIDER_SITE_OTHER): Payer: 59

## 2021-06-06 VITALS — BP 110/75 | HR 84 | Ht 67.0 in | Wt 200.8 lb

## 2021-06-06 DIAGNOSIS — Z79899 Other long term (current) drug therapy: Secondary | ICD-10-CM

## 2021-06-06 DIAGNOSIS — M7062 Trochanteric bursitis, left hip: Secondary | ICD-10-CM

## 2021-06-06 DIAGNOSIS — I7300 Raynaud's syndrome without gangrene: Secondary | ICD-10-CM | POA: Diagnosis not present

## 2021-06-06 DIAGNOSIS — R5383 Other fatigue: Secondary | ICD-10-CM | POA: Diagnosis not present

## 2021-06-06 DIAGNOSIS — M359 Systemic involvement of connective tissue, unspecified: Secondary | ICD-10-CM | POA: Diagnosis not present

## 2021-06-06 DIAGNOSIS — Z8719 Personal history of other diseases of the digestive system: Secondary | ICD-10-CM

## 2021-06-06 DIAGNOSIS — M3500 Sicca syndrome, unspecified: Secondary | ICD-10-CM | POA: Diagnosis not present

## 2021-06-06 DIAGNOSIS — E559 Vitamin D deficiency, unspecified: Secondary | ICD-10-CM

## 2021-06-06 DIAGNOSIS — Z23 Encounter for immunization: Secondary | ICD-10-CM

## 2021-06-06 DIAGNOSIS — Z8639 Personal history of other endocrine, nutritional and metabolic disease: Secondary | ICD-10-CM

## 2021-06-06 DIAGNOSIS — M797 Fibromyalgia: Secondary | ICD-10-CM | POA: Diagnosis not present

## 2021-06-06 DIAGNOSIS — M7061 Trochanteric bursitis, right hip: Secondary | ICD-10-CM

## 2021-06-06 DIAGNOSIS — Z8659 Personal history of other mental and behavioral disorders: Secondary | ICD-10-CM

## 2021-06-06 LAB — HEPATITIS C ANTIBODY
Hepatitis C Ab: NONREACTIVE
SIGNAL TO CUT-OFF: 0 (ref <1.00)

## 2021-06-06 MED ORDER — ROSUVASTATIN CALCIUM 10 MG PO TABS
10.0000 mg | ORAL_TABLET | Freq: Every day | ORAL | 2 refills | Status: DC
  Filled 2021-06-06 – 2021-06-28 (×3): qty 30, 30d supply, fill #0
  Filled 2021-07-10: qty 90, 90d supply, fill #0

## 2021-06-06 NOTE — Progress Notes (Signed)
Sherry Jacobs is a 55 y.o. female presents to the office today for shingrix vaccine, per physician's orders.  shingrix (med), 0.39mL (dose),  IM (route) was administered left deltoid (location) today. Patient tolerated injection.  Patient next injection due: 2-6 months, appt made No Patient states she will call.   Sherry Jacobs

## 2021-06-09 ENCOUNTER — Other Ambulatory Visit (HOSPITAL_BASED_OUTPATIENT_CLINIC_OR_DEPARTMENT_OTHER): Payer: Self-pay

## 2021-06-14 DIAGNOSIS — H5203 Hypermetropia, bilateral: Secondary | ICD-10-CM | POA: Diagnosis not present

## 2021-06-14 DIAGNOSIS — Z79899 Other long term (current) drug therapy: Secondary | ICD-10-CM | POA: Diagnosis not present

## 2021-06-14 DIAGNOSIS — H04123 Dry eye syndrome of bilateral lacrimal glands: Secondary | ICD-10-CM | POA: Diagnosis not present

## 2021-06-14 DIAGNOSIS — H40033 Anatomical narrow angle, bilateral: Secondary | ICD-10-CM | POA: Diagnosis not present

## 2021-06-16 ENCOUNTER — Other Ambulatory Visit (HOSPITAL_BASED_OUTPATIENT_CLINIC_OR_DEPARTMENT_OTHER): Payer: Self-pay

## 2021-06-23 ENCOUNTER — Other Ambulatory Visit (HOSPITAL_BASED_OUTPATIENT_CLINIC_OR_DEPARTMENT_OTHER): Payer: Self-pay

## 2021-06-28 ENCOUNTER — Other Ambulatory Visit (HOSPITAL_COMMUNITY): Payer: Self-pay

## 2021-06-28 ENCOUNTER — Other Ambulatory Visit (HOSPITAL_BASED_OUTPATIENT_CLINIC_OR_DEPARTMENT_OTHER): Payer: Self-pay

## 2021-07-10 ENCOUNTER — Other Ambulatory Visit (HOSPITAL_BASED_OUTPATIENT_CLINIC_OR_DEPARTMENT_OTHER): Payer: Self-pay

## 2021-07-10 MED ORDER — CARESTART COVID-19 HOME TEST VI KIT
PACK | 0 refills | Status: DC
  Filled 2021-07-10: qty 4, 4d supply, fill #0

## 2021-08-07 NOTE — Progress Notes (Signed)
Sherry Jacobs is a 55 y.o. female presents to the office today for Zoster: Recombinat (Shingles) dose 2 injection, per physician's orders. Original order: 06/06/2021- Dr Etter Sjogren Zoster: Recombinat (Shingles) 0.5 mL IM was administered L deltoid today. Patient tolerated injection. Patient due for follow up labs/provider appt: No. Date due: next appointment is already pending.   Creft, Darlis Loan

## 2021-08-08 ENCOUNTER — Other Ambulatory Visit: Payer: Self-pay

## 2021-08-08 ENCOUNTER — Ambulatory Visit (INDEPENDENT_AMBULATORY_CARE_PROVIDER_SITE_OTHER): Payer: 59

## 2021-08-08 DIAGNOSIS — Z23 Encounter for immunization: Secondary | ICD-10-CM

## 2021-08-16 DIAGNOSIS — Z6830 Body mass index (BMI) 30.0-30.9, adult: Secondary | ICD-10-CM | POA: Diagnosis not present

## 2021-08-16 DIAGNOSIS — Z01419 Encounter for gynecological examination (general) (routine) without abnormal findings: Secondary | ICD-10-CM | POA: Diagnosis not present

## 2021-08-22 DIAGNOSIS — Z13820 Encounter for screening for osteoporosis: Secondary | ICD-10-CM | POA: Diagnosis not present

## 2021-08-22 LAB — HM DEXA SCAN: HM Dexa Scan: NORMAL

## 2021-09-06 ENCOUNTER — Other Ambulatory Visit: Payer: 59

## 2021-09-21 ENCOUNTER — Encounter: Payer: Self-pay | Admitting: Family Medicine

## 2021-09-21 ENCOUNTER — Other Ambulatory Visit: Payer: Self-pay

## 2021-09-21 ENCOUNTER — Other Ambulatory Visit (INDEPENDENT_AMBULATORY_CARE_PROVIDER_SITE_OTHER): Payer: 59

## 2021-09-21 DIAGNOSIS — E785 Hyperlipidemia, unspecified: Secondary | ICD-10-CM | POA: Diagnosis not present

## 2021-09-21 LAB — COMPREHENSIVE METABOLIC PANEL
ALT: 24 U/L (ref 0–35)
AST: 21 U/L (ref 0–37)
Albumin: 4.8 g/dL (ref 3.5–5.2)
Alkaline Phosphatase: 69 U/L (ref 39–117)
BUN: 19 mg/dL (ref 6–23)
CO2: 30 mEq/L (ref 19–32)
Calcium: 9.8 mg/dL (ref 8.4–10.5)
Chloride: 102 mEq/L (ref 96–112)
Creatinine, Ser: 0.96 mg/dL (ref 0.40–1.20)
GFR: 66.08 mL/min (ref >60.00)
Glucose, Bld: 89 mg/dL (ref 70–99)
Potassium: 4.1 mEq/L (ref 3.5–5.1)
Sodium: 141 mEq/L (ref 135–145)
Total Bilirubin: 0.5 mg/dL (ref 0.2–1.2)
Total Protein: 7.4 g/dL (ref 6.0–8.3)

## 2021-09-21 LAB — LIPID PANEL
Cholesterol: 165 mg/dL (ref 0–200)
HDL: 55.6 mg/dL (ref >39.00)
LDL Cholesterol: 97 mg/dL (ref 0–99)
NonHDL: 109.68
Total CHOL/HDL Ratio: 3
Triglycerides: 65 mg/dL (ref 0.0–149.0)
VLDL: 13 mg/dL (ref 0.0–40.0)

## 2021-09-22 ENCOUNTER — Other Ambulatory Visit: Payer: Self-pay | Admitting: Family Medicine

## 2021-09-22 DIAGNOSIS — E785 Hyperlipidemia, unspecified: Secondary | ICD-10-CM

## 2021-09-22 NOTE — Telephone Encounter (Signed)
Okay to place these future orders?

## 2021-09-27 ENCOUNTER — Other Ambulatory Visit (HOSPITAL_BASED_OUTPATIENT_CLINIC_OR_DEPARTMENT_OTHER): Payer: Self-pay

## 2021-09-27 DIAGNOSIS — D2221 Melanocytic nevi of right ear and external auricular canal: Secondary | ICD-10-CM | POA: Diagnosis not present

## 2021-09-27 DIAGNOSIS — Z23 Encounter for immunization: Secondary | ICD-10-CM | POA: Diagnosis not present

## 2021-09-27 DIAGNOSIS — Z86018 Personal history of other benign neoplasm: Secondary | ICD-10-CM | POA: Diagnosis not present

## 2021-09-27 DIAGNOSIS — D225 Melanocytic nevi of trunk: Secondary | ICD-10-CM | POA: Diagnosis not present

## 2021-09-27 DIAGNOSIS — D2230 Melanocytic nevi of unspecified part of face: Secondary | ICD-10-CM | POA: Diagnosis not present

## 2021-09-27 DIAGNOSIS — L578 Other skin changes due to chronic exposure to nonionizing radiation: Secondary | ICD-10-CM | POA: Diagnosis not present

## 2021-09-27 DIAGNOSIS — L821 Other seborrheic keratosis: Secondary | ICD-10-CM | POA: Diagnosis not present

## 2021-09-27 DIAGNOSIS — L814 Other melanin hyperpigmentation: Secondary | ICD-10-CM | POA: Diagnosis not present

## 2021-09-27 MED ORDER — CARESTART COVID-19 HOME TEST VI KIT
PACK | 0 refills | Status: DC
  Filled 2021-09-27: qty 2, 4d supply, fill #0

## 2021-10-16 ENCOUNTER — Other Ambulatory Visit (HOSPITAL_BASED_OUTPATIENT_CLINIC_OR_DEPARTMENT_OTHER): Payer: Self-pay

## 2021-10-16 ENCOUNTER — Other Ambulatory Visit: Payer: Self-pay | Admitting: Family Medicine

## 2021-10-16 MED ORDER — ROSUVASTATIN CALCIUM 10 MG PO TABS
10.0000 mg | ORAL_TABLET | Freq: Every day | ORAL | 2 refills | Status: DC
  Filled 2021-10-16: qty 30, 30d supply, fill #0
  Filled 2021-11-21: qty 30, 30d supply, fill #1
  Filled 2022-01-02: qty 30, 30d supply, fill #2

## 2021-10-25 ENCOUNTER — Encounter: Payer: Self-pay | Admitting: Gastroenterology

## 2021-10-25 NOTE — Progress Notes (Signed)
Office Visit Note  Patient: Sherry Jacobs             Date of Birth: 13-Nov-1966           MRN: [id number]             PCP: Ann Held, DO Referring: Ann Held, * Visit Date: 11/07/2021 Occupation: @GUAROCC @  Subjective:  Medication management.   History of Present Illness: Sherry Jacobs is a 55 y.o. female with a history of autoimmune disease, osteoarthritis and fibromyalgia syndrome.  She states that she has been going for Pilates for the last 6 months which has been helpful.  She is been experiencing some discomfort in the trochanteric area.  She also has some discomfort in her knee joints after the Pilates class but it resolves quickly.  She has stiffness in her hands.  Joint pain has been well controlled on hydroxychloroquine.  She denies any sicca symptoms currently.  Raynauds is not active.  Activities of Daily Living:  Patient reports morning stiffness for 0 minutes.   Patient Denies nocturnal pain.  Difficulty dressing/grooming: Denies Difficulty climbing stairs: Denies Difficulty getting out of chair: Denies Difficulty using hands for taps, buttons, cutlery, and/or writing: Denies  Review of Systems  Constitutional:  Negative for fatigue.  HENT:  Negative for mouth sores, mouth dryness and nose dryness.   Eyes:  Negative for pain, itching and dryness.  Respiratory:  Negative for shortness of breath and difficulty breathing.   Cardiovascular:  Negative for chest pain and palpitations.  Gastrointestinal:  Negative for blood in stool, constipation and diarrhea.  Endocrine: Negative for increased urination.  Genitourinary:  Negative for difficulty urinating.  Musculoskeletal:  Positive for joint pain, joint pain, myalgias and myalgias. Negative for joint swelling, morning stiffness and muscle tenderness.  Skin:  Negative for color change, rash and redness.  Allergic/Immunologic: Negative for susceptible to infections.  Neurological:  Negative  for dizziness, numbness, headaches, memory loss and weakness.  Hematological:  Negative for bruising/bleeding tendency.  Psychiatric/Behavioral:  Negative for confusion.    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   LEG PAIN, LEFT 10/28/2008   ANXIETY 06/23/2008   UNSPECIFIED OSTEOPOROSIS 12/29/2007   HYPERLIPIDEMIA 10/02/2007   INJURY, 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 09/09/2007   Palpitations 09/09/2007   ANA POSITIVE, HX OF 09/09/2007   IRRITABLE BOWEL SYNDROME 04/11/2007   FIBROMYALGIA 04/11/2007    Past Medical History:  Diagnosis Date   ANA positive    Arthralgia    Autoimmune disease ([place])    ANA positive   Endometrial polyp    IBS (irritable bowel syndrome)    Iron deficiency anemia    Lactose intolerance in adult    PONV (postoperative nausea and vomiting)    Social anxiety disorder    Wears contact lenses     Family History  Problem Relation Age of Onset   Breast cancer Mother    Colon polyps Father    Diabetes Unknown    Hypertension Unknown    Alcohol abuse Unknown    Hyperlipidemia Unknown    Arthritis Unknown    Stroke Unknown    Thyroid disease Unknown  Uterine cancer Unknown    Breast cancer Paternal Grandmother    Melanoma Maternal Grandfather    Asthma Daughter        exercise induced    Asthma Son        exercise induced    Past Surgical History:  Procedure Laterality Date   APPENDECTOMY  1981   DILATATION & CURETTAGE/HYSTEROSCOPY WITH MYOSURE N/A 10/28/2014   Procedure: DILATATION & CURETTAGE/HYSTEROSCOPY WITH MYOSURE;  Surgeon: Marylynn Pearson, MD;  Location: [facility];  Service: Gynecology;  Laterality: N/A;   RIGHT  MODIFIED NECK DISSECTION W/ REMOVAL BENIGN RIGHT PARAPHARYNGEAL SPACE MASS AND LYMPH NODES  08-02-2004   benign bronchogenic cysts / negative lymph nodes   Social History   Social History Narrative   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   Living at home with husband and two kids                        Immunization History  Administered Date(s) Administered   H1N1 10/13/2008   Influenza Whole 11/26/2004, 09/15/2008, 08/30/2010   Influenza-Unspecified 08/27/2011, 08/26/2016, 09/05/2019, 09/04/2020   PFIZER(Purple Top)SARS-COV-2 Vaccination 11/17/2019, 12/08/2019   Pneumococcal Polysaccharide-23 05/11/2011   Td 12/15/2008   Zoster Recombinat (Shingrix) 06/06/2021, 08/08/2021     Objective: Vital Signs: BP 121/82 (BP Location: Right Arm, Patient Position: Sitting, Cuff Size: Normal)   Pulse 79   Ht 5\' 7"  (1.702 m)   Wt 197 lb (89.4 kg)   LMP 04/07/2017   BMI 30.85 kg/m    Physical Exam Vitals and nursing note reviewed.  Constitutional:      Appearance: She is well-developed.  HENT:     Head: Normocephalic and atraumatic.  Eyes:     Conjunctiva/sclera: Conjunctivae normal.  Cardiovascular:     Rate and Rhythm: Normal rate and regular rhythm.     Heart sounds: Normal heart sounds.  Pulmonary:     Effort: Pulmonary effort is normal.     Breath sounds: Normal breath sounds.  Abdominal:     General: Bowel sounds are normal.     Palpations: Abdomen is soft.  Musculoskeletal:     Cervical back: Normal range of motion.  Lymphadenopathy:     Cervical: No cervical adenopathy.  Skin:    General: Skin is warm and dry.     Capillary Refill: Capillary refill takes less than 2 seconds.  Neurological:     Mental Status: She is alert and oriented to person, place, and time.  Psychiatric:        Behavior: Behavior normal.     Musculoskeletal Exam: C-spine was in good range of motion.  Shoulder joints, elbow joints, wrist joints, MCPs PIPs and DIPs with good range of motion.   She had mild DIP thickening bilaterally.  Hip joints and knee joints with good range of motion.  She had tenderness over bilateral trochanteric bursa.  There was no tenderness over ankles or MTPs.  No synovitis was noted.  CDAI Exam: CDAI Score: -- Patient Global: --; Provider Global: -- Swollen: --; Tender: -- Joint Exam 11/07/2021   No joint exam has been documented for this visit   There is currently no information documented on the homunculus. Go to the Rheumatology activity and complete the homunculus joint exam.  Investigation: No additional findings.  Imaging: No results found.  Recent Labs: Lab Results  Component Value Date   WBC 4.8 06/05/2021   HGB 13.7 06/05/2021   PLT 247.0 06/05/2021  NA 141 09/21/2021   K 4.1 09/21/2021   CL 102 09/21/2021   CO2 30 09/21/2021   GLUCOSE 89 09/21/2021   BUN 19 09/21/2021   CREATININE 0.96 09/21/2021   BILITOT 0.5 09/21/2021   ALKPHOS 69 09/21/2021   AST 21 09/21/2021   ALT 24 09/21/2021   PROT 7.4 09/21/2021   ALBUMIN 4.8 09/21/2021   CALCIUM 9.8 09/21/2021   GFRAA 88 01/10/2021    Speciality Comments: PLQ EYe Exam: 10/27/2021 WNL @ [facility],   Procedures:  No procedures performed Allergies: Sulfa antibiotics   Assessment / Plan:     Visit Diagnoses: Autoimmune disease ([place]) - +ANA, low C3, arthralgia, fatigue, Sicca symptoms, and Raynaud's: -She denies any joint swelling, sicca symptoms or Raynaud's phenomenon.  She is clinically doing well on low-dose hydroxychloroquine.  Will obtain labs today.  Plan: hydroxychloroquine (PLAQUENIL) 200 MG tablet, Urinalysis, Routine w reflex microscopic, Anti-DNA antibody, double-stranded, C3 and C4, Sedimentation rate, ANA  High risk medication use - Plaquenil 200 mg 1 tablet by mouth every other day. PLQ Eye Exam: 07/13/2020.   - Plan: CBC with Differential/Platelet  Raynaud's disease without gangrene-currently not active.  Precautions during the cold weather was  discussed.  Sicca syndrome (H[facility])-over-the-counter products have been helpful.  Primary osteoarthritis of both hands-she had bilateral DIP thickening.  She experienced some stiffness in her hands.  Joint protection muscle strengthening was discussed.  A handout on exercises was given.    Vitamin D deficiency-vitamin D was normal on January 10, 2021.  She has been taking multivitamin with vitamin D.  Trochanteric bursitis of both hips-she had tenderness over bilateral trochanteric bursa.  I given stretches were demonstrated and a handout was given.  Fibromyalgia-she continues to have some generalized pain and discomfort.  Stretching exercises were emphasized.  Regular aerobic exercise was also emphasized.  Other fatigue-related to fibromyalgia.  History of IBS  History of anxiety  History of hyperlipidemia  Orders: Orders Placed This Encounter  Procedures   CBC with Differential/Platelet   Urinalysis, Routine w reflex microscopic   Anti-DNA antibody, double-stranded   C3 and C4   Sedimentation rate   ANA    Meds ordered this encounter  Medications   hydroxychloroquine (PLAQUENIL) 200 MG tablet    Sig: TAKE 1 TABLET BY MOUTH EVERY OTHER DAY.    Dispense:  45 tablet    Refill:  0      Follow-Up Instructions: Return in about 5 months (around 04/07/2022) for Autoimmune disease.   Bo Merino, MD  Note - This record has been created using Editor, commissioning.  Chart creation errors have been sought, but may not always  have been located. Such creation errors do not reflect on  the standard of medical care.

## 2021-10-27 DIAGNOSIS — H40033 Anatomical narrow angle, bilateral: Secondary | ICD-10-CM | POA: Diagnosis not present

## 2021-10-27 DIAGNOSIS — H04123 Dry eye syndrome of bilateral lacrimal glands: Secondary | ICD-10-CM | POA: Diagnosis not present

## 2021-10-27 DIAGNOSIS — Z79899 Other long term (current) drug therapy: Secondary | ICD-10-CM | POA: Diagnosis not present

## 2021-11-07 ENCOUNTER — Ambulatory Visit (INDEPENDENT_AMBULATORY_CARE_PROVIDER_SITE_OTHER): Payer: 59 | Admitting: Rheumatology

## 2021-11-07 ENCOUNTER — Other Ambulatory Visit (HOSPITAL_BASED_OUTPATIENT_CLINIC_OR_DEPARTMENT_OTHER): Payer: Self-pay

## 2021-11-07 ENCOUNTER — Encounter: Payer: Self-pay | Admitting: Rheumatology

## 2021-11-07 ENCOUNTER — Other Ambulatory Visit: Payer: Self-pay

## 2021-11-07 VITALS — BP 121/82 | HR 79 | Ht 67.0 in | Wt 197.0 lb

## 2021-11-07 DIAGNOSIS — I7300 Raynaud's syndrome without gangrene: Secondary | ICD-10-CM

## 2021-11-07 DIAGNOSIS — M19041 Primary osteoarthritis, right hand: Secondary | ICD-10-CM | POA: Diagnosis not present

## 2021-11-07 DIAGNOSIS — R5383 Other fatigue: Secondary | ICD-10-CM | POA: Diagnosis not present

## 2021-11-07 DIAGNOSIS — M359 Systemic involvement of connective tissue, unspecified: Secondary | ICD-10-CM | POA: Diagnosis not present

## 2021-11-07 DIAGNOSIS — E559 Vitamin D deficiency, unspecified: Secondary | ICD-10-CM

## 2021-11-07 DIAGNOSIS — M797 Fibromyalgia: Secondary | ICD-10-CM | POA: Diagnosis not present

## 2021-11-07 DIAGNOSIS — M7061 Trochanteric bursitis, right hip: Secondary | ICD-10-CM | POA: Diagnosis not present

## 2021-11-07 DIAGNOSIS — Z79899 Other long term (current) drug therapy: Secondary | ICD-10-CM | POA: Diagnosis not present

## 2021-11-07 DIAGNOSIS — Z8719 Personal history of other diseases of the digestive system: Secondary | ICD-10-CM

## 2021-11-07 DIAGNOSIS — Z8659 Personal history of other mental and behavioral disorders: Secondary | ICD-10-CM

## 2021-11-07 DIAGNOSIS — M7062 Trochanteric bursitis, left hip: Secondary | ICD-10-CM

## 2021-11-07 DIAGNOSIS — Z8639 Personal history of other endocrine, nutritional and metabolic disease: Secondary | ICD-10-CM

## 2021-11-07 DIAGNOSIS — M3500 Sicca syndrome, unspecified: Secondary | ICD-10-CM | POA: Diagnosis not present

## 2021-11-07 DIAGNOSIS — M19042 Primary osteoarthritis, left hand: Secondary | ICD-10-CM

## 2021-11-07 MED ORDER — HYDROXYCHLOROQUINE SULFATE 200 MG PO TABS
ORAL_TABLET | ORAL | 0 refills | Status: DC
  Filled 2021-11-07 – 2021-11-21 (×2): qty 45, 90d supply, fill #0

## 2021-11-07 NOTE — Patient Instructions (Signed)
Vaccines You are taking a medication(s) that can suppress your immune system.  The following immunizations are recommended: Flu annually Covid-19  Td/Tdap (tetanus, diphtheria, pertussis) every 10 years Pneumonia (Prevnar 15 then Pneumovax 23 at least 1 year apart.  Alternatively, can take Prevnar 20 without needing additional dose) Shingrix: 2 doses from 4 weeks to 6 months apart  Please check with your PCP to make sure you are up to date.   Hand Exercises Hand exercises can be helpful for almost anyone. These exercises can strengthen the hands, improve flexibility and movement, and increase blood flow to the hands. These results can make work and daily tasks easier. Hand exercises can be especially helpful for people who have joint pain from arthritis or have nerve damage from overuse (carpal tunnel syndrome). These exercises can also help people who have injured a hand. Exercises Most of these hand exercises are gentle stretching and motion exercises. It is usually safe to do them often throughout the day. Warming up your hands before exercise may help to reduce stiffness. You can do this with gentle massage or by placing your hands in warm water for 10-15 minutes. It is normal to feel some stretching, pulling, tightness, or mild discomfort as you begin new exercises. This will gradually improve. Stop an exercise right away if you feel sudden, severe pain or your pain gets worse. Ask your health care provider which exercises are best for you. Knuckle bend or "claw" fist  Stand or sit with your arm, hand, and all five fingers pointed straight up. Make sure to keep your wrist straight during the exercise. Gently bend your fingers down toward your palm until the tips of your fingers are touching the top of your palm. Keep your big knuckle straight and just bend the small knuckles in your fingers. Hold this position for __________ seconds. Straighten (extend) your fingers back to the starting  position. Repeat this exercise 5-10 times with each hand. Full finger fist  Stand or sit with your arm, hand, and all five fingers pointed straight up. Make sure to keep your wrist straight during the exercise. Gently bend your fingers into your palm until the tips of your fingers are touching the middle of your palm. Hold this position for __________ seconds. Extend your fingers back to the starting position, stretching every joint fully. Repeat this exercise 5-10 times with each hand. Straight fist Stand or sit with your arm, hand, and all five fingers pointed straight up. Make sure to keep your wrist straight during the exercise. Gently bend your fingers at the big knuckle, where your fingers meet your hand, and the middle knuckle. Keep the knuckle at the tips of your fingers straight and try to touch the bottom of your palm. Hold this position for __________ seconds. Extend your fingers back to the starting position, stretching every joint fully. Repeat this exercise 5-10 times with each hand. Tabletop  Stand or sit with your arm, hand, and all five fingers pointed straight up. Make sure to keep your wrist straight during the exercise. Gently bend your fingers at the big knuckle, where your fingers meet your hand, as far down as you can while keeping the small knuckles in your fingers straight. Think of forming a tabletop with your fingers. Hold this position for __________ seconds. Extend your fingers back to the starting position, stretching every joint fully. Repeat this exercise 5-10 times with each hand. Finger spread  Place your hand flat on a table with your palm  facing down. Make sure your wrist stays straight as you do this exercise. Spread your fingers and thumb apart from each other as far as you can until you feel a gentle stretch. Hold this position for __________ seconds. Bring your fingers and thumb tight together again. Hold this position for __________ seconds. Repeat  this exercise 5-10 times with each hand. Making circles  Stand or sit with your arm, hand, and all five fingers pointed straight up. Make sure to keep your wrist straight during the exercise. Make a circle by touching the tip of your thumb to the tip of your index finger. Hold for __________ seconds. Then open your hand wide. Repeat this motion with your thumb and each finger on your hand. Repeat this exercise 5-10 times with each hand. Thumb motion  Sit with your forearm resting on a table and your wrist straight. Your thumb should be facing up toward the ceiling. Keep your fingers relaxed as you move your thumb. Lift your thumb up as high as you can toward the ceiling. Hold for __________ seconds. Bend your thumb across your palm as far as you can, reaching the tip of your thumb for the small finger (pinkie) side of your palm. Hold for __________ seconds. Repeat this exercise 5-10 times with each hand. Grip strengthening  Hold a stress ball or other soft ball in the middle of your hand. Slowly increase the pressure, squeezing the ball as much as you can without causing pain. Think of bringing the tips of your fingers into the middle of your palm. All of your finger joints should bend when doing this exercise. Hold your squeeze for __________ seconds, then relax. Repeat this exercise 5-10 times with each hand. Contact a health care provider if: Your hand pain or discomfort gets much worse when you do an exercise. Your hand pain or discomfort does not improve within 2 hours after you exercise. If you have any of these problems, stop doing these exercises right away. Do not do them again unless your health care provider says that you can. Get help right away if: You develop sudden, severe hand pain or swelling. If this happens, stop doing these exercises right away. Do not do them again unless your health care provider says that you can. This information is not intended to replace advice  given to you by your health care provider. Make sure you discuss any questions you have with your health care provider. Document Revised: 03/02/2021 Document Reviewed: 03/02/2021 Elsevier Patient Education  [employer].

## 2021-11-12 LAB — CBC WITH DIFFERENTIAL/PLATELET
Absolute Monocytes: 485 cells/uL (ref 200–950)
Basophils Absolute: 40 cells/uL (ref 0–200)
Basophils Relative: 0.7 %
Eosinophils Absolute: 63 cells/uL (ref 15–500)
Eosinophils Relative: 1.1 %
HCT: 42 % (ref 35.0–45.0)
Hemoglobin: 14.1 g/dL (ref 11.7–15.5)
Lymphs Abs: 1613 cells/uL (ref 850–3900)
MCH: 28.4 pg (ref 27.0–33.0)
MCHC: 33.6 g/dL (ref 32.0–36.0)
MCV: 84.5 fL (ref 80.0–100.0)
MPV: 11 fL (ref 7.5–12.5)
Monocytes Relative: 8.5 %
Neutro Abs: 3500 cells/uL (ref 1500–7800)
Neutrophils Relative %: 61.4 %
Platelets: 277 10*3/uL (ref 140–400)
RBC: 4.97 10*6/uL (ref 3.80–5.10)
RDW: 13.1 % (ref 11.0–15.0)
Total Lymphocyte: 28.3 %
WBC: 5.7 10*3/uL (ref 3.8–10.8)

## 2021-11-12 LAB — ANTI-NUCLEAR AB-TITER (ANA TITER): ANA Titer 1: 1:320 {titer} — ABNORMAL HIGH

## 2021-11-12 LAB — URINALYSIS, ROUTINE W REFLEX MICROSCOPIC
Bilirubin Urine: NEGATIVE
Glucose, UA: NEGATIVE
Hgb urine dipstick: NEGATIVE
Ketones, ur: NEGATIVE
Leukocytes,Ua: NEGATIVE
Nitrite: NEGATIVE
Protein, ur: NEGATIVE
Specific Gravity, Urine: 1.013 (ref 1.001–1.035)
pH: 7 (ref 5.0–8.0)

## 2021-11-12 LAB — ANA: Anti Nuclear Antibody (ANA): POSITIVE — AB

## 2021-11-12 LAB — C3 AND C4
C3 Complement: 160 mg/dL (ref 83–193)
C4 Complement: 37 mg/dL (ref 15–57)

## 2021-11-12 LAB — ANTI-DNA ANTIBODY, DOUBLE-STRANDED: ds DNA Ab: <1 IU/mL

## 2021-11-12 LAB — SEDIMENTATION RATE: Sed Rate: 6 mm/h (ref 0–30)

## 2021-11-13 NOTE — Progress Notes (Signed)
ANA is positive and stable.  Sed rate is normal, complements are normal, double-stranded DNA is negative, UA is negative, CBC is normal.  Labs do not indicate active disease.

## 2021-11-17 ENCOUNTER — Other Ambulatory Visit (HOSPITAL_BASED_OUTPATIENT_CLINIC_OR_DEPARTMENT_OTHER): Payer: Self-pay

## 2021-11-21 ENCOUNTER — Other Ambulatory Visit (HOSPITAL_BASED_OUTPATIENT_CLINIC_OR_DEPARTMENT_OTHER): Payer: Self-pay

## 2021-11-21 MED ORDER — CARESTART COVID-19 HOME TEST VI KIT
PACK | 0 refills | Status: DC
  Filled 2021-11-21: qty 2, 4d supply, fill #0

## 2021-11-22 ENCOUNTER — Other Ambulatory Visit (HOSPITAL_BASED_OUTPATIENT_CLINIC_OR_DEPARTMENT_OTHER): Payer: Self-pay

## 2021-11-22 ENCOUNTER — Ambulatory Visit: Payer: 59 | Attending: Internal Medicine

## 2021-11-22 DIAGNOSIS — Z23 Encounter for immunization: Secondary | ICD-10-CM

## 2021-11-22 NOTE — Progress Notes (Signed)
° °  Covid-19 Vaccination Clinic  Name:  Sherry Jacobs    MRN: [id number] DOB: 11/13/1966  11/22/2021  [name] was observed post Covid-19 immunization for 15 minutes without incident. She was provided with Vaccine Information Sheet and instruction to access the V-Safe system.   [name] was instructed to call 911 with any severe reactions post vaccine: Difficulty breathing  Swelling of face and throat  A fast heartbeat  A bad rash all over body  Dizziness and weakness   Immunizations Administered     Name Date Dose VIS Date Route   Moderna Covid-19 vaccine Bivalent Booster 11/22/2021 12:50 PM 0.5 mL 07/08/2021 Intramuscular   Manufacturer: Levan Hurst   Lot: [id number]   [place]: 57322-567-20

## 2021-11-24 ENCOUNTER — Other Ambulatory Visit (HOSPITAL_BASED_OUTPATIENT_CLINIC_OR_DEPARTMENT_OTHER): Payer: Self-pay

## 2021-11-24 MED ORDER — MODERNA COVID-19 BIVAL BOOSTER 50 MCG/0.5ML IM SUSP
INTRAMUSCULAR | 0 refills | Status: DC
  Filled 2021-11-24: qty 0.5, 1d supply, fill #0

## 2022-01-02 ENCOUNTER — Other Ambulatory Visit (HOSPITAL_BASED_OUTPATIENT_CLINIC_OR_DEPARTMENT_OTHER): Payer: Self-pay

## 2022-01-12 ENCOUNTER — Ambulatory Visit (AMBULATORY_SURGERY_CENTER): Payer: 59 | Admitting: *Deleted

## 2022-01-12 ENCOUNTER — Other Ambulatory Visit (HOSPITAL_BASED_OUTPATIENT_CLINIC_OR_DEPARTMENT_OTHER): Payer: Self-pay

## 2022-01-12 ENCOUNTER — Other Ambulatory Visit: Payer: Self-pay

## 2022-01-12 VITALS — Ht 67.0 in | Wt 195.0 lb

## 2022-01-12 DIAGNOSIS — Z1211 Encounter for screening for malignant neoplasm of colon: Secondary | ICD-10-CM

## 2022-01-12 MED ORDER — NA SULFATE-K SULFATE-MG SULF 17.5-3.13-1.6 GM/177ML PO SOLN
2.0000 | Freq: Once | ORAL | 0 refills | Status: AC
  Filled 2022-01-12 – 2022-02-09 (×2): qty 354, 1d supply, fill #0

## 2022-01-12 NOTE — Progress Notes (Signed)

## 2022-01-16 DIAGNOSIS — Z1231 Encounter for screening mammogram for malignant neoplasm of breast: Secondary | ICD-10-CM | POA: Diagnosis not present

## 2022-01-22 ENCOUNTER — Other Ambulatory Visit (HOSPITAL_BASED_OUTPATIENT_CLINIC_OR_DEPARTMENT_OTHER): Payer: Self-pay

## 2022-01-24 ENCOUNTER — Encounter: Payer: 59 | Admitting: Gastroenterology

## 2022-02-05 ENCOUNTER — Other Ambulatory Visit (HOSPITAL_BASED_OUTPATIENT_CLINIC_OR_DEPARTMENT_OTHER): Payer: Self-pay

## 2022-02-05 ENCOUNTER — Other Ambulatory Visit: Payer: Self-pay | Admitting: Family Medicine

## 2022-02-05 MED ORDER — ROSUVASTATIN CALCIUM 10 MG PO TABS
10.0000 mg | ORAL_TABLET | Freq: Every day | ORAL | 1 refills | Status: DC
Start: 2022-02-05 — End: 2022-10-22
  Filled 2022-02-05: qty 90, 90d supply, fill #0
  Filled 2022-05-07: qty 90, 90d supply, fill #1

## 2022-02-09 ENCOUNTER — Other Ambulatory Visit (HOSPITAL_BASED_OUTPATIENT_CLINIC_OR_DEPARTMENT_OTHER): Payer: Self-pay

## 2022-02-16 ENCOUNTER — Other Ambulatory Visit (HOSPITAL_BASED_OUTPATIENT_CLINIC_OR_DEPARTMENT_OTHER): Payer: Self-pay

## 2022-02-24 ENCOUNTER — Encounter: Payer: Self-pay | Admitting: Gastroenterology

## 2022-02-27 ENCOUNTER — Telehealth: Payer: Self-pay | Admitting: Gastroenterology

## 2022-02-27 NOTE — Telephone Encounter (Signed)
Inbound call from patient stating that she has a cold and she is scheduled to have a colonoscopy on 4/6. Patient stated that she has taken 2 COVID test and they were both negative. States that she has a runny nose, headache and somewhat of a cough. Patient is seeking advice if she is still okay to come in to have her procedure. Please advise.  ?

## 2022-02-27 NOTE — Telephone Encounter (Signed)
Pt states that she started feeling sick on Sunday with cold like symptoms.  She was hoping to feel better by Thursday for her procedure but continues to have cough, runny nose and headache.  She would like to reschedule if possible.  Rescheduled her for Wednesday 03/14/22 @ 10:00 am.  Will send instructions with new times and dates via my chart.   ?

## 2022-03-01 ENCOUNTER — Encounter: Payer: 59 | Admitting: Gastroenterology

## 2022-03-01 ENCOUNTER — Other Ambulatory Visit (HOSPITAL_BASED_OUTPATIENT_CLINIC_OR_DEPARTMENT_OTHER): Payer: Self-pay

## 2022-03-01 ENCOUNTER — Telehealth: Payer: 59 | Admitting: Physician Assistant

## 2022-03-01 DIAGNOSIS — J208 Acute bronchitis due to other specified organisms: Secondary | ICD-10-CM

## 2022-03-01 MED ORDER — BENZONATATE 100 MG PO CAPS
100.0000 mg | ORAL_CAPSULE | Freq: Three times a day (TID) | ORAL | 0 refills | Status: DC | PRN
  Filled 2022-03-01: qty 30, 10d supply, fill #0

## 2022-03-01 MED ORDER — CARESTART COVID-19 HOME TEST VI KIT
PACK | 0 refills | Status: DC
  Filled 2022-03-01: qty 2, 4d supply, fill #0

## 2022-03-01 NOTE — Progress Notes (Signed)
We are sorry that you are not feeling well.  Here is how we plan to help! ° °Based on your presentation I believe you most likely have A cough due to a virus.  This is called viral bronchitis and is best treated by rest, plenty of fluids and control of the cough.  You may use Ibuprofen or Tylenol as directed to help your symptoms.   °  °In addition you may use A prescription cough medication called Tessalon Perles 100mg. You may take 1-2 capsules every 8 hours as needed for your cough. ° ° °From your responses in the eVisit questionnaire you describe inflammation in the upper respiratory tract which is causing a significant cough.  This is commonly called Bronchitis and has four common causes:   °Allergies °Viral Infections °Acid Reflux °Bacterial Infection °Allergies, viruses and acid reflux are treated by controlling symptoms or eliminating the cause. An example might be a cough caused by taking certain blood pressure medications. You stop the cough by changing the medication. Another example might be a cough caused by acid reflux. Controlling the reflux helps control the cough. ° °USE OF BRONCHODILATOR ("RESCUE") INHALERS: °There is a risk from using your bronchodilator too frequently.  The risk is that over-reliance on a medication which only relaxes the muscles surrounding the breathing tubes can reduce the effectiveness of medications prescribed to reduce swelling and congestion of the tubes themselves.  Although you feel brief relief from the bronchodilator inhaler, your asthma may actually be worsening with the tubes becoming more swollen and filled with mucus.  This can delay other crucial treatments, such as oral steroid medications. If you need to use a bronchodilator inhaler daily, several times per day, you should discuss this with your provider.  There are probably better treatments that could be used to keep your asthma under control.  °   °HOME CARE °Only take medications as instructed by your  medical team. °Complete the entire course of an antibiotic. °Drink plenty of fluids and get plenty of rest. °Avoid close contacts especially the very young and the elderly °Cover your mouth if you cough or cough into your sleeve. °Always remember to wash your hands °A steam or ultrasonic humidifier can help congestion.  ° °GET HELP RIGHT AWAY IF: °You develop worsening fever. °You become short of breath °You cough up blood. °Your symptoms persist after you have completed your treatment plan °MAKE SURE YOU  °Understand these instructions. °Will watch your condition. °Will get help right away if you are not doing well or get worse. °  ° °Thank you for choosing an e-visit. ° °Your e-visit answers were reviewed by a board certified advanced clinical practitioner to complete your personal care plan. Depending upon the condition, your plan could have included both over the counter or prescription medications. ° °Please review your pharmacy choice. Make sure the pharmacy is open so you can pick up prescription now. If there is a problem, you may contact your provider through MyChart messaging and have the prescription routed to another pharmacy.  Your safety is important to us. If you have drug allergies check your prescription carefully.  ° °For the next 24 hours you can use MyChart to ask questions about today's visit, request a non-urgent call back, or ask for a work or school excuse. °You will get an email in the next two days asking about your experience. I hope that your e-visit has been valuable and will speed your recovery. ° °

## 2022-03-01 NOTE — Progress Notes (Signed)
I have spent 5 minutes in review of e-visit questionnaire, review and updating patient chart, medical decision making and response to patient.   Ekam Bonebrake Cody Paislei Dorval, PA-C    

## 2022-03-08 ENCOUNTER — Other Ambulatory Visit: Payer: 59

## 2022-03-08 ENCOUNTER — Telehealth: Payer: Self-pay | Admitting: Gastroenterology

## 2022-03-08 ENCOUNTER — Other Ambulatory Visit (HOSPITAL_BASED_OUTPATIENT_CLINIC_OR_DEPARTMENT_OTHER): Payer: Self-pay

## 2022-03-08 ENCOUNTER — Telehealth: Payer: 59 | Admitting: Family Medicine

## 2022-03-08 ENCOUNTER — Other Ambulatory Visit: Payer: Self-pay | Admitting: Rheumatology

## 2022-03-08 DIAGNOSIS — B9689 Other specified bacterial agents as the cause of diseases classified elsewhere: Secondary | ICD-10-CM | POA: Diagnosis not present

## 2022-03-08 DIAGNOSIS — J019 Acute sinusitis, unspecified: Secondary | ICD-10-CM

## 2022-03-08 DIAGNOSIS — M359 Systemic involvement of connective tissue, unspecified: Secondary | ICD-10-CM

## 2022-03-08 MED ORDER — AMOXICILLIN-POT CLAVULANATE 875-125 MG PO TABS
1.0000 | ORAL_TABLET | Freq: Two times a day (BID) | ORAL | 0 refills | Status: AC
  Filled 2022-03-08: qty 14, 7d supply, fill #0

## 2022-03-08 MED ORDER — HYDROXYCHLOROQUINE SULFATE 200 MG PO TABS
ORAL_TABLET | ORAL | 0 refills | Status: DC
  Filled 2022-03-08: qty 45, 90d supply, fill #0

## 2022-03-08 NOTE — Telephone Encounter (Signed)
Patient has an upcoming procedure.  She was diagnosed with sinusitis and was put on a 7-day course of Augmentin.  The day of her colonoscopy will be her day 7.  Is it OK for her to proceed after having taken this medication?  Please call patient and advise.  Thank you. ?

## 2022-03-08 NOTE — Telephone Encounter (Signed)
Yes - ok to proceed on or off the Augmentin  ?Pt verbalized understanding  ?

## 2022-03-08 NOTE — Telephone Encounter (Signed)
Next Visit: 04/09/2022 ? ?Last Visit: 11/07/2021 ? ?Labs: 11/07/2021 CBC WNL, 09/21/2021 CMP WNL ? ?Eye exam: 10/27/2021 WNL   ? ?Current Dose per office note 11/07/2022: Plaquenil 200 mg 1 tablet by mouth every other day. ? ?[id number] disease  ? ?Last Fill: 11/07/2021 ? ?Okay to refill Plaquenil?  ?

## 2022-03-08 NOTE — Progress Notes (Signed)

## 2022-03-12 ENCOUNTER — Telehealth: Payer: Self-pay | Admitting: Gastroenterology

## 2022-03-12 NOTE — Telephone Encounter (Signed)
Patient called to reschedule colonoscopy for 03/14/22 to 04/25/22 due to family funeral.  ?

## 2022-03-14 ENCOUNTER — Encounter: Payer: 59 | Admitting: Gastroenterology

## 2022-03-26 NOTE — Progress Notes (Signed)
? ?Office Visit Note ? ?Patient: Sherry Jacobs             ?Date of Birth: 1966/06/08           ?MRN: [id number]             ?PCP: Ann Held, DO ?Referring: Ann Held, * ?Visit Date: 04/09/2022 ?Occupation: '@GUAROCC'$ @ ? ?Subjective:  ?Discuss stopping plaquenil  ? ?History of Present Illness: Sherry Jacobs is a 56 y.o. female with history of autoimmune disease and osteoarthritis.  Patient is currently taking Plaquenil 200 mg 1 tablet by mouth every other day.  She continues to tolerate Plaquenil without any side effects and has not missed any doses recently.  She denies any signs or symptoms of an autoimmune disease flare since her last office visit.  She has not had any recent rashes, increased hair loss, photosensitivity, Raynaud's phenomenon, oral or nasal ulcerations, sicca symptoms, shortness of breath, or pleuritic chest pain.  She is not currently experiencing any joint pain or joint swelling.  She has not had any morning stiffness or difficulty with ADLs. ?Patient reports that she previously had a bone density in September 2022 which was normal.  She has been taking a multivitamin which has vitamin D.  She has been exercising 3 days a week by going to [employer]. ? ? ? ?Activities of Daily Living:  ?Patient reports morning stiffness for 0 minutes.   ?Patient Denies nocturnal pain.  ?Difficulty dressing/grooming: Denies ?Difficulty climbing stairs: Denies ?Difficulty getting out of chair: Denies ?Difficulty using hands for taps, buttons, cutlery, and/or writing: Denies ? ?Review of Systems  ?Constitutional:  Negative for fatigue.  ?HENT:  Negative for mouth sores, mouth dryness and nose dryness.   ?Eyes:  Negative for pain, itching, visual disturbance and dryness.  ?Respiratory:  Negative for cough, hemoptysis, shortness of breath and difficulty breathing.   ?Cardiovascular:  Negative for chest pain, palpitations, hypertension and swelling in legs/feet.  ?Gastrointestinal:  Negative  for blood in stool, constipation and diarrhea.  ?Endocrine: Negative for increased urination.  ?Genitourinary:  Negative for difficulty urinating and painful urination.  ?Musculoskeletal:  Negative for joint pain, joint pain, joint swelling, myalgias, muscle weakness, morning stiffness, muscle tenderness and myalgias.  ?Skin:  Negative for color change, pallor, rash, hair loss, nodules/bumps, redness, skin tightness, ulcers and sensitivity to sunlight.  ?Allergic/Immunologic: Negative for susceptible to infections.  ?Neurological:  Negative for dizziness, numbness, headaches, memory loss and weakness.  ?Hematological:  Negative for bruising/bleeding tendency and swollen glands.  ?Psychiatric/Behavioral:  Negative for depressed mood, confusion and sleep disturbance. The patient is not nervous/anxious.   ? ?PMFS History:  ?Patient Active Problem List  ? Diagnosis Date Noted  ? Preventative health care 01/02/2018  ? URI (upper respiratory infection) 02/19/2017  ? Neck strain 02/19/2017  ? High risk medication use 01/02/2017  ? Right ankle pain 08/05/2013  ? Cellulitis, toe 03/21/2012  ? UNSPECIFIED VITAMIN D DEFICIENCY 02/02/2009  ? 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 LEG PAIN, LEFT 10/28/2008  ? ANXIETY 06/23/2008  ? UNSPECIFIED OSTEOPOROSIS 12/29/2007  ? HYPERLIPIDEMIA 10/02/2007  ? INJURY, 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 09/09/2007  ? Palpitations 09/09/2007  ? ANA POSITIVE, HX OF 09/09/2007  ? IRRITABLE BOWEL SYNDROME 04/11/2007  ? FIBROMYALGIA 04/11/2007  ?  ?Past Medical History:  ?Diagnosis Date  ? ANA positive   ? Arthralgia   ? Autoimmune disease ([place])   ? ANA positive  ? Endometrial polyp   ? Hyperlipidemia   ?  IBS (irritable bowel syndrome)   ? Iron deficiency anemia   ? Lactose intolerance in adult   ? PONV (postoperative nausea and vomiting)   ? Social anxiety disorder   ? Wears contact lenses   ?  ?Family History  ?Problem Relation Age of Onset  ? Breast cancer Mother   ? Colon polyps Father   ? Melanoma Maternal  Grandfather   ? Breast cancer Paternal Grandmother   ? Asthma Daughter   ?     exercise induced   ? Asthma Son   ?     exercise induced   ? Diabetes Other   ? Hypertension Other   ? Alcohol abuse Other   ? Hyperlipidemia Other   ? Arthritis Other   ? Stroke Other   ? Thyroid disease Other   ? Uterine cancer Other   ? Colon cancer Neg Hx   ? Esophageal cancer Neg Hx   ? Stomach cancer Neg Hx   ? Rectal cancer Neg Hx   ? ?Past Surgical History:  ?Procedure Laterality Date  ? APPENDECTOMY  1981  ? DILATATION & CURETTAGE/HYSTEROSCOPY WITH MYOSURE N/A 10/28/2014  ? Procedure: DILATATION & CURETTAGE/HYSTEROSCOPY WITH MYOSURE;  Surgeon: Marylynn Pearson, MD;  Location: [facility];  Service: Gynecology;  Laterality: N/A;  ? RIGHT MODIFIED NECK DISSECTION W/ REMOVAL BENIGN RIGHT PARAPHARYNGEAL SPACE MASS AND LYMPH NODES  08-02-2004  ? benign bronchogenic cysts / negative lymph nodes  ? ?Social History  ? ?Social History Narrative  ? 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 Living at home with husband and two kids  ?   ?   ?               ? ?Immunization History  ?Administered Date(s) Administered  ? H1N1 10/13/2008  ? Influenza Whole 11/26/2004, 09/15/2008, 08/30/2010  ? Influenza-Unspecified 08/27/2011, 08/26/2016, 09/05/2019, 09/04/2020  ? Moderna Covid-19 Vaccine Bivalent Booster 42yr & up 11/22/2021  ? PFIZER(Purple Top)SARS-COV-2 Vaccination 11/17/2019, 12/08/2019  ? Pneumococcal Polysaccharide-23 05/11/2011  ? Td 12/15/2008  ? Zoster Recombinat (Shingrix) 06/06/2021, 08/08/2021  ?  ? ?Objective: ?Vital Signs: BP 118/79 (BP Location: Left Arm, Patient Position: Sitting, Cuff Size: Small)   Pulse 80   Resp 12   Ht '5\' 7"'$  (1.702 m)   Wt 200 lb (90.7 kg)   LMP 04/07/2017   BMI 31.32 kg/m?   ? ?Physical Exam ?Vitals and nursing note reviewed.  ?Constitutional:   ?   Appearance: She is well-developed.  ?HENT:  ?   Head: Normocephalic and atraumatic.  ?Eyes:  ?   Conjunctiva/sclera: Conjunctivae normal.   ?Cardiovascular:  ?   Rate and Rhythm: Normal rate and regular rhythm.  ?   Heart sounds: Normal heart sounds.  ?Pulmonary:  ?   Effort: Pulmonary effort is normal.  ?   Breath sounds: Normal breath sounds.  ?Abdominal:  ?   General: Bowel sounds are normal.  ?   Palpations: Abdomen is soft.  ?Musculoskeletal:  ?   Cervical back: Normal range of motion.  ?Skin: ?   General: Skin is warm and dry.  ?   Capillary Refill: Capillary refill takes less than 2 seconds.  ?Neurological:  ?   Mental Status: She is alert and oriented to person, place, and time.  ?Psychiatric:     ?   Behavior: Behavior normal.  ?  ? ?Musculoskeletal Exam: C-spine, thoracic spine, lumbar spine have good range of motion.  Shoulder joints, elbow joints, wrist joints,  MCPs, PIPs, DIPs have good range of motion with no synovitis.  Complete fist formation bilaterally.  Very minimal PIP and DIP thickening consistent with osteoarthritis of both hands.  Hip joints have good range of motion with no groin pain.  Tenderness palpation over bilateral trochanteric bursa.  Knee joints have good range of motion with no warmth or effusion.  Ankle joints have good range of motion with no tenderness or joint swelling. ? ?CDAI Exam: ?CDAI Score: -- ?Patient Global: --; Provider Global: -- ?Swollen: --; Tender: -- ?Joint Exam 04/09/2022  ? ?No joint exam has been documented for this visit  ? ?There is currently no information documented on the homunculus. Go to the Rheumatology activity and complete the homunculus joint exam. ? ?Investigation: ?No additional findings. ? ?Imaging: ?No results found. ? ?Recent Labs: ?Lab Results  ?Component Value Date  ? WBC 5.7 11/07/2021  ? HGB 14.1 11/07/2021  ? PLT 277 11/07/2021  ? NA 141 09/21/2021  ? K 4.1 09/21/2021  ? CL 102 09/21/2021  ? CO2 30 09/21/2021  ? GLUCOSE 89 09/21/2021  ? BUN 19 09/21/2021  ? CREATININE 0.96 09/21/2021  ? BILITOT 0.5 09/21/2021  ? ALKPHOS 69 09/21/2021  ? AST 21 09/21/2021  ? ALT 24 09/21/2021  ?  PROT 7.4 09/21/2021  ? ALBUMIN 4.8 09/21/2021  ? CALCIUM 9.8 09/21/2021  ? GFRAA 88 01/10/2021  ? ? ?Speciality Comments: PLQ EYe Exam: 10/27/2021 WNL @ [facility],  ? ?Procedures:  ?No procedures perfor

## 2022-04-09 ENCOUNTER — Encounter: Payer: Self-pay | Admitting: Physician Assistant

## 2022-04-09 ENCOUNTER — Ambulatory Visit (INDEPENDENT_AMBULATORY_CARE_PROVIDER_SITE_OTHER): Payer: 59 | Admitting: Physician Assistant

## 2022-04-09 VITALS — BP 118/79 | HR 80 | Resp 12 | Ht 67.0 in | Wt 200.0 lb

## 2022-04-09 DIAGNOSIS — I7300 Raynaud's syndrome without gangrene: Secondary | ICD-10-CM | POA: Diagnosis not present

## 2022-04-09 DIAGNOSIS — Z79899 Other long term (current) drug therapy: Secondary | ICD-10-CM | POA: Diagnosis not present

## 2022-04-09 DIAGNOSIS — R5383 Other fatigue: Secondary | ICD-10-CM | POA: Diagnosis not present

## 2022-04-09 DIAGNOSIS — M3500 Sicca syndrome, unspecified: Secondary | ICD-10-CM

## 2022-04-09 DIAGNOSIS — M797 Fibromyalgia: Secondary | ICD-10-CM | POA: Diagnosis not present

## 2022-04-09 DIAGNOSIS — M7061 Trochanteric bursitis, right hip: Secondary | ICD-10-CM

## 2022-04-09 DIAGNOSIS — M19041 Primary osteoarthritis, right hand: Secondary | ICD-10-CM

## 2022-04-09 DIAGNOSIS — M19042 Primary osteoarthritis, left hand: Secondary | ICD-10-CM

## 2022-04-09 DIAGNOSIS — M7062 Trochanteric bursitis, left hip: Secondary | ICD-10-CM

## 2022-04-09 DIAGNOSIS — Z8659 Personal history of other mental and behavioral disorders: Secondary | ICD-10-CM

## 2022-04-09 DIAGNOSIS — E559 Vitamin D deficiency, unspecified: Secondary | ICD-10-CM

## 2022-04-09 DIAGNOSIS — Z8639 Personal history of other endocrine, nutritional and metabolic disease: Secondary | ICD-10-CM

## 2022-04-09 DIAGNOSIS — M359 Systemic involvement of connective tissue, unspecified: Secondary | ICD-10-CM

## 2022-04-09 DIAGNOSIS — Z8719 Personal history of other diseases of the digestive system: Secondary | ICD-10-CM

## 2022-04-10 NOTE — Progress Notes (Signed)
CBC and CMP WNL.  Complements and ESR WNL.   ?No proteinuria noted.

## 2022-04-11 NOTE — Progress Notes (Signed)
dsDNA is negative

## 2022-04-12 LAB — CBC WITH DIFFERENTIAL/PLATELET
Absolute Monocytes: 567 cells/uL (ref 200–950)
Basophils Absolute: 42 cells/uL (ref 0–200)
Basophils Relative: 0.8 %
Eosinophils Absolute: 120 cells/uL (ref 15–500)
Eosinophils Relative: 2.3 %
HCT: 41.7 % (ref 35.0–45.0)
Hemoglobin: 13.7 g/dL (ref 11.7–15.5)
Lymphs Abs: 1638 cells/uL (ref 850–3900)
MCH: 28 pg (ref 27.0–33.0)
MCHC: 32.9 g/dL (ref 32.0–36.0)
MCV: 85.1 fL (ref 80.0–100.0)
MPV: 11.3 fL (ref 7.5–12.5)
Monocytes Relative: 10.9 %
Neutro Abs: 2834 cells/uL (ref 1500–7800)
Neutrophils Relative %: 54.5 %
Platelets: 255 10*3/uL (ref 140–400)
RBC: 4.9 10*6/uL (ref 3.80–5.10)
RDW: 13.1 % (ref 11.0–15.0)
Total Lymphocyte: 31.5 %
WBC: 5.2 10*3/uL (ref 3.8–10.8)

## 2022-04-12 LAB — URINALYSIS, ROUTINE W REFLEX MICROSCOPIC
Bilirubin Urine: NEGATIVE
Glucose, UA: NEGATIVE
Hgb urine dipstick: NEGATIVE
Ketones, ur: NEGATIVE
Leukocytes,Ua: NEGATIVE
Nitrite: NEGATIVE
Protein, ur: NEGATIVE
Specific Gravity, Urine: 1.016 (ref 1.001–1.035)
pH: 7 (ref 5.0–8.0)

## 2022-04-12 LAB — COMPLETE METABOLIC PANEL WITH GFR
AG Ratio: 2 (calc) (ref 1.0–2.5)
ALT: 24 U/L (ref 6–29)
AST: 19 U/L (ref 10–35)
Albumin: 4.7 g/dL (ref 3.6–5.1)
Alkaline phosphatase (APISO): 56 U/L (ref 37–153)
BUN: 18 mg/dL (ref 7–25)
CO2: 28 mmol/L (ref 20–32)
Calcium: 9.9 mg/dL (ref 8.6–10.4)
Chloride: 104 mmol/L (ref 98–110)
Creat: 0.89 mg/dL (ref 0.50–1.03)
Globulin: 2.4 g/dL (calc) (ref 1.9–3.7)
Glucose, Bld: 84 mg/dL (ref 65–99)
Potassium: 4.1 mmol/L (ref 3.5–5.3)
Sodium: 141 mmol/L (ref 135–146)
Total Bilirubin: 0.4 mg/dL (ref 0.2–1.2)
Total Protein: 7.1 g/dL (ref 6.1–8.1)
eGFR: 76 mL/min/{1.73_m2} (ref >=60)

## 2022-04-12 LAB — ANTI-NUCLEAR AB-TITER (ANA TITER): ANA Titer 1: 1:80 {titer} — ABNORMAL HIGH

## 2022-04-12 LAB — C3 AND C4
C3 Complement: 138 mg/dL (ref 83–193)
C4 Complement: 35 mg/dL (ref 15–57)

## 2022-04-12 LAB — ANA: Anti Nuclear Antibody (ANA): POSITIVE — AB

## 2022-04-12 LAB — ANTI-DNA ANTIBODY, DOUBLE-STRANDED: ds DNA Ab: <1 IU/mL

## 2022-04-12 LAB — SEDIMENTATION RATE: Sed Rate: 2 mm/h (ref 0–30)

## 2022-04-12 NOTE — Progress Notes (Signed)
ANA remains positive but is a low titer-1:80.  Pattern is unchanged.

## 2022-04-25 ENCOUNTER — Ambulatory Visit (AMBULATORY_SURGERY_CENTER): Payer: 59 | Admitting: Gastroenterology

## 2022-04-25 ENCOUNTER — Encounter: Payer: Self-pay | Admitting: Gastroenterology

## 2022-04-25 VITALS — BP 114/75 | HR 73 | Temp 97.3°F | Resp 10 | Ht 67.0 in | Wt 195.0 lb

## 2022-04-25 DIAGNOSIS — D122 Benign neoplasm of ascending colon: Secondary | ICD-10-CM

## 2022-04-25 DIAGNOSIS — Z1211 Encounter for screening for malignant neoplasm of colon: Secondary | ICD-10-CM

## 2022-04-25 MED ORDER — SODIUM CHLORIDE 0.9 % IV SOLN: 500.0000 mL | Freq: Once | INTRAVENOUS | Status: DC

## 2022-04-25 NOTE — Patient Instructions (Signed)
Handouts on hemorrhoids, diverticulosis, and polyps given to patient. Await pathology results. Resume previous diet and continue present medications. Repeat colonoscopy for surveillance will be based off of pathology results.   YOU HAD AN ENDOSCOPIC PROCEDURE TODAY AT [facility]:   Refer to the procedure report that was given to you for any specific questions about what was found during the examination.  If the procedure report does not answer your questions, please call your gastroenterologist to clarify.  If you requested that your care partner not be given the details of your procedure findings, then the procedure report has been included in a sealed envelope for you to review at your convenience later.  YOU SHOULD EXPECT: Some feelings of bloating in the abdomen. Passage of more gas than usual.  Walking can help get rid of the air that was put into your GI tract during the procedure and reduce the bloating. If you had a lower endoscopy (such as a colonoscopy or flexible sigmoidoscopy) you may notice spotting of blood in your stool or on the toilet paper. If you underwent a bowel prep for your procedure, you may not have a normal bowel movement for a few days.  Please Note:  You might notice some irritation and congestion in your nose or some drainage.  This is from the oxygen used during your procedure.  There is no need for concern and it should clear up in a day or so.  SYMPTOMS TO REPORT IMMEDIATELY:  Following lower endoscopy (colonoscopy or flexible sigmoidoscopy):  Excessive amounts of blood in the stool  Significant tenderness or worsening of abdominal pains  Swelling of the abdomen that is new, acute  Fever of 100F or higher  For urgent or emergent issues, a gastroenterologist can be reached at any hour by calling [phone]. Do not use MyChart messaging for urgent concerns.    DIET:  We do recommend a small meal at first, but then you may proceed to your  regular diet.  Drink plenty of fluids but you should avoid alcoholic beverages for 24 hours.  ACTIVITY:  You should plan to take it easy for the rest of today and you should NOT DRIVE or use heavy machinery until tomorrow (because of the sedation medicines used during the test).    FOLLOW UP: Our staff will call the number listed on your records 48-72 hours following your procedure to check on you and address any questions or concerns that you may have regarding the information given to you following your procedure. If we do not reach you, we will leave a message.  We will attempt to reach you two times.  During this call, we will ask if you have developed any symptoms of COVID 19. If you develop any symptoms (ie: fever, flu-like symptoms, shortness of breath, cough etc.) before then, please call [phone].  If you test positive for Covid 19 in the 2 weeks post procedure, please call and report this information to [place].    If any biopsies were taken you will be contacted by phone or by letter within the next 1-3 weeks.  Please call us at [phone] if you have not heard about the biopsies in 3 weeks.    SIGNATURES/CONFIDENTIALITY: You and/or your care partner have signed paperwork which will be entered into your electronic medical record.  These signatures attest to the fact that that the information above on your After Visit Summary has been reviewed and is understood.  Full responsibility of  the confidentiality of this discharge information lies with you and/or your care-partner.

## 2022-04-25 NOTE — Progress Notes (Signed)
Pt non-responsive, VVS, Report to RN  °

## 2022-04-25 NOTE — Progress Notes (Signed)
Called to room to assist during endoscopic procedure.  Patient ID and intended procedure confirmed with present staff. Received instructions for my participation in the procedure from the performing physician.  

## 2022-04-25 NOTE — Op Note (Signed)
[facility] Patient Name: Sherry Jacobs Procedure Date: 04/25/2022 8:30 AM MRN: [id number] Endoscopist: Mallie Mussel L. Loletha Carrow , MD Age: 56 Referring MD:  Date of Birth: [birth year] Gender: Female Account #: [id number] Procedure:                Colonoscopy Indications:              Screening for colorectal malignant neoplasm, This                            is the patient's first colonoscopy Medicines:                Monitored Anesthesia Care Procedure:                Pre-Anesthesia Assessment:                           - Prior to the procedure, a History and Physical                            was performed, and patient medications and                            allergies were reviewed. The patient's tolerance of                            previous anesthesia was also reviewed. The risks                            and benefits of the procedure and the sedation                            options and risks were discussed with the patient.                            All questions were answered, and informed consent                            was obtained. Prior Anticoagulants: The patient has                            taken no previous anticoagulant or antiplatelet                            agents. ASA Grade Assessment: II - A patient with                            mild systemic disease. After reviewing the risks                            and benefits, the patient was deemed in                            satisfactory condition to undergo the procedure.  After obtaining informed consent, the colonoscope                            was passed under direct vision. Throughout the                            procedure, the patient's blood pressure, pulse, and                            oxygen saturations were monitored continuously. The                            Olympus CF-HQ190L (Serial# [id number]) Colonoscope was                            introduced through the  anus and advanced to the the                            cecum, identified by appendiceal orifice and                            ileocecal valve. The colonoscopy was somewhat                            difficult due to a redundant colon and significant                            looping. Successful completion of the procedure was                            aided by using manual pressure and straightening                            and shortening the scope to obtain bowel loop                            reduction. The patient tolerated the procedure                            well. The quality of the bowel preparation was                            excellent. The ileocecal valve, appendiceal                            orifice, and rectum were photographed. The bowel                            preparation used was SUPREP. Scope In: 8:44:12 AM Scope Out: 9:02:37 AM Scope Withdrawal Time: 0 hours 11 minutes 22 seconds  Total Procedure Duration: 0 hours 18 minutes 25 seconds  Findings:                 The perianal and digital rectal examinations were  normal.                           Repeat examination of right colon under NBI                            performed.                           A 6 mm polyp was found in the proximal ascending                            colon. The polyp was partly sessile and partly                            semi-sessile on a fold. The polyp was removed with                            a piecemeal technique using a cold biopsy forceps.                            Resection and retrieval were complete.                           A diminutive polyp was found in the distal                            ascending colon. The polyp was sessile. The polyp                            was removed with a cold snare. Resection and                            retrieval were complete.                           A few diverticula were found in the left colon.                            Internal hemorrhoids were found. The hemorrhoids                            were small.                           The exam was otherwise without abnormality on                            direct and retroflexion views. Complications:            No immediate complications. Estimated Blood Loss:     Estimated blood loss was minimal. Impression:               - One 6 mm polyp in the proximal ascending colon,  removed piecemeal using a cold biopsy forceps.                            Resected and retrieved.                           - One diminutive polyp in the distal ascending                            colon, removed with a cold snare. Resected and                            retrieved.                           - Diverticulosis in the left colon.                           - Internal hemorrhoids.                           - The examination was otherwise normal on direct                            and retroflexion views. Recommendation:           - Patient has a contact number available for                            emergencies. The signs and symptoms of potential                            delayed complications were discussed with the                            patient. Return to normal activities tomorrow.                            Written discharge instructions were provided to the                            patient.                           - Resume previous diet.                           - Continue present medications.                           - Await pathology results.                           - Repeat colonoscopy is recommended for                            surveillance. The colonoscopy date will be  determined after pathology results from today's                            exam become available for review. Malachi Kinzler L. Loletha Carrow, MD 04/25/2022 9:08:03 AM This report has been signed electronically.

## 2022-04-25 NOTE — Progress Notes (Signed)
History and Physical:  This patient presents for endoscopic testing for: Encounter Diagnosis  Name Primary?   Special screening for malignant neoplasms, colon Yes    Average risk - first screening colonoscopy. Patient denies chronic abdominal pain, rectal bleeding, constipation or diarrhea.   Patient is otherwise without complaints or active issues today.   Past Medical History: Past Medical History:  Diagnosis Date   ANA positive    Arthralgia    Autoimmune disease ([facility])    ANA positive   Endometrial polyp    Hyperlipidemia    IBS (irritable bowel syndrome)    Iron deficiency anemia    Lactose intolerance in adult    PONV (postoperative nausea and vomiting)    Social anxiety disorder    Wears contact lenses      Past Surgical History: Past Surgical History:  Procedure Laterality Date   [place] N/A 10/28/2014   Procedure: [place];  Surgeon: Marylynn Pearson, MD;  Location: [facility];  Service: Gynecology;  Laterality: N/A;   RIGHT MODIFIED NECK DISSECTION W/ REMOVAL BENIGN RIGHT PARAPHARYNGEAL SPACE MASS AND LYMPH NODES  08-02-2004   benign bronchogenic cysts / negative lymph nodes    Allergies: Allergies  Allergen Reactions   Sulfa Antibiotics Nausea And Vomiting and Nausea Only    Outpatient Meds: Current Outpatient Medications  Medication Sig Dispense Refill   hydroxychloroquine (PLAQUENIL) 200 MG tablet TAKE 1 TABLET BY MOUTH EVERY OTHER DAY. 45 tablet 0   Multiple Vitamin (MULTIVITAMIN) tablet Take 1 tablet by mouth daily.     rosuvastatin (CRESTOR) 10 MG tablet Take 1 tablet (10 mg total) by mouth daily. 90 tablet 1   ALPRAZolam (XANAX) 0.25 MG tablet Take 0.25 mg by mouth as needed for anxiety.      propranolol (INDERAL) 20 MG tablet Take 20 mg by mouth as needed.     Current Facility-Administered Medications  Medication Dose Route  Frequency Provider Last Rate Last Admin   0.9 %  sodium chloride infusion  500 mL Intravenous Once Doran Stabler, MD          ___________________________________________________________________ Objective   Exam:  BP 109/73   Pulse 78   Temp (!) 97.3 F (36.3 C)   Ht '5\' 7"'$  (1.702 m)   Wt 195 lb (88.5 kg)   LMP 04/07/2017   SpO2 98%   BMI 30.54 kg/m   CV: RRR without murmur, S1/S2 Resp: clear to auscultation bilaterally, normal RR and effort noted GI: soft, no tenderness, with active bowel sounds.   Assessment: Encounter Diagnosis  Name Primary?   Special screening for malignant neoplasms, colon Yes     Plan: Colonoscopy  The benefits and risks of the planned procedure were described in detail with the patient or (when appropriate) their health care proxy.  Risks were outlined as including, but not limited to, bleeding, infection, perforation, adverse medication reaction leading to cardiac or pulmonary decompensation, pancreatitis (if ERCP).  The limitation of incomplete mucosal visualization was also discussed.  No guarantees or warranties were given.    The patient is appropriate for an endoscopic procedure in the ambulatory setting.   - Wilfrid Lund, MD

## 2022-04-25 NOTE — Progress Notes (Signed)
VS by DT  Pt's states no medical or surgical changes since previsit or office visit.  

## 2022-04-26 ENCOUNTER — Telehealth: Payer: Self-pay

## 2022-04-26 NOTE — Telephone Encounter (Signed)
  Follow up Call-     04/25/2022    8:21 AM  Call back number  Post procedure Call Back phone  # [phone]  Permission to leave phone message Yes     Patient questions:  Do you have a fever, pain , or abdominal swelling? No. Pain Score  0 *  Have you tolerated food without any problems? Yes.    Have you been able to return to your normal activities? Yes.    Do you have any questions about your discharge instructions: Diet   No. Medications  No. Follow up visit  No.  Do you have questions or concerns about your Care? No.  Actions: * If pain score is 4 or above: No action needed, pain <4.

## 2022-05-02 ENCOUNTER — Encounter: Payer: Self-pay | Admitting: Gastroenterology

## 2022-05-04 ENCOUNTER — Other Ambulatory Visit (HOSPITAL_BASED_OUTPATIENT_CLINIC_OR_DEPARTMENT_OTHER): Payer: Self-pay

## 2022-05-04 DIAGNOSIS — B354 Tinea corporis: Secondary | ICD-10-CM | POA: Diagnosis not present

## 2022-05-04 MED ORDER — KETOCONAZOLE 2 % EX CREA
TOPICAL_CREAM | CUTANEOUS | 0 refills | Status: DC
  Filled 2022-05-04: qty 15, 14d supply, fill #0

## 2022-05-07 ENCOUNTER — Other Ambulatory Visit (HOSPITAL_BASED_OUTPATIENT_CLINIC_OR_DEPARTMENT_OTHER): Payer: Self-pay

## 2022-05-23 ENCOUNTER — Other Ambulatory Visit (HOSPITAL_BASED_OUTPATIENT_CLINIC_OR_DEPARTMENT_OTHER): Payer: Self-pay

## 2022-05-23 MED ORDER — ALPRAZOLAM 0.5 MG PO TABS
ORAL_TABLET | ORAL | 0 refills | Status: DC
  Filled 2022-05-23: qty 60, 30d supply, fill #0

## 2022-05-23 MED ORDER — PROPRANOLOL HCL 20 MG PO TABS
ORAL_TABLET | ORAL | 0 refills | Status: DC
  Filled 2022-05-23: qty 90, 30d supply, fill #0

## 2022-05-25 ENCOUNTER — Other Ambulatory Visit (HOSPITAL_BASED_OUTPATIENT_CLINIC_OR_DEPARTMENT_OTHER): Payer: Self-pay

## 2022-05-28 ENCOUNTER — Other Ambulatory Visit (HOSPITAL_BASED_OUTPATIENT_CLINIC_OR_DEPARTMENT_OTHER): Payer: Self-pay

## 2022-05-28 MED ORDER — ALPRAZOLAM 0.25 MG PO TABS
ORAL_TABLET | Freq: Two times a day (BID) | ORAL | 0 refills | Status: DC
  Filled 2022-05-28: qty 180, 90d supply, fill #0
  Filled 2022-05-30: qty 60, 30d supply, fill #0
  Filled 2022-06-25: qty 15, 8d supply, fill #0

## 2022-05-30 ENCOUNTER — Other Ambulatory Visit (HOSPITAL_BASED_OUTPATIENT_CLINIC_OR_DEPARTMENT_OTHER): Payer: Self-pay

## 2022-05-31 ENCOUNTER — Encounter: Payer: 59 | Admitting: Family Medicine

## 2022-06-07 ENCOUNTER — Other Ambulatory Visit (HOSPITAL_BASED_OUTPATIENT_CLINIC_OR_DEPARTMENT_OTHER): Payer: Self-pay

## 2022-06-14 ENCOUNTER — Encounter: Payer: 59 | Admitting: Family Medicine

## 2022-06-25 ENCOUNTER — Other Ambulatory Visit (HOSPITAL_BASED_OUTPATIENT_CLINIC_OR_DEPARTMENT_OTHER): Payer: Self-pay

## 2022-06-25 ENCOUNTER — Telehealth: Payer: 59 | Admitting: Nurse Practitioner

## 2022-06-25 DIAGNOSIS — N3000 Acute cystitis without hematuria: Secondary | ICD-10-CM | POA: Diagnosis not present

## 2022-06-25 MED ORDER — CEPHALEXIN 500 MG PO CAPS
500.0000 mg | ORAL_CAPSULE | Freq: Two times a day (BID) | ORAL | 0 refills | Status: AC
  Filled 2022-06-25: qty 14, 7d supply, fill #0

## 2022-06-25 NOTE — Progress Notes (Signed)
E-Visit for Urinary Problems  We are sorry that you are not feeling well.  Here is how we plan to help!  Based on what you shared with me it looks like you most likely have a simple urinary tract infection.  A UTI (Urinary Tract Infection) is a bacterial infection of the bladder.  Most cases of urinary tract infections are simple to treat but a key part of your care is to encourage you to drink plenty of fluids and watch your symptoms carefully.  I have prescribed Keflex 500 mg twice a day for 7 days.  Your symptoms should gradually improve. Call us if the burning in your urine worsens, you develop worsening fever, back pain or pelvic pain or if your symptoms do not resolve after completing the antibiotic.  Urinary tract infections can be prevented by drinking plenty of water to keep your body hydrated.  Also be sure when you wipe, wipe from front to back and don't hold it in!  If possible, empty your bladder every 4 hours.  HOME CARE Drink plenty of fluids Compete the full course of the antibiotics even if the symptoms resolve Remember, when you need to go.go. Holding in your urine can increase the likelihood of getting a UTI! GET HELP RIGHT AWAY IF: You cannot urinate You get a high fever Worsening back pain occurs You see blood in your urine You feel sick to your stomach or throw up You feel like you are going to pass out  MAKE SURE YOU  Understand these instructions. Will watch your condition. Will get help right away if you are not doing well or get worse.   Thank you for choosing an e-visit.  Your e-visit answers were reviewed by a board certified advanced clinical practitioner to complete your personal care plan. Depending upon the condition, your plan could have included both over the counter or prescription medications.  Please review your pharmacy choice. Make sure the pharmacy is open so you can pick up prescription now. If there is a problem, you may contact your  provider through [employer] and have the prescription routed to another pharmacy.  Your safety is important to [place]. If you have drug allergies check your prescription carefully.   For the next 24 hours you can use MyChart to ask questions about today's visit, request a non-urgent call back, or ask for a work or school excuse. You will get an email in the next two days asking about your experience. I hope that your e-visit has been valuable and will speed your recovery.   Meds ordered this encounter  Medications   cephALEXin (KEFLEX) 500 MG capsule    Sig: Take 1 capsule (500 mg total) by mouth 2 (two) times daily for 7 days.    Dispense:  14 capsule    Refill:  0    I spent approximately 7 minutes reviewing the patient's history, current symptoms and coordinating their plan of care today.

## 2022-08-06 ENCOUNTER — Other Ambulatory Visit (HOSPITAL_BASED_OUTPATIENT_CLINIC_OR_DEPARTMENT_OTHER): Payer: Self-pay

## 2022-08-06 ENCOUNTER — Ambulatory Visit (INDEPENDENT_AMBULATORY_CARE_PROVIDER_SITE_OTHER): Payer: 59 | Admitting: Family Medicine

## 2022-08-06 ENCOUNTER — Encounter: Payer: Self-pay | Admitting: Family Medicine

## 2022-08-06 VITALS — BP 100/80 | HR 90 | Temp 98.0°F | Resp 18 | Ht 67.0 in | Wt 201.6 lb

## 2022-08-06 DIAGNOSIS — Z23 Encounter for immunization: Secondary | ICD-10-CM

## 2022-08-06 DIAGNOSIS — B354 Tinea corporis: Secondary | ICD-10-CM

## 2022-08-06 DIAGNOSIS — Z0000 Encounter for general adult medical examination without abnormal findings: Secondary | ICD-10-CM | POA: Diagnosis not present

## 2022-08-06 MED ORDER — CLOTRIMAZOLE-BETAMETHASONE 1-0.05 % EX CREA
1.0000 | TOPICAL_CREAM | Freq: Every day | CUTANEOUS | 0 refills | Status: DC
  Filled 2022-08-06: qty 30, 30d supply, fill #0

## 2022-08-06 NOTE — Patient Instructions (Signed)
Preventive Care 57-57 Years Old, Female Preventive care refers to lifestyle choices and visits with your health care provider that can promote health and wellness. Preventive care visits are also called wellness exams. What can I expect for my preventive care visit? Counseling Your health care provider may ask you questions about your: Medical history, including: Past medical problems. Family medical history. Pregnancy history. Current health, including: Menstrual cycle. Method of birth control. Emotional well-being. Home life and relationship well-being. Sexual activity and sexual health. Lifestyle, including: Alcohol, nicotine or tobacco, and drug use. Access to firearms. Diet, exercise, and sleep habits. Work and work environment. Sunscreen use. Safety issues such as seatbelt and bike helmet use. Physical exam Your health care provider will check your: Height and weight. These may be used to calculate your BMI (body mass index). BMI is a measurement that tells if you are at a healthy weight. Waist circumference. This measures the distance around your waistline. This measurement also tells if you are at a healthy weight and may help predict your risk of certain diseases, such as type 2 diabetes and high blood pressure. Heart rate and blood pressure. Body temperature. Skin for abnormal spots. What immunizations do I need?  Vaccines are usually given at various ages, according to a schedule. Your health care provider will recommend vaccines for you based on your age, medical history, and lifestyle or other factors, such as travel or where you work. What tests do I need? Screening Your health care provider may recommend screening tests for certain conditions. This may include: Lipid and cholesterol levels. Diabetes screening. This is done by checking your blood sugar (glucose) after you have not eaten for a while (fasting). Pelvic exam and Pap test. Hepatitis B test. Hepatitis C  test. HIV (human immunodeficiency virus) test. STI (sexually transmitted infection) testing, if you are at risk. Lung cancer screening. Colorectal cancer screening. Mammogram. Talk with your health care provider about when you should start having regular mammograms. This may depend on whether you have a family history of breast cancer. BRCA-related cancer screening. This may be done if you have a family history of breast, ovarian, tubal, or peritoneal cancers. Bone density scan. This is done to screen for osteoporosis. Talk with your health care provider about your test results, treatment options, and if necessary, the need for more tests. Follow these instructions at home: Eating and drinking  Eat a diet that includes fresh fruits and vegetables, whole grains, lean protein, and low-fat dairy products. Take vitamin and mineral supplements as recommended by your health care provider. Do not drink alcohol if: Your health care provider tells you not to drink. You are pregnant, may be pregnant, or are planning to become pregnant. If you drink alcohol: Limit how much you have to 0-1 drink a day. Know how much alcohol is in your drink. In the [place] one drink equals one 12 oz bottle of beer (355 mL), one 5 oz glass of wine (148 mL), or one 1 oz glass of hard liquor (44 mL). Lifestyle Brush your teeth every morning and night with fluoride toothpaste. Floss one time each day. Exercise for at least 30 minutes 5 or more days each week. Do not use any products that contain nicotine or tobacco. These products include cigarettes, chewing tobacco, and vaping devices, such as e-cigarettes. If you need help quitting, ask your health care provider. Do not use drugs. If you are sexually active, practice safe sex. Use a condom or other form of protection to   prevent STIs. If you do not wish to become pregnant, use a form of birth control. If you plan to become pregnant, see your health care provider for a  prepregnancy visit. Take aspirin only as told by your health care provider. Make sure that you understand how much to take and what form to take. Work with your health care provider to find out whether it is safe and beneficial for you to take aspirin daily. Find healthy ways to manage stress, such as: Meditation, yoga, or listening to music. Journaling. Talking to a trusted person. Spending time with friends and family. Minimize exposure to UV radiation to reduce your risk of skin cancer. Safety Always wear your seat belt while driving or riding in a vehicle. Do not drive: If you have been drinking alcohol. Do not ride with someone who has been drinking. When you are tired or distracted. While texting. If you have been using any mind-altering substances or drugs. Wear a helmet and other protective equipment during sports activities. If you have firearms in your house, make sure you follow all gun safety procedures. Seek help if you have been physically or sexually abused. What's next? Visit your health care provider once a year for an annual wellness visit. Ask your health care provider how often you should have your eyes and teeth checked. Stay up to date on all vaccines. This information is not intended to replace advice given to you by your health care provider. Make sure you discuss any questions you have with your health care provider. Document Revised: 05/10/2021 Document Reviewed: 05/10/2021 Elsevier Patient Education  [place].

## 2022-08-06 NOTE — Progress Notes (Signed)
Subjective:   By signing my name below, I, Sherry Jacobs, attest that this documentation has been prepared under the direction and in the presence of Sherry Jacobs 08/06/2022     Patient ID: Sherry Jacobs, female    DOB: [birth year], 56 y.o.   MRN: [id number]  No chief complaint on file.   HPI Patient is in today for a comprehensive physical exam   She reports that she went on vacation for labor day weekend and came back on 07/30/2022. She took a Covid-19 test on 07/31/2022 and 08/09/2022 and results were negative. Symptoms are improving.   She went to a dermatologist on 05/04/2022 and was diagnosed to have ringworms underneath her left arm. She was given 2% Ketoconazole for her symptoms. Her symptoms decreased but is not completely resolved.  She denies having any fever, new muscle pain, joint pain , new moles, congestion, sinus pain, sore throat, chest pain, palpations, cough, SOB ,wheezing,n/v/d constipation, blood in stool, dysuria, frequency, hematuria, at this time  She denies of any changes to her family medical history. She reports no recent surgeries.  Colonoscopy last completed on 04/25/2022 Pap Smear last completed on 06/30/2020 She reports that her last completed mammogram was in 01/2022 She is interested in receiving her tetanus vaccine during today's visit.   Past Medical History:  Diagnosis Date   ANA positive    Arthralgia    Autoimmune disease ([place])    ANA positive   Endometrial polyp    Hyperlipidemia    IBS (irritable bowel syndrome)    Iron deficiency anemia    Lactose intolerance in adult    PONV (postoperative nausea and vomiting)    Social anxiety disorder    Wears contact lenses     Past Surgical History:  Procedure Laterality Date   [place] N/A 10/28/2014   Procedure: [place];  Surgeon: Marylynn Pearson, MD;  Location: [facility];  Service: Gynecology;  Laterality: N/A;   RIGHT MODIFIED NECK DISSECTION W/ REMOVAL BENIGN RIGHT PARAPHARYNGEAL SPACE MASS AND LYMPH NODES  08-02-2004   benign bronchogenic cysts / negative lymph nodes    Family History  Problem Relation Age of Onset   Breast cancer Mother    Colon polyps Father    Melanoma Maternal Grandfather    Breast cancer Paternal Grandmother    Asthma Daughter        exercise induced    Asthma Son        exercise induced    Diabetes Other    Hypertension Other    Alcohol abuse Other    Hyperlipidemia Other    Arthritis Other    Stroke Other    Thyroid disease Other    Uterine cancer Other    Colon cancer Neg Hx    Esophageal cancer Neg Hx    Stomach cancer Neg Hx    Rectal cancer Neg Hx     Social History   Socioeconomic History   Marital status: Married    Spouse name: Not on file   Number of children: 2   Years of education: Not on file   Highest education level: Not on file  Occupational History   Occupation: nurse    Employer: [employer]  Tobacco Use   Smoking status: Never    Passive exposure: Never   Smokeless tobacco: Never  Vaping Use   Vaping Use: Never used  Substance  and Sexual Activity   Alcohol use: Yes    Comment: social    Drug use: Never   Sexual activity: Not on file  Other Topics Concern   Not on file  Social History Narrative   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      ALPRAZolam (XANAX) 0.25 MG tablet Take 1  tablet by mouth 2 times daily 180 tablet 0   ALPRAZolam (XANAX) 0.5 MG tablet Take 1 tablet by mouth twice a day as needed 180 tablet 0   hydroxychloroquine (PLAQUENIL) 200 MG tablet TAKE 1 TABLET BY MOUTH EVERY OTHER DAY. 45 tablet 0   ketoconazole (NIZORAL) 2 % cream Apply 1 application on to the skin once daily for 14 days 15 g 0   Multiple Vitamin (MULTIVITAMIN) tablet Take 1 tablet by mouth daily.     propranolol (INDERAL) 20 MG tablet Take 20 mg by mouth as needed.     propranolol (INDERAL) 20 MG tablet Take 1 tablet by mouth 3 times a day 270 tablet 0   rosuvastatin (CRESTOR) 10 MG tablet Take 1 tablet (10 mg total) by mouth daily. 90 tablet 1   No facility-administered medications prior to visit.    Allergies  Allergen Reactions   Sulfa Antibiotics Nausea And Vomiting and Nausea Only    Review of Systems  Constitutional:  Negative for fever.  HENT:  Negative for congestion, sinus pain and sore throat.   Respiratory:  Negative for cough, shortness of breath and wheezing.   Cardiovascular:  Negative for chest pain and palpitations.  Gastrointestinal:  Negative for blood in stool, constipation, diarrhea, nausea and vomiting.  Genitourinary:  Negative for dysuria, frequency and hematuria.  Musculoskeletal:  Negative for joint pain and myalgias.  Skin:        (-) New Moles       Objective:    Physical Exam Constitutional:      General: She is not in acute distress.    Appearance: Normal appearance. She is not ill-appearing.  HENT:     Head: Normocephalic and atraumatic.     Right Ear: Tympanic membrane, ear canal and external ear normal.     Left Ear: Tympanic membrane, ear canal and external ear normal.  Eyes:     Extraocular Movements: Extraocular movements intact.     Pupils: Pupils are equal, round, and reactive to light.  Cardiovascular:     Rate and Rhythm: Normal rate and regular rhythm.     Heart sounds: Normal heart sounds. No murmur heard.    No gallop.   Pulmonary:     Effort: Pulmonary effort is normal. No respiratory distress.     Breath sounds: Normal breath sounds. No wheezing or rales.  Abdominal:     General: Bowel sounds are normal. There is no distension.     Palpations: Abdomen is soft.  Tenderness: There is no abdominal tenderness. There is no guarding.  Skin:    General: Skin is warm and dry.  Neurological:     Mental Status: She is alert and oriented to person, place, and time.  Psychiatric:        Judgment: Judgment normal.     LMP 04/07/2017  Wt Readings from Last 3 Encounters:  04/25/22 195 lb (88.5 kg)  04/09/22 200 lb (90.7 kg)  01/12/22 195 lb (88.5 kg)    Diabetic Foot Exam - Simple   No data filed    Lab Results  Component Value Date   WBC 5.2 04/09/2022   HGB 13.7 04/09/2022   HCT 41.7 04/09/2022   PLT 255 04/09/2022   GLUCOSE 84 04/09/2022   CHOL 165 09/21/2021   TRIG 65.0 09/21/2021   HDL 55.60 09/21/2021   LDLDIRECT 137.7 07/31/2013   LDLCALC 97 09/21/2021   ALT 24 04/09/2022   AST 19 04/09/2022   NA 141 04/09/2022   K 4.1 04/09/2022   CL 104 04/09/2022   CREATININE 0.89 04/09/2022   BUN 18 04/09/2022   CO2 28 04/09/2022   TSH 4.85 06/05/2021    Lab Results  Component Value Date   TSH 4.85 06/05/2021   Lab Results  Component Value Date   WBC 5.2 04/09/2022   HGB 13.7 04/09/2022   HCT 41.7 04/09/2022   MCV 85.1 04/09/2022   PLT 255 04/09/2022   Lab Results  Component Value Date   NA 141 04/09/2022   K 4.1 04/09/2022   CO2 28 04/09/2022   GLUCOSE 84 04/09/2022   BUN 18 04/09/2022   CREATININE 0.89 04/09/2022   BILITOT 0.4 04/09/2022   ALKPHOS 69 09/21/2021   AST 19 04/09/2022   ALT 24 04/09/2022   PROT 7.1 04/09/2022   ALBUMIN 4.8 09/21/2021   CALCIUM 9.9 04/09/2022   ANIONGAP 9 08/04/2020   EGFR 76 04/09/2022   GFR 66.08 09/21/2021   Lab Results  Component Value Date   CHOL 165 09/21/2021   Lab Results  Component Value Date   HDL 55.60 09/21/2021    Lab Results  Component Value Date   LDLCALC 97 09/21/2021   Lab Results  Component Value Date   TRIG 65.0 09/21/2021   Lab Results  Component Value Date   CHOLHDL 3 09/21/2021   No results found for: "HGBA1C"     Assessment & Plan:   Problem List Items Addressed This Visit   None  No orders of the defined types were placed in this encounter.   I, Sherry Jacobs, personally preformed the services described in this documentation.  All medical record entries made by the scribe were at my direction and in my presence.  I have reviewed the chart and discharge instructions (if applicable) and agree that the record reflects my personal performance and is accurate and complete. 08/06/2022   I,Amber Collins,acting as a scribe for [employer], Jacobs.,have documented all relevant documentation on the behalf of Sherry Held, Jacobs,as directed by  Sherry Held, Jacobs while in the presence of Sherry Held, Jacobs.    [employer]

## 2022-08-07 DIAGNOSIS — B354 Tinea corporis: Secondary | ICD-10-CM | POA: Insufficient documentation

## 2022-08-07 NOTE — Assessment & Plan Note (Signed)
lotrisone cream

## 2022-08-07 NOTE — Assessment & Plan Note (Signed)
ghm utd Check labs  See avs  

## 2022-08-20 ENCOUNTER — Other Ambulatory Visit: Payer: 59

## 2022-08-28 ENCOUNTER — Encounter: Payer: Self-pay | Admitting: Family Medicine

## 2022-08-28 ENCOUNTER — Ambulatory Visit: Payer: 59 | Admitting: Family Medicine

## 2022-08-28 VITALS — BP 120/76 | HR 92 | Temp 98.0°F | Ht 67.0 in | Wt 201.2 lb

## 2022-08-28 DIAGNOSIS — I83892 Varicose veins of left lower extremities with other complications: Secondary | ICD-10-CM

## 2022-08-28 MED ORDER — NONFORMULARY OR COMPOUNDED ITEM: 0 refills | Status: DC

## 2022-08-28 NOTE — Assessment & Plan Note (Signed)
Compression stockings Refer to vascular  Venous doppler

## 2022-08-28 NOTE — Progress Notes (Addendum)
Subjective:   By signing my name below, I, Shehryar Baig, attest that this documentation has been prepared under the direction and in the presence of Ann Held, DO  08/28/2022     Patient ID: Sherry Jacobs, female    DOB: 1966-02-06, 56 y.o.   MRN: [id number]  Chief Complaint  Patient presents with   Groin Pain    On the left side. Felt like a razor burn.  Feels like the veins have uncomfortable     HPI Patient is in today for an office visit.  Patient is complaining of irritation in the groin area that began 2 weeks ago. At first it felt like scratching and began to move inwards. She denies any pain. Yesterday she reports swelling at the top of her left thigh. The area is more irritated when she is sitting and feels relief when she is standing or moving around. There is also a vein in the area that is causing concern for her. She is interested in receiving the influenza vaccine at her job.   Past Medical History:  Diagnosis Date   ANA positive    Arthralgia    Autoimmune disease ([place])    ANA positive   Endometrial polyp    Hyperlipidemia    IBS (irritable bowel syndrome)    Iron deficiency anemia    Lactose intolerance in adult    PONV (postoperative nausea and vomiting)    Social anxiety disorder    Wears contact lenses     Past Surgical History:  Procedure Laterality Date   [place] N/A 10/28/2014   Procedure: [place];  Surgeon: Marylynn Pearson, MD;  Location: [facility];  Service: Gynecology;  Laterality: N/A;   RIGHT MODIFIED NECK DISSECTION W/ REMOVAL BENIGN RIGHT PARAPHARYNGEAL SPACE MASS AND LYMPH NODES  08-02-2004   benign bronchogenic cysts / negative lymph nodes    Family History  Problem Relation Age of Onset   Breast cancer Mother    Colon polyps Father    Melanoma Maternal Grandfather    Breast cancer Paternal  Grandmother    Asthma Daughter        exercise induced    Asthma Son        exercise induced    Diabetes Other    Hypertension Other    Alcohol abuse Other    Hyperlipidemia Other    Arthritis Other    Stroke Other    Thyroid disease Other    Uterine cancer Other    Colon cancer Neg Hx    Esophageal cancer Neg Hx    Stomach cancer Neg Hx    Rectal cancer Neg Hx     Social History   Socioeconomic History   Marital status: Married    Spouse name: Not on file   Number of children: 2   Years of education: Not on file   Highest education level: Not on file  Occupational History   Occupation: nurse    Employer: [employer]  Tobacco Use   Smoking status: Never    Passive exposure: Never   Smokeless tobacco: Never  Vaping Use   Vaping Use: Never used  Substance and Sexual Activity   Alcohol use: Yes    Comment: social    Drug use: Never   Sexual activity: Not on file  Other Topics Concern   Not on file  Social History Narrative   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      clotrimazole-betamethasone (LOTRISONE) cream Apply to affected area(s) daily as directed 30 g 0   hydroxychloroquine (PLAQUENIL) 200 MG tablet TAKE 1 TABLET BY MOUTH EVERY OTHER DAY. 45 tablet 0   Multiple Vitamin (MULTIVITAMIN) tablet Take 1 tablet by mouth daily.     propranolol (INDERAL) 20 MG tablet Take 20 mg by mouth as needed.     rosuvastatin (CRESTOR) 10 MG tablet Take 1 tablet (10 mg total) by mouth daily.  90 tablet 1   ketoconazole (NIZORAL) 2 % cream Apply 1 application on to the skin once daily for 14 days 15 g 0   No facility-administered medications prior to visit.    Allergies  Allergen Reactions   Sulfa Antibiotics Nausea And Vomiting and Nausea Only    Review of Systems  Constitutional:  Negative for fever and malaise/fatigue.  HENT:  Negative for congestion.   Eyes:  Negative for blurred vision.  Respiratory:  Negative for cough and shortness of breath.   Cardiovascular:  Negative for chest pain, palpitations and leg swelling.  Gastrointestinal:  Negative for vomiting.  Musculoskeletal:  Positive for myalgias. Negative for back pain.  Skin:  Negative for rash.       (+)irritation around groin (+)swelling on top of left leg  Neurological:  Negative for loss of consciousness and headaches.       Objective:    Physical Exam Vitals and nursing note reviewed.  Constitutional:      General: She is not in acute distress.    Appearance: Normal appearance.  HENT:     Head: Normocephalic and atraumatic.     Right Ear: External ear normal.     Left Ear: External ear normal.  Eyes:     Extraocular Movements: Extraocular movements intact.     Pupils: Pupils are equal, round, and reactive to light.  Cardiovascular:     Rate and Rhythm: Normal rate and regular rhythm.     Heart sounds: Normal heart sounds. No murmur heard.    No gallop.  Pulmonary:     Effort: Pulmonary effort is normal. No respiratory distress.     Breath sounds: Normal breath sounds. No wheezing or rales.  Musculoskeletal:        General: Tenderness present.     Left lower leg: Tenderness present.       Legs:     Comments: + varicose vein from groin to medial thigh -- behind L knee Tender to touch Min swelling  No errythema   Skin:    General: Skin is warm and dry.  Neurological:     Mental Status: She is alert and oriented to person, place, and time.  Psychiatric:        Judgment: Judgment  normal.     BP 120/76   Pulse 92  Temp 98 F (36.7 C) (Oral)   Ht _0  (1.702 m)   Wt 201 lb 3.2 oz (91.3 kg)   LMP 04/07/2017   SpO2 98%   BMI 31.51 kg/m  Wt Readings from Last 3 Encounters:  08/28/22 201 lb 3.2 oz (91.3 kg)  08/06/22 201 lb 9.6 oz (91.4 kg)  04/25/22 195 lb (88.5 kg)    Diabetic Foot Exam - Simple   No data filed    Lab Results  Component Value Date   WBC 5.2 04/09/2022   HGB 13.7 04/09/2022   HCT 41.7 04/09/2022   PLT 255 04/09/2022   GLUCOSE 84 04/09/2022   CHOL 165 09/21/2021   TRIG 65.0 09/21/2021   HDL 55.60 09/21/2021   LDLDIRECT 137.7 07/31/2013   LDLCALC 97 09/21/2021   ALT 24 04/09/2022   AST 19 04/09/2022   NA 141 04/09/2022   K 4.1 04/09/2022   CL 104 04/09/2022   CREATININE 0.89 04/09/2022   BUN 18 04/09/2022   CO2 28 04/09/2022   TSH 4.85 06/05/2021    Lab Results  Component Value Date   TSH 4.85 06/05/2021   Lab Results  Component Value Date   WBC 5.2 04/09/2022   HGB 13.7 04/09/2022   HCT 41.7 04/09/2022   MCV 85.1 04/09/2022   PLT 255 04/09/2022   Lab Results  Component Value Date   NA 141 04/09/2022   K 4.1 04/09/2022   CO2 28 04/09/2022   GLUCOSE 84 04/09/2022   BUN 18 04/09/2022   CREATININE 0.89 04/09/2022   BILITOT 0.4 04/09/2022   ALKPHOS 69 09/21/2021   AST 19 04/09/2022   ALT 24 04/09/2022   PROT 7.1 04/09/2022   ALBUMIN 4.8 09/21/2021   CALCIUM 9.9 04/09/2022   ANIONGAP 9 08/04/2020   EGFR 76 04/09/2022   GFR 66.08 09/21/2021   Lab Results  Component Value Date   CHOL 165 09/21/2021   Lab Results  Component Value Date   HDL 55.60 09/21/2021   Lab Results  Component Value Date   LDLCALC 97 09/21/2021   Lab Results  Component Value Date   TRIG 65.0 09/21/2021   Lab Results  Component Value Date   CHOLHDL 3 09/21/2021   No results found for: "HGBA1C"     Assessment & Plan:   Problem List Items Addressed This Visit       Unprioritized   Varicose veins of left leg with  edema - Primary    Compression stockings Refer to vascular  Venous doppler       Relevant Medications   NONFORMULARY OR COMPOUNDED ITEM   Other Relevant Orders   US Venous Img Lower Unilateral Left (DVT)   Ambulatory referral to Vascular Surgery    Meds ordered this encounter  Medications   NONFORMULARY OR COMPOUNDED ITEM    Sig: Compression stockings  20-30 mm hg   #1  as directed    Dispense:  1 each    Refill:  0    I, Ann Held, DO, personally preformed the services described in this documentation.  All medical record entries made by the scribe were at my direction and in my presence.  I have reviewed the chart and discharge instructions (if applicable) and agree that the record reflects my personal performance and is accurate and complete. 08/28/2022   I,Shehryar Baig,acting as a scribe for Ann Held, DO.,have documented all relevant documentation on the behalf of Ann Held, DO,as directed by  Ann Held, DO while in the presence of Ann Held, DO.   Ann Held, DO

## 2022-08-28 NOTE — Progress Notes (Signed)
Office Visit Note  Patient: Sherry Jacobs             Date of Birth: [birth year]           MRN: [id number]             PCP: Ann Held, DO Referring: Ann Held, * Visit Date: 09/11/2022 Occupation: '@GUAROCC' @  Subjective:  Medication management  History of Present Illness: Sherry Jacobs is a 56 y.o. female with history of autoimmune disease and osteoarthritis.  She came off hydroxychloroquine since July 2023 as her disease was in remission.  She has not noticed any worsening of her symptoms.  She denies any history of oral ulcers, nasal ulcers, malar rash, photosensitivity, sicca symptoms, Raynaud's phenomenon, lymphadenopathy or inflammatory arthritis.  She continues to have some discomfort in her bilateral trochanteric region.  She has some stiffness in her hands due to underlying arthritis.  She has not noticed any joint swelling.  Activities of Daily Living:  Patient reports morning stiffness for 0 minutes.   Patient Denies nocturnal pain.  Difficulty dressing/grooming: Denies Difficulty climbing stairs: Denies Difficulty getting out of chair: Denies Difficulty using hands for taps, buttons, cutlery, and/or writing: Denies  Review of Systems  Constitutional:  Negative for fatigue.  HENT:  Negative for mouth sores and mouth dryness.   Eyes:  Negative for dryness.  Respiratory:  Negative for shortness of breath.   Cardiovascular:  Negative for chest pain and palpitations.  Gastrointestinal:  Negative for blood in stool, constipation and diarrhea.  Endocrine: Negative for increased urination.  Genitourinary:  Negative for involuntary urination.  Musculoskeletal:  Positive for myalgias and myalgias. Negative for joint pain, gait problem, joint pain, joint swelling, muscle weakness, morning stiffness and muscle tenderness.  Skin:  Negative for color change, rash, hair loss and sensitivity to sunlight.  Allergic/Immunologic: Negative for susceptible to  infections.  Neurological:  Negative for dizziness and headaches.  Hematological:  Negative for swollen glands.  Psychiatric/Behavioral:  Negative for depressed mood and sleep disturbance. The patient is not nervous/anxious.     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   LEG PAIN, LEFT 10/28/2008   ANXIETY 06/23/2008   UNSPECIFIED OSTEOPOROSIS 12/29/2007   HYPERLIPIDEMIA 10/02/2007   INJURY, 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 09/09/2007   Palpitations 09/09/2007   ANA POSITIVE, HX OF 09/09/2007   IRRITABLE BOWEL SYNDROME 04/11/2007   FIBROMYALGIA 04/11/2007    Past Medical History:  Diagnosis Date   ANA positive    Arthralgia    Autoimmune disease ([place])    ANA positive   Endometrial polyp    Hyperlipidemia    IBS (irritable bowel syndrome)    Iron deficiency anemia    Lactose intolerance in adult    PONV (postoperative nausea and vomiting)    Social anxiety disorder    Wears contact lenses     Family History  Problem Relation Age of Onset   Breast cancer Mother    Colon polyps Father    Melanoma Maternal Grandfather    Breast cancer Paternal Grandmother    Asthma Daughter  exercise induced    Asthma Son        exercise induced    Diabetes Other    Hypertension Other    Alcohol abuse Other    Hyperlipidemia Other    Arthritis Other    Stroke Other    Thyroid disease Other    Uterine cancer Other    Colon cancer Neg Hx    Esophageal cancer Neg Hx    Stomach cancer Neg Hx    Rectal cancer Neg Hx    Past Surgical History:  Procedure Laterality Date   APPENDECTOMY  1981    DILATATION & CURETTAGE/HYSTEROSCOPY WITH MYOSURE N/A 10/28/2014   Procedure: DILATATION & CURETTAGE/HYSTEROSCOPY WITH MYOSURE;  Surgeon: Marylynn Pearson, MD;  Location: [facility];  Service: Gynecology;  Laterality: N/A;   RIGHT MODIFIED NECK DISSECTION W/ REMOVAL BENIGN RIGHT PARAPHARYNGEAL SPACE MASS AND LYMPH NODES  08-02-2004   benign bronchogenic cysts / negative lymph nodes   Social History   Social History Narrative   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   Living at home with husband and two kids                        Immunization History  Administered Date(s) Administered   H1N1 10/13/2008   Influenza Whole 11/26/2004, 09/15/2008, 08/30/2010   Influenza-Unspecified 08/27/2011, 08/26/2016, 09/05/2019, 09/04/2020   Moderna Covid-19 Vaccine Bivalent Booster 55yr & up 11/22/2021   PFIZER(Purple Top)SARS-COV-2 Vaccination 11/17/2019, 12/08/2019   Pneumococcal Polysaccharide-23 05/11/2011   Td 12/15/2008   Tdap 08/06/2022   Unspecified SARS-COV-2 Vaccination 11/02/2020   Varicella 08/07/2021   Zoster Recombinat (Shingrix) 06/06/2021, 08/08/2021     Objective: Vital Signs: BP 113/76 (BP Location: Left Arm, Patient Position: Sitting, Cuff Size: Normal)   Pulse 88   Resp 16   Ht '5\' 7"'  (1.702 m)   Wt 200 lb 9.6 oz (91 kg)   LMP 04/07/2017   BMI 31.42 kg/m    Physical Exam Vitals and nursing note reviewed.  Constitutional:      Appearance: She is well-developed.  HENT:     Head: Normocephalic and atraumatic.  Eyes:     Conjunctiva/sclera: Conjunctivae normal.  Cardiovascular:     Rate and Rhythm: Normal rate and regular rhythm.     Heart sounds: Normal heart sounds.  Pulmonary:     Effort: Pulmonary effort is normal.     Breath sounds: Normal breath sounds.  Abdominal:     General: Bowel sounds are normal.     Palpations: Abdomen is soft.  Musculoskeletal:     Cervical back: Normal range of motion.  Lymphadenopathy:     Cervical: No cervical  adenopathy.  Skin:    General: Skin is warm and dry.     Capillary Refill: Capillary refill takes less than 2 seconds.  Neurological:     Mental Status: She is alert and oriented to person, place, and time.  Psychiatric:        Behavior: Behavior normal.      Musculoskeletal Exam: Cervical, thoracic and lumbar spine were in good range of motion.  Shoulders, elbows, wrist joints, MCPs PIPs and DIPs been good range of motion with no synovitis.  She had mild PIP and DIP thickening bilaterally with no synovitis.  Hip joints were in good range of motion.  She had good range of motion of bilateral knee joints without any warmth swelling or effusion.  There was no tenderness over ankles or MTPs.  CDAI Exam: CDAI Score: -- Patient Global: --; Provider Global: -- Swollen: --; Tender: -- Joint Exam 09/11/2022   No joint exam has been documented for this visit   There is currently no information documented on the homunculus. Go to the Rheumatology activity and complete the homunculus joint exam.  Investigation: No additional findings.  Imaging: US Venous Img Lower Unilateral Left (DVT)  Result Date: 08/31/2022 CLINICAL DATA:  Left lower extremity pain and edema. Evaluate for DVT. EXAM: LEFT LOWER EXTREMITY VENOUS DOPPLER ULTRASOUND TECHNIQUE: Gray-scale sonography with graded compression, as well as color Doppler and duplex ultrasound were performed to evaluate the lower extremity deep venous systems from the level of the common femoral vein and including the common femoral, femoral, profunda femoral, popliteal and calf veins including the posterior tibial, peroneal and gastrocnemius veins when visible. The superficial great saphenous vein was also interrogated. Spectral Doppler was utilized to evaluate flow at rest and with distal augmentation maneuvers in the common femoral, femoral and popliteal veins. COMPARISON:  None Available. FINDINGS: Contralateral Common Femoral Vein: Respiratory  phasicity is normal and symmetric with the symptomatic side. No evidence of thrombus. Normal compressibility. Common Femoral Vein: No evidence of thrombus. Normal compressibility, respiratory phasicity and response to augmentation. Saphenofemoral Junction: No evidence of thrombus. Normal compressibility and flow on color Doppler imaging. Profunda Femoral Vein: No evidence of thrombus. Normal compressibility and flow on color Doppler imaging. Femoral Vein: No evidence of thrombus. Normal compressibility, respiratory phasicity and response to augmentation. Popliteal Vein: No evidence of thrombus. Normal compressibility, respiratory phasicity and response to augmentation. Calf Veins: No evidence of thrombus. Normal compressibility and flow on color Doppler imaging. Superficial Great Saphenous Vein: No evidence of thrombus. Normal compressibility. Other Findings:  None. IMPRESSION: No evidence of DVT within the left lower extremity. Electronically Signed   By: Sandi Mariscal M.D.   On: 08/31/2022 14:51    Recent Labs: Lab Results  Component Value Date   WBC 5.2 04/09/2022   HGB 13.7 04/09/2022   PLT 255 04/09/2022   NA 141 04/09/2022   K 4.1 04/09/2022   CL 104 04/09/2022   CO2 28 04/09/2022   GLUCOSE 84 04/09/2022   BUN 18 04/09/2022   CREATININE 0.89 04/09/2022   BILITOT 0.4 04/09/2022   ALKPHOS 69 09/21/2021   AST 19 04/09/2022   ALT 24 04/09/2022   PROT 7.1 04/09/2022   ALBUMIN 4.8 09/21/2021   CALCIUM 9.9 04/09/2022   GFRAA 88 01/10/2021    DEXA report from [facility] T score -0.1 left femoral neck August 22, 2021  Speciality Comments: [facility] Eye Exam: 10/27/2021 WNL @ [facility],  Procedures:  No procedures performed Allergies: Sulfa antibiotics   Assessment / Plan:     Visit Diagnoses: Autoimmune disease ([place]) - +ANA, low C3, arthralgia, fatigue, Sicca symptoms, and Raynaud's: -She has been off hydroxychloroquine since May 2023.  She has not noticed any worsening  of her symptoms.  She denies any history of fatigue, malar rash, oral ulcers, nasal ulcers, sicca symptoms, Raynaud's phenomenon, inflammatory arthritis or lymphadenopathy.  Her serology has been negative for many years.  We will check labs today.  Plan: Protein / creatinine ratio, urine, Anti-DNA antibody, double-stranded, C3 and C4, Sedimentation rate, ANA.  I advised her to contact me if she develops any new symptoms.  High risk medication use -she discontinued hydroxychloroquine in May 2023.  (dsDNA-, complements WNL, and ESR WNL). [facility] Eye Exam: 10/27/2021  - Plan: CBC with Differential/Platelet, COMPLETE METABOLIC  PANEL WITH GFR  Raynaud's phenomenon-Currently not symptomatic.  Sicca syndrome ([facility])-she denies and dry mouth and dry eyes.  She notices dry eyes only when she wears contacts.  She states that she wear contacts only for social events.  Primary osteoarthritis of both hands-she has some stiffness in her hands.  No synovitis was noted.  Trochanteric bursitis of both hips-she cannot history of some discomfort in the trochanteric region.  IT band stretches were discussed and demonstrated.  A handout on IT band stretches was given.  Vitamin D deficiency - She takes Vit D 1000 U daily with MVI  History of anxiety-situational.  She takes Xanax and propanolol as needed.  History of hyperlipidemia-she is on Crestor.  Patient states that she does not have any features of fibromyalgia or IBS.  Orders: Orders Placed This Encounter  Procedures   Protein / creatinine ratio, urine   CBC with Differential/Platelet   COMPLETE METABOLIC PANEL WITH GFR   Anti-DNA antibody, double-stranded   C3 and C4   Sedimentation rate   ANA   No orders of the defined types were placed in this encounter.    Follow-Up Instructions: Return in about 6 months (around 03/13/2023) for Autoimmune disease, Osteoarthritis.   Bo Merino, MD  Note - This record has been created using Editor, commissioning.   Chart creation errors have been sought, but may not always  have been located. Such creation errors do not reflect on  the standard of medical care.

## 2022-08-29 DIAGNOSIS — H5203 Hypermetropia, bilateral: Secondary | ICD-10-CM | POA: Diagnosis not present

## 2022-08-29 DIAGNOSIS — H524 Presbyopia: Secondary | ICD-10-CM | POA: Diagnosis not present

## 2022-08-31 ENCOUNTER — Ambulatory Visit (HOSPITAL_BASED_OUTPATIENT_CLINIC_OR_DEPARTMENT_OTHER)
Admission: RE | Admit: 2022-08-31 | Discharge: 2022-08-31 | Disposition: A | Discharge status: Home / Self Care | Payer: 59 | Source: Ambulatory Visit | Attending: Family Medicine | Admitting: Family Medicine

## 2022-08-31 DIAGNOSIS — I83892 Varicose veins of left lower extremities with other complications: Secondary | ICD-10-CM

## 2022-08-31 DIAGNOSIS — M79605 Pain in left leg: Secondary | ICD-10-CM | POA: Diagnosis not present

## 2022-09-03 ENCOUNTER — Other Ambulatory Visit: Payer: 59

## 2022-09-05 DIAGNOSIS — Z124 Encounter for screening for malignant neoplasm of cervix: Secondary | ICD-10-CM | POA: Diagnosis not present

## 2022-09-05 DIAGNOSIS — Z01419 Encounter for gynecological examination (general) (routine) without abnormal findings: Secondary | ICD-10-CM | POA: Diagnosis not present

## 2022-09-05 DIAGNOSIS — Z6831 Body mass index (BMI) 31.0-31.9, adult: Secondary | ICD-10-CM | POA: Diagnosis not present

## 2022-09-05 DIAGNOSIS — Z1151 Encounter for screening for human papillomavirus (HPV): Secondary | ICD-10-CM | POA: Diagnosis not present

## 2022-09-11 ENCOUNTER — Ambulatory Visit: Payer: 59 | Attending: Rheumatology | Admitting: Rheumatology

## 2022-09-11 ENCOUNTER — Encounter: Payer: Self-pay | Admitting: Rheumatology

## 2022-09-11 VITALS — BP 113/76 | HR 88 | Resp 16 | Ht 67.0 in | Wt 200.6 lb

## 2022-09-11 DIAGNOSIS — M7062 Trochanteric bursitis, left hip: Secondary | ICD-10-CM

## 2022-09-11 DIAGNOSIS — M7061 Trochanteric bursitis, right hip: Secondary | ICD-10-CM

## 2022-09-11 DIAGNOSIS — Z8639 Personal history of other endocrine, nutritional and metabolic disease: Secondary | ICD-10-CM | POA: Diagnosis not present

## 2022-09-11 DIAGNOSIS — I7300 Raynaud's syndrome without gangrene: Secondary | ICD-10-CM | POA: Diagnosis not present

## 2022-09-11 DIAGNOSIS — M19042 Primary osteoarthritis, left hand: Secondary | ICD-10-CM

## 2022-09-11 DIAGNOSIS — R5383 Other fatigue: Secondary | ICD-10-CM

## 2022-09-11 DIAGNOSIS — Z79899 Other long term (current) drug therapy: Secondary | ICD-10-CM

## 2022-09-11 DIAGNOSIS — Z8659 Personal history of other mental and behavioral disorders: Secondary | ICD-10-CM | POA: Diagnosis not present

## 2022-09-11 DIAGNOSIS — M3500 Sicca syndrome, unspecified: Secondary | ICD-10-CM | POA: Diagnosis not present

## 2022-09-11 DIAGNOSIS — M359 Systemic involvement of connective tissue, unspecified: Secondary | ICD-10-CM | POA: Diagnosis not present

## 2022-09-11 DIAGNOSIS — Z8719 Personal history of other diseases of the digestive system: Secondary | ICD-10-CM

## 2022-09-11 DIAGNOSIS — M19041 Primary osteoarthritis, right hand: Secondary | ICD-10-CM

## 2022-09-11 DIAGNOSIS — E559 Vitamin D deficiency, unspecified: Secondary | ICD-10-CM

## 2022-09-11 DIAGNOSIS — M797 Fibromyalgia: Secondary | ICD-10-CM

## 2022-09-11 NOTE — Patient Instructions (Signed)
Iliotibial Band Syndrome Rehab Ask your health care provider which exercises are safe for you. Do exercises exactly as told by your health care provider and adjust them as directed. It is normal to feel mild stretching, pulling, tightness, or discomfort as you do these exercises. Stop right away if you feel sudden pain or your pain gets significantly worse. Do not begin these exercises until told by your health care provider. Stretching and range-of-motion exercises These exercises warm up your muscles and joints and improve the movement and flexibility of your hip and pelvis. Quadriceps stretch, prone  Lie on your abdomen (prone position) on a firm surface, such as a bed or padded floor. Bend your left / right knee and reach back to hold your ankle or pant leg. If you cannot reach your ankle or pant leg, loop a belt around your foot and grab the belt instead. Gently pull your heel toward your buttocks. Your knee should not slide out to the side. You should feel a stretch in the front of your thigh and knee (quadriceps). Hold this position for __________ seconds. Repeat __________ times. Complete this exercise __________ times a day. Iliotibial band stretch An iliotibial band is a strong band of muscle tissue that runs from the outer side of your hip to the outer side of your thigh and knee. Lie on your side with your left / right leg in the top position. Bend both of your knees and grab your left / right ankle. Stretch out your bottom arm to help you balance. Slowly bring your top knee back so your thigh goes behind your trunk. Slowly lower your top leg toward the floor until you feel a gentle stretch on the outside of your left / right hip and thigh. If you do not feel a stretch and your knee will not fall farther, place the heel of your other foot on top of your knee and pull your knee down toward the floor with your foot. Hold this position for __________ seconds. Repeat __________ times.  Complete this exercise __________ times a day. Strengthening exercises These exercises build strength and endurance in your hip and pelvis. Endurance is the ability to use your muscles for a long time, even after they get tired. Straight leg raises, side-lying This exercise strengthens the muscles that rotate the leg at the hip and move it away from your body (hip abductors). Lie on your side with your left / right leg in the top position. Lie so your head, shoulder, hip, and knee line up. You may bend your bottom knee to help you balance. Roll your hips slightly forward so your hips are stacked directly over each other and your left / right knee is facing forward. Tense the muscles in your outer thigh and lift your top leg 4-6 inches (10-15 cm). Hold this position for __________ seconds. Slowly lower your leg to return to the starting position. Let your muscles relax completely before doing another repetition. Repeat __________ times. Complete this exercise __________ times a day. Leg raises, prone This exercise strengthens the muscles that move the hips backward (hip extensors). Lie on your abdomen (prone position) on your bed or a firm surface. You can put a pillow under your hips if that is more comfortable for your lower back. Bend your left / right knee so your foot is straight up in the air. Squeeze your buttocks muscles and lift your left / right thigh off the bed. Do not let your back arch. Tense   your thigh muscle as hard as you can without increasing any knee pain. Hold this position for __________ seconds. Slowly lower your leg to return to the starting position and allow it to relax completely. Repeat __________ times. Complete this exercise __________ times a day. Hip hike Stand sideways on a bottom step. Stand on your left / right leg with your other foot unsupported next to the step. You can hold on to a railing or wall for balance if needed. Keep your knees straight and your  torso square. Then lift your left / right hip up toward the ceiling. Slowly let your left / right hip lower toward the floor, past the starting position. Your foot should get closer to the floor. Do not lean or bend your knees. Repeat __________ times. Complete this exercise __________ times a day. This information is not intended to replace advice given to you by your health care provider. Make sure you discuss any questions you have with your health care provider. Document Revised: 01/20/2020 Document Reviewed: 01/20/2020 Elsevier Patient Education  2023 [employer] Inc.  

## 2022-09-13 LAB — CBC WITH DIFFERENTIAL/PLATELET
Absolute Monocytes: 400 cells/uL (ref 200–950)
Basophils Absolute: 32 cells/uL (ref 0–200)
Basophils Relative: 0.6 %
Eosinophils Absolute: 97 cells/uL (ref 15–500)
Eosinophils Relative: 1.8 %
HCT: 40.7 % (ref 35.0–45.0)
Hemoglobin: 13.7 g/dL (ref 11.7–15.5)
Lymphs Abs: 1787 cells/uL (ref 850–3900)
MCH: 28.1 pg (ref 27.0–33.0)
MCHC: 33.7 g/dL (ref 32.0–36.0)
MCV: 83.6 fL (ref 80.0–100.0)
MPV: 11.3 fL (ref 7.5–12.5)
Monocytes Relative: 7.4 %
Neutro Abs: 3083 cells/uL (ref 1500–7800)
Neutrophils Relative %: 57.1 %
Platelets: 257 10*3/uL (ref 140–400)
RBC: 4.87 10*6/uL (ref 3.80–5.10)
RDW: 13.1 % (ref 11.0–15.0)
Total Lymphocyte: 33.1 %
WBC: 5.4 10*3/uL (ref 3.8–10.8)

## 2022-09-13 LAB — ANTI-NUCLEAR AB-TITER (ANA TITER): ANA Titer 1: 1:320 {titer} — ABNORMAL HIGH

## 2022-09-13 LAB — COMPLETE METABOLIC PANEL WITH GFR
AG Ratio: 1.8 (calc) (ref 1.0–2.5)
ALT: 24 U/L (ref 6–29)
AST: 19 U/L (ref 10–35)
Albumin: 4.6 g/dL (ref 3.6–5.1)
Alkaline phosphatase (APISO): 66 U/L (ref 37–153)
BUN: 22 mg/dL (ref 7–25)
CO2: 26 mmol/L (ref 20–32)
Calcium: 9.8 mg/dL (ref 8.6–10.4)
Chloride: 105 mmol/L (ref 98–110)
Creat: 0.99 mg/dL (ref 0.50–1.03)
Globulin: 2.5 g/dL (calc) (ref 1.9–3.7)
Glucose, Bld: 124 mg/dL — ABNORMAL HIGH (ref 65–99)
Potassium: 4 mmol/L (ref 3.5–5.3)
Sodium: 142 mmol/L (ref 135–146)
Total Bilirubin: 0.4 mg/dL (ref 0.2–1.2)
Total Protein: 7.1 g/dL (ref 6.1–8.1)
eGFR: 67 mL/min/{1.73_m2} (ref >=60)

## 2022-09-13 LAB — PROTEIN / CREATININE RATIO, URINE
Creatinine, Urine: 169 mg/dL (ref 20–275)
Protein/Creat Ratio: 59 mg/g creat (ref 24–184)
Protein/Creatinine Ratio: 0.059 mg/mg creat (ref 0.024–0.184)
Total Protein, Urine: 10 mg/dL (ref 5–24)

## 2022-09-13 LAB — C3 AND C4
C3 Complement: 149 mg/dL (ref 83–193)
C4 Complement: 37 mg/dL (ref 15–57)

## 2022-09-13 LAB — ANA: Anti Nuclear Antibody (ANA): POSITIVE — AB

## 2022-09-13 LAB — SEDIMENTATION RATE: Sed Rate: 6 mm/h (ref 0–30)

## 2022-09-13 LAB — ANTI-DNA ANTIBODY, DOUBLE-STRANDED: ds DNA Ab: <1 IU/mL

## 2022-09-13 NOTE — Progress Notes (Signed)
CBC normal, CMP normal except glucose is mildly elevated probably not a fasting sample, anti-DNA negative, complements normal, sed rate normal urine protein negative, ANA 1: 320 stable Labs do not indicate an autoimmune disease flare.

## 2022-10-03 ENCOUNTER — Other Ambulatory Visit: Payer: Self-pay | Admitting: *Deleted

## 2022-10-03 DIAGNOSIS — I83892 Varicose veins of left lower extremities with other complications: Secondary | ICD-10-CM

## 2022-10-09 DIAGNOSIS — N393 Stress incontinence (female) (male): Secondary | ICD-10-CM | POA: Diagnosis not present

## 2022-10-09 DIAGNOSIS — K5900 Constipation, unspecified: Secondary | ICD-10-CM | POA: Diagnosis not present

## 2022-10-09 NOTE — Progress Notes (Unsigned)
Requested by:  [place], DO [address] [place],  [place] [address]  Reason for consultation: varicose veins  LLE    History of Present Illness   Sherry Jacobs is a 56 y.o. ([birth year]) female who presents for evaluation of varicose veins in left leg. She says that about 1 month ago she had all of the sudden developed discomfort like a raw burning feeling along medial left thigh and her medial thigh and groin area appeared slightly swollen. This lasted for about 2 weeks. At the time she saw her PCP who recommended evaluation of her veins. She had a DVT study performed that showed no blood clot. Mrs. Acocella explains that prior to this she did not have any symptoms in either leg. She has had some visible varicose veins in her left leg she says for approximately 20 years. These have never bothered her. She also has some scattered spider veins. She denies any burning, itching, stinging, aching, or throbbing. She does not have any swelling. No bleeding or ulceration. She has never worn compression stockings. She does not regularly elevate her legs. She does sit a lot for her work but she tries to use little foot peddle machine while at work. She reports no history of DVT. She does explain that her sister has very bad varicose veins and has had procedures done for them.  Venous symptoms include: burning, varicose veins Onset/duration:  > 1 month  Occupation:  RN Aggravating factors: sitting Alleviating factors: moving, standing Compression:  no Helps:  unsure Pain medications:  none Previous vein procedures:  No History of DVT:  No  Past Medical History:  Diagnosis Date   ANA positive    Arthralgia    Autoimmune disease (HCC)    ANA positive   Endometrial polyp    Hyperlipidemia    IBS (irritable bowel syndrome)    Iron deficiency anemia    Lactose intolerance in adult    PONV (postoperative nausea and vomiting)    Social anxiety disorder    Wears  contact lenses     Past Surgical History:  Procedure Laterality Date   [place] N/A 10/28/2014   Procedure: DILATATION & CURETTAGE/HYSTEROSCOPY WITH MYOSURE;  Surgeon: Marylynn Pearson, MD;  Location: [facility];  Service: Gynecology;  Laterality: N/A;   RIGHT MODIFIED NECK DISSECTION W/ REMOVAL BENIGN RIGHT PARAPHARYNGEAL SPACE MASS AND LYMPH NODES  08-02-2004   benign bronchogenic cysts / negative lymph nodes    Social History   Socioeconomic History   Marital status: Married    Spouse name: Not on file   Number of children: 2   Years of education: Not on file   Highest education level: Not on file  Occupational History   Occupation: nurse    Employer: [employer]  Tobacco Use   Smoking status: Never    Passive exposure: Never   Smokeless tobacco: Never  Vaping Use   Vaping Use: Never used  Substance and Sexual Activity   Alcohol use: Yes    Comment: social    Drug use: Never   Sexual activity: Not on file  Other Topics Concern   Not on file  Social History Narrative   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   Living at home with husband and two kids  Social Determinants of Health   Financial Resource Strain: Not on file  Food Insecurity: Not on file  Transportation Needs: Not on file  Physical Activity: Not on file  Stress: Not on file  Social Connections: Not on file  Intimate Partner Violence: Not on file    Family History  Problem Relation Age of Onset   Breast cancer Mother    Colon polyps Father    Melanoma Maternal Grandfather    Breast cancer Paternal Grandmother    Asthma Daughter        exercise induced    Asthma Son        exercise induced    Diabetes Other    Hypertension Other    Alcohol abuse Other    Hyperlipidemia Other    Arthritis Other    Stroke Other    Thyroid disease Other    Uterine cancer Other    Colon cancer Neg Hx     Esophageal cancer Neg Hx    Stomach cancer Neg Hx    Rectal cancer Neg Hx     Current Outpatient Medications  Medication Sig Dispense Refill   ALPRAZolam (XANAX) 0.25 MG tablet Take 0.25 mg by mouth as needed for anxiety.      Multiple Vitamin (MULTIVITAMIN) tablet Take 1 tablet by mouth daily.     propranolol (INDERAL) 20 MG tablet Take 20 mg by mouth as needed.     rosuvastatin (CRESTOR) 10 MG tablet Take 1 tablet (10 mg total) by mouth daily. 90 tablet 1   No current facility-administered medications for this visit.    Allergies  Allergen Reactions   Sulfa Antibiotics Nausea And Vomiting and Nausea Only    REVIEW OF SYSTEMS (negative unless checked):   Cardiac:  '[]'$  Chest pain or chest pressure? '[]'$  Shortness of breath upon activity? '[]'$  Shortness of breath when lying flat? '[]'$  Irregular heart rhythm?  Vascular:  '[]'$  Pain in calf, thigh, or hip brought on by walking? '[]'$  Pain in feet at night that wakes you up from your sleep? '[]'$  Blood clot in your veins? '[]'$  Leg swelling?  Pulmonary:  '[]'$  Oxygen at home? '[]'$  Productive cough? '[]'$  Wheezing?  Neurologic:  '[]'$  Sudden weakness in arms or legs? '[]'$  Sudden numbness in arms or legs? '[]'$  Sudden onset of difficult speaking or slurred speech? '[]'$  Temporary loss of vision in one eye? '[]'$  Problems with dizziness?  Gastrointestinal:  '[]'$  Blood in stool? '[]'$  Vomited blood?  Genitourinary:  '[]'$  Burning when urinating? '[]'$  Blood in urine?  Psychiatric:  '[]'$  Major depression  Hematologic:  '[]'$  Bleeding problems? '[]'$  Problems with blood clotting?  Dermatologic:  '[]'$  Rashes or ulcers?  Constitutional:  '[]'$  Fever or chills?  Ear/Nose/Throat:  '[]'$  Change in hearing? '[]'$  Nose bleeds? '[]'$  Sore throat?  Musculoskeletal:  '[]'$  Back pain? '[]'$  Joint pain? '[]'$  Muscle pain?   Physical Examination     Vitals:   10/10/22 1333  BP: 107/72  Pulse: 80  Resp: 20  Temp: 97.6 F (36.4 C)  TempSrc: Temporal  SpO2: 98%  Weight: 204 lb 6.4 oz  (92.7 kg)  Height: '5\' 7"'$  (1.702 m)   Body mass index is 32.01 kg/m.  General:  WDWN in NAD; vital signs documented above Gait: Normal HENT: WNL, normocephalic Pulmonary: normal non-labored breathing Cardiac: regular HR Vascular Exam/Pulses: BLE well perfused and warm Extremities: with varicose vein of medial left thigh that traverses down to popliteal fossa and proximal medial- posterior left calf, with reticular veins right medial knee  and spider veins on both lower legs, without edema, without stasis pigmentation, without lipodermatosclerosis, without ulcers Musculoskeletal: no muscle wasting or atrophy  Neurologic: A&O X 3;  No focal weakness or paresthesias are detected Psychiatric:  The pt has Normal affect.  Non-invasive Vascular Imaging   LLE DVT Duplex (08/31/22):  No evidence of DVT in the left lower extremity   Medical Decision Making   KAYANN MAJ is a 56 y.o. female who presents with varicose vein LLE. Had some acute symptoms in October, which I suspect may have been thrombophlebitis in the Left thigh. This has resolved. She otherwise does not have symptoms from her varicose veins.Patient recently had DVT duplex ordered by PCP on 08/31/22 that showed no DVT in the LLE. The SFJ was observed on duplex and there was reflux present. Patient elected to not have full reflux duplex today because she was under impression this was the same ultrasound that she already had. Despite explaining that it is different she preferred to not have it done. Based on the patient's history and examination, I recommend conservative therapy with daily elevation, knee high compression 15-20 mmHg, refraining from prolonged sitting or standing. She was measured and purchased stockings at today's visit. We agreed that for now would do conservative therapy and if she has any new or worsening symptoms she will follow up and have reflux duplex at that time. At this time she will follow up as needed.     Karoline Caldwell, PA-C [facility] Office: [phone]  10/10/2022, 1:37 PM  Clinic MD: [facility]/ Donzetta Matters

## 2022-10-10 ENCOUNTER — Ambulatory Visit (INDEPENDENT_AMBULATORY_CARE_PROVIDER_SITE_OTHER): Payer: 59 | Admitting: Physician Assistant

## 2022-10-10 ENCOUNTER — Ambulatory Visit (HOSPITAL_COMMUNITY): Admission: RE | Admit: 2022-10-10 | Payer: 59 | Source: Ambulatory Visit

## 2022-10-10 VITALS — BP 107/72 | HR 80 | Temp 97.6°F | Resp 20 | Ht 67.0 in | Wt 204.4 lb

## 2022-10-10 DIAGNOSIS — M7989 Other specified soft tissue disorders: Secondary | ICD-10-CM

## 2022-10-10 DIAGNOSIS — I83892 Varicose veins of left lower extremities with other complications: Secondary | ICD-10-CM | POA: Diagnosis not present

## 2022-10-16 DIAGNOSIS — M62838 Other muscle spasm: Secondary | ICD-10-CM | POA: Diagnosis not present

## 2022-10-16 DIAGNOSIS — N819 Female genital prolapse, unspecified: Secondary | ICD-10-CM | POA: Diagnosis not present

## 2022-10-16 DIAGNOSIS — N8110 Cystocele, unspecified: Secondary | ICD-10-CM | POA: Diagnosis not present

## 2022-10-16 DIAGNOSIS — M6281 Muscle weakness (generalized): Secondary | ICD-10-CM | POA: Diagnosis not present

## 2022-10-22 ENCOUNTER — Other Ambulatory Visit: Payer: Self-pay | Admitting: Family Medicine

## 2022-10-22 ENCOUNTER — Other Ambulatory Visit (HOSPITAL_BASED_OUTPATIENT_CLINIC_OR_DEPARTMENT_OTHER): Payer: Self-pay

## 2022-10-22 ENCOUNTER — Encounter: Payer: Self-pay | Admitting: Family Medicine

## 2022-10-22 MED ORDER — ROSUVASTATIN CALCIUM 10 MG PO TABS
10.0000 mg | ORAL_TABLET | Freq: Every day | ORAL | 1 refills | Status: DC
  Filled 2022-10-22: qty 90, 90d supply, fill #0
  Filled 2023-01-17: qty 90, 90d supply, fill #1

## 2022-10-23 ENCOUNTER — Other Ambulatory Visit (HOSPITAL_BASED_OUTPATIENT_CLINIC_OR_DEPARTMENT_OTHER): Payer: Self-pay

## 2022-10-23 DIAGNOSIS — N760 Acute vaginitis: Secondary | ICD-10-CM | POA: Diagnosis not present

## 2022-10-23 DIAGNOSIS — N950 Postmenopausal bleeding: Secondary | ICD-10-CM | POA: Diagnosis not present

## 2022-10-24 ENCOUNTER — Other Ambulatory Visit (HOSPITAL_COMMUNITY): Payer: Self-pay

## 2022-10-24 ENCOUNTER — Other Ambulatory Visit (HOSPITAL_BASED_OUTPATIENT_CLINIC_OR_DEPARTMENT_OTHER): Payer: Self-pay

## 2022-10-30 DIAGNOSIS — N8110 Cystocele, unspecified: Secondary | ICD-10-CM | POA: Diagnosis not present

## 2022-10-30 DIAGNOSIS — M62838 Other muscle spasm: Secondary | ICD-10-CM | POA: Diagnosis not present

## 2022-10-30 DIAGNOSIS — N819 Female genital prolapse, unspecified: Secondary | ICD-10-CM | POA: Diagnosis not present

## 2022-10-30 DIAGNOSIS — M6281 Muscle weakness (generalized): Secondary | ICD-10-CM | POA: Diagnosis not present

## 2022-11-02 DIAGNOSIS — N950 Postmenopausal bleeding: Secondary | ICD-10-CM | POA: Diagnosis not present

## 2022-11-15 DIAGNOSIS — M6281 Muscle weakness (generalized): Secondary | ICD-10-CM | POA: Diagnosis not present

## 2022-11-15 DIAGNOSIS — N819 Female genital prolapse, unspecified: Secondary | ICD-10-CM | POA: Diagnosis not present

## 2022-11-15 DIAGNOSIS — N8110 Cystocele, unspecified: Secondary | ICD-10-CM | POA: Diagnosis not present

## 2022-11-15 DIAGNOSIS — M62838 Other muscle spasm: Secondary | ICD-10-CM | POA: Diagnosis not present

## 2022-12-04 DIAGNOSIS — N393 Stress incontinence (female) (male): Secondary | ICD-10-CM | POA: Diagnosis not present

## 2022-12-04 DIAGNOSIS — K5900 Constipation, unspecified: Secondary | ICD-10-CM | POA: Diagnosis not present

## 2022-12-06 ENCOUNTER — Telehealth: Payer: 59 | Admitting: Physician Assistant

## 2022-12-06 ENCOUNTER — Other Ambulatory Visit (HOSPITAL_COMMUNITY): Payer: Self-pay

## 2022-12-06 DIAGNOSIS — R3989 Other symptoms and signs involving the genitourinary system: Secondary | ICD-10-CM

## 2022-12-06 MED ORDER — CEPHALEXIN 500 MG PO CAPS
500.0000 mg | ORAL_CAPSULE | Freq: Two times a day (BID) | ORAL | 0 refills | Status: AC
  Filled 2022-12-06: qty 14, 7d supply, fill #0

## 2022-12-06 NOTE — Progress Notes (Signed)
I have spent 5 minutes in review of e-visit questionnaire, review and updating patient chart, medical decision making and response to patient.   Kerrington Sova [place], PA-C    

## 2022-12-06 NOTE — Progress Notes (Signed)

## 2022-12-07 ENCOUNTER — Ambulatory Visit (INDEPENDENT_AMBULATORY_CARE_PROVIDER_SITE_OTHER): Payer: 59 | Admitting: Family Medicine

## 2022-12-07 ENCOUNTER — Encounter: Payer: Self-pay | Admitting: Family Medicine

## 2022-12-07 VITALS — BP 110/80 | HR 83 | Temp 98.3°F | Resp 18 | Ht 67.0 in | Wt 204.6 lb

## 2022-12-07 DIAGNOSIS — M25512 Pain in left shoulder: Secondary | ICD-10-CM

## 2022-12-07 NOTE — Progress Notes (Addendum)
Subjective:   By signing my name below, I, Shehryar Baig, attest that this documentation has been prepared under the direction and in the presence of Ann Held, DO. 12/07/2022   Patient ID: Sherry Jacobs, female    DOB: [birth year], 57 y.o.   MRN: [id number]  Chief Complaint  Patient presents with   Shoulder Pain    Left shoulder, 1 month,  pt states falling on the stairs. Pt states grabbing on to the railing while falling forward. She states having pain when changing clothes or lifting her arm above her head    HPI Patient is in today for an office visit.  On 10/30/2023, she was going down the stairs at night and missed a few steps at the bottom, falling forward and pulling her left arm. She is experiencing pain while moving left shoulder and localized pain on the anterior aspect of her left shoulder. She denies muscle tightness and spasms. She is not interested in a once daily medication to manage symptoms.    Past Medical History:  Diagnosis Date   ANA positive    Arthralgia    Autoimmune disease ([place])    ANA positive   Endometrial polyp    Hyperlipidemia    IBS (irritable bowel syndrome)    Iron deficiency anemia    Lactose intolerance in adult    PONV (postoperative nausea and vomiting)    Social anxiety disorder    Wears contact lenses     Past Surgical History:  Procedure Laterality Date   [place] N/A 10/28/2014   Procedure: [place];  Surgeon: Marylynn Pearson, MD;  Location: [facility];  Service: Gynecology;  Laterality: N/A;   RIGHT MODIFIED NECK DISSECTION W/ REMOVAL BENIGN RIGHT PARAPHARYNGEAL SPACE MASS AND LYMPH NODES  08-02-2004   benign bronchogenic cysts / negative lymph nodes    Family History  Problem Relation Age of Onset   Breast cancer Mother    Colon polyps Father    Melanoma Maternal Grandfather    Breast cancer  Paternal Grandmother    Asthma Daughter        exercise induced    Asthma Son        exercise induced    Diabetes Other    Hypertension Other    Alcohol abuse Other    Hyperlipidemia Other    Arthritis Other    Stroke Other    Thyroid disease Other    Uterine cancer Other    Colon cancer Neg Hx    Esophageal cancer Neg Hx    Stomach cancer Neg Hx    Rectal cancer Neg Hx     Social History   Socioeconomic History   Marital status: Married    Spouse name: Not on file   Number of children: 2   Years of education: Not on file   Highest education level: Not on file  Occupational History   Occupation: nurse    Employer: [employer]  Tobacco Use   Smoking status: Never    Passive exposure: Never   Smokeless tobacco: Never  Vaping Use   Vaping Use: Never used  Substance and Sexual Activity   Alcohol use: Yes    Comment: social    Drug use: Never   Sexual activity: Not on file  Other Topics Concern   Not on file  Social History Narrative   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      cephALEXin (KEFLEX) 500 MG capsule Take 1 capsule (500 mg total) by mouth 2 (two) times daily for 7 days. 14 capsule 0   Multiple Vitamin (MULTIVITAMIN) tablet Take 1 tablet by mouth daily.     propranolol (INDERAL) 20 MG tablet Take 20 mg by mouth as needed.     rosuvastatin (CRESTOR) 10 MG tablet Take 1 tablet (10 mg total) by mouth daily. 90 tablet 1   No facility-administered medications prior to  visit.    Allergies  Allergen Reactions   Sulfa Antibiotics Nausea And Vomiting and Nausea Only    Review of Systems  Constitutional:  Negative for fever and malaise/fatigue.  HENT:  Negative for congestion.   Eyes:  Negative for blurred vision.  Respiratory:  Negative for cough and shortness of breath.   Cardiovascular:  Negative for chest pain, palpitations and leg swelling.  Gastrointestinal:  Negative for vomiting.  Musculoskeletal:  Positive for falls (Fell down several steps injuring left shoulder) and joint pain (With certain motions of the left shoulder). Negative for back pain.       (-)muscle tightness in left shoulder (-)muscle spasms in left shoulder  Skin:  Negative for rash.  Neurological:  Negative for loss of consciousness and headaches.       Objective:    Physical Exam Vitals and nursing note reviewed.  Constitutional:      Appearance: Normal appearance.  HENT:     Head: Normocephalic and atraumatic.     Right Ear: External ear normal.     Left Ear: External ear normal.  Eyes:     Extraocular Movements: Extraocular movements intact.     Pupils: Pupils are equal, round, and reactive to light.  Cardiovascular:     Rate and Rhythm: Normal rate and regular rhythm.     Heart sounds: Normal heart sounds. No murmur heard.    No gallop.  Pulmonary:     Effort: Pulmonary effort is normal. No respiratory distress.     Breath sounds: Normal breath sounds. No wheezing or rales.  Musculoskeletal:        General: Tenderness and signs of injury present. No swelling.     Left shoulder: Tenderness present. No bony tenderness or crepitus. Normal range of motion.     Right lower leg: No edema.     Left lower leg: No edema.     Comments: + tenderness along deltoid ant  No dec rom or strength Pain with active motion across body and int rotation   Neurological:     Mental Status: She is alert and oriented to person, place, and time.  Psychiatric:        Judgment:  Judgment normal.     BP 110/80 (BP Location: Left Arm, Patient Position: Sitting, Cuff Size: Normal)   Pulse 83   Temp 98.3 F (36.8 C) (  Oral)   Resp 18   Ht '5\' 7"'$  (1.702 m)   Wt 204 lb 9.6 oz (92.8 kg)   LMP 04/07/2017   SpO2 98%   BMI 32.04 kg/m  Wt Readings from Last 3 Encounters:  12/07/22 204 lb 9.6 oz (92.8 kg)  10/10/22 204 lb 6.4 oz (92.7 kg)  09/11/22 200 lb 9.6 oz (91 kg)       Assessment & Plan:  Acute pain of left shoulder Assessment & Plan: Con't with as needed nsaid Ice/ heat  Offered pred taper but pt did not want to take it  Pt given exercises to try  Consider PT vs sport med if no improvement      I, Ann Held, DO, personally preformed the services described in this documentation.  All medical record entries made by the scribe were at my direction and in my presence.  I have reviewed the chart and discharge instructions (if applicable) and agree that the record reflects my personal performance and is accurate and complete. 12/07/2022   I,Shehryar Baig,acting as a scribe for Ann Held, DO.,have documented all relevant documentation on the behalf of Ann Held, DO,as directed by  Ann Held, DO while in the presence of Ann Held, DO.   Ann Held, DO

## 2022-12-07 NOTE — Patient Instructions (Addendum)
Shoulder Range of Motion Exercises Shoulder range of motion (ROM) exercises are done to keep the shoulder moving freely or to increase movement. They are recommended for people who have shoulder pain or stiffness or who are recovering from a shoulder surgery. Ask your health care provider which exercises are safe for you. Do exercises exactly as told by your health care provider and adjust them as directed. It is normal to feel mild stretching, pulling, tightness, or discomfort as you do these exercises. Stop right away if you feel sudden pain or your pain gets worse. Do not begin these exercises until told by your health care provider. Phase 1 exercise When you are able, do this exercise 1-2 times a day for 30-60 seconds in each direction, or as directed by your health care provider. Pendulum exercise     To do this exercise while sitting: Sit in a chair or at the edge of your bed with your feet flat on the floor. Let your affected arm hang down in front of you over the edge of the bed or chair. Relax your shoulder, arm, and hand. Rock your body so your arm gently swings in small circles. You can also use your unaffected arm to start the motion. Repeat, changing the direction of the circles, swinging your arm left and right, and swinging your arm forward and back. To do this exercise while standing: Stand next to a sturdy chair or table, and hold on to it with your hand on your unaffected side. Bend forward at the waist. Bend your knees slightly. Relax your shoulder, arm, and hand. While keeping your shoulder relaxed, use body motion to swing your arm in small circles. Repeat, changing the direction of the circles, swinging your arm left and right, and swinging your arm forward and back. Between exercises, stand up tall and take a short break to relax your lower back.  Phase 2 exercises Do these exercises 1-2 times a day or as told by your health care provider. Hold each stretch for 30  seconds, and repeat 3 times. Do the exercises with one or both arms as instructed by your health care provider. For these exercises, sit at a table with your hand and arm supported by the table. A chair that slides easily or has wheels can be helpful. External rotation  Turn your chair so that your affected side is nearest to the table. Place your forearm on the table to your side. Bend your arm to about a 90-degree angle (right angle) at the elbow, and place your hand palm-down on the table. Your elbow should be about 6 inches (15 cm) away from your side. Keeping your arm on the table, lean your body forward. Abduction  Turn your chair so that your affected side is nearest to the table. Place your forearm and hand on the table so that your thumb points toward the ceiling and your arm is straight out to your side. Slide your hand out to the side and away from you. To increase the stretch, you can slide your chair away from the table. Flexion: forward stretch  Sit facing the table. Place your hand and elbow on the table in front of you. Slide your hand forward and away from you, using your unaffected arm to do the work. To increase the stretch, you can slide your chair backward. Phase 3 exercises Do these exercises 1-2 times a day or as told by your health care provider. Hold each stretch for 30 seconds, and   repeat 3 times. Do the exercises with one or both arms as instructed by your health care provider. You will need a cane, a piece of PVC pipe, or a sturdy wooden dowel for the wand exercises. Cross-body stretch: posterior capsule stretch  Lift your arm straight out in front of you. Bend your arm in a 90-degree angle (right angle) at the elbow so your forearm moves across your body. Use your other arm to gently pull the elbow across your body, toward your other shoulder. Wall climbs  Stand with your affected arm extended out to the side with your hand resting on a door frame. Slide your  hand slowly up the door frame. To increase the stretch, step through the door frame. Keep your body upright and do not lean. Flexion     To do this exercise while standing: Hold the wand with both of your hands, palms-down. Lift the wand up and over your head, if able. Lift mostly with your affected arm, and use the other arm to help. Push upward with your other arm to gently increase the stretch. To do this exercise while lying down: Lie on your back with your elbows resting on the floor and the wand in both your hands. Your hands will be palm-down, or pointing toward your feet. Lift your hands toward the ceiling, using your unaffected arm to help if needed. Bring your arms overhead as able, using your unaffected arm to help if needed.  Internal rotation  Stand while holding the wand behind you with both hands. Your unaffected arm should be extended above your head with the arm of the affected side extended behind you at the level of your waist. The wand should be pointing straight up and down as you hold it. Slowly pull the wand up behind your back by straightening the elbow of your unaffected arm and bending the elbow of your affected arm. External rotation  Lie on your back with your affected upper arm supported on a small pillow or rolled towel. When you first do this exercise, keep your upper arm close to your body. Over time, bring your arm up to a 90-degree angle (right angle) out to the side. Hold the wand across your stomach and with both hands palm-up. Your elbow on your affected side should be bent at a 90-degree angle. Use your unaffected side to help push your forearm away from you and toward the floor. Keep your elbow on your affected side bent at a 90-degree angle. This information is not intended to replace advice given to you by your health care provider. Make sure you discuss any questions you have with your health care provider. Document Revised: 01/02/2022 Document  Reviewed: 01/02/2022 Elsevier Patient Education  2023 [employer]  

## 2022-12-07 NOTE — Assessment & Plan Note (Signed)
Con't with as needed nsaid Ice/ heat  Offered pred taper but pt did not want to take it  Pt given exercises to try  Consider PT vs sport med if no improvement

## 2022-12-11 DIAGNOSIS — K5900 Constipation, unspecified: Secondary | ICD-10-CM | POA: Diagnosis not present

## 2022-12-11 DIAGNOSIS — N393 Stress incontinence (female) (male): Secondary | ICD-10-CM | POA: Diagnosis not present

## 2022-12-14 ENCOUNTER — Other Ambulatory Visit (HOSPITAL_BASED_OUTPATIENT_CLINIC_OR_DEPARTMENT_OTHER): Payer: Self-pay

## 2022-12-14 MED ORDER — COMIRNATY 30 MCG/0.3ML IM SUSY
PREFILLED_SYRINGE | INTRAMUSCULAR | 0 refills | Status: DC
  Filled 2022-12-14: qty 0.3, 1d supply, fill #0

## 2022-12-19 ENCOUNTER — Other Ambulatory Visit (HOSPITAL_BASED_OUTPATIENT_CLINIC_OR_DEPARTMENT_OTHER): Payer: Self-pay

## 2022-12-24 ENCOUNTER — Other Ambulatory Visit (HOSPITAL_BASED_OUTPATIENT_CLINIC_OR_DEPARTMENT_OTHER): Payer: Self-pay

## 2023-01-17 ENCOUNTER — Encounter: Payer: Self-pay | Admitting: Family Medicine

## 2023-01-17 DIAGNOSIS — M25512 Pain in left shoulder: Secondary | ICD-10-CM

## 2023-02-01 NOTE — Therapy (Signed)
OUTPATIENT PHYSICAL THERAPY SHOULDER EVALUATION   Patient Name: Sherry Jacobs MRN: [id number] DOB:Mar 01, 1966, 57 y.o., female Today's Date: 02/11/2023   END OF SESSION:  PT End of Session - 02/11/23 0806     Visit Number 1    Date for PT Re-Evaluation 03/25/23    Authorization Type [facility] - [employer] Save - [id number]    PT Start Time 0806    PT Stop Time 0845    PT Time Calculation (min) 39 min    Activity Tolerance Patient tolerated treatment well    Behavior During Therapy WFL for tasks assessed/performed             Past Medical History:  Diagnosis Date   ANA positive    Arthralgia    Autoimmune disease ([place])    ANA positive   Endometrial polyp    Hyperlipidemia    IBS (irritable bowel syndrome)    Iron deficiency anemia    Lactose intolerance in adult    PONV (postoperative nausea and vomiting)    Social anxiety disorder    Wears contact lenses    Past Surgical History:  Procedure Laterality Date   [place] N/A 10/28/2014   Procedure: DILATATION & CURETTAGE/HYSTEROSCOPY WITH MYOSURE;  Surgeon: Marylynn Pearson, MD;  Location: [facility];  Service: Gynecology;  Laterality: N/A;   RIGHT MODIFIED NECK DISSECTION W/ REMOVAL BENIGN RIGHT PARAPHARYNGEAL SPACE MASS AND LYMPH NODES  08-02-2004   benign bronchogenic cysts / negative lymph nodes   Patient Active Problem List   Diagnosis Date Noted   Acute pain of left shoulder 12/07/2022   Varicose veins of left leg with edema 08/28/2022   Tinea corporis 08/07/2022   Preventative health care 01/02/2018   URI (upper respiratory infection) 02/19/2017   Neck strain 02/19/2017   High risk medication use 01/02/2017   Right ankle pain 08/05/2013   Cellulitis, toe 03/21/2012   UNSPECIFIED VITAMIN D DEFICIENCY 02/02/2009   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   LEG PAIN, LEFT 10/28/2008   ANXIETY 06/23/2008   UNSPECIFIED OSTEOPOROSIS 12/29/2007    HYPERLIPIDEMIA 10/02/2007   INJURY, 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 09/09/2007   Palpitations 09/09/2007   ANA POSITIVE, HX OF 09/09/2007   IRRITABLE BOWEL SYNDROME 04/11/2007   FIBROMYALGIA 04/11/2007    PCP: Ann Held, DO   REFERRING PROVIDER: Ann Held, DO  REFERRING DIAG: [phone] (ICD-10-CM) - Acute pain of left shoulder   THERAPY DIAG:  Acute pain of left shoulder  Abnormal posture  Muscle weakness (generalized)  RATIONALE FOR EVALUATION AND TREATMENT: Rehabilitation  ONSET DATE: 10/29/22  NEXT MD VISIT: PRN   SUBJECTIVE:  SUBJECTIVE STATEMENT: On 10/29/22 she missed a couple steps causing her to fall fwd while still holding on to the railing causing her arm to jerk back. Pain got progressively worse.  Difficulty with taking her shirt off or reaching back, with pain at end ranges in overhead flexion and abduction as well. Sore to sleep on her L side.  PAIN: Are you having pain? 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 Yes. Number of falls 1 - at time of injury  LIVING ENVIRONMENT: Lives with: lives with their family and lives with their spouse - husband and two kids Lives in: House/apartment Stairs: Yes: Internal: 14 steps; on right going up Has following equipment at home: None  OCCUPATION: FT - Environmental health practitioner at [employer]: Independent  and Leisure: time with family, pilates 3x/wk   PATIENT GOALS: "To get full function back w/o pain and not have to do surgery."   OBJECTIVE: (objective measures completed at initial evaluation unless otherwise dated)  DIAGNOSTIC FINDINGS:  N/A  PATIENT SURVEYS:  Quick Dash 15.9 / 100 = 15.9 %  COGNITION: Overall cognitive status: Within functional limits for tasks assessed     SENSATION: WFL  POSTURE: rounded shoulders L>R  UPPER EXTREMITY ROM:   Active ROM Right eval Left eval  Shoulder flexion 150 140 *  Shoulder extension 48 48  Shoulder abduction 171 148 *  Shoulder adduction    Shoulder internal rotation FIR WNL FIR WNL *  Shoulder external rotation FER WNL FER WNL *  Elbow flexion    Elbow extension    Wrist flexion    Wrist extension    Wrist ulnar deviation    Wrist radial deviation    Wrist pronation    Wrist supination    (Blank rows = not tested, * = pain)  UPPER EXTREMITY MMT:  MMT Right eval Left eval  Shoulder flexion 5 4+ *  Shoulder extension 5 4+  Shoulder abduction 5 4+ *  Shoulder adduction    Shoulder internal rotation 5 4+ *  Shoulder external rotation 5 4  Middle trapezius    Lower trapezius    Elbow flexion    Elbow extension    Wrist flexion    Wrist extension    Wrist ulnar deviation    Wrist radial deviation    Wrist pronation    Wrist supination    Grip strength (lbs)    (Blank rows = not tested, * = pain)  SHOULDER SPECIAL TESTS: Impingement tests: Neer impingement test: positive , Hawkins/Kennedy impingement test: positive , and Painful arc test: negative Rotator cuff assessment: Empty can test: positive  and Full can test: positive   JOINT MOBILITY TESTING:  WNL  PALPATION:  Increased muscle tension and TTP over L anterior deltoid & pecs    TODAY'S TREATMENT:   02/11/23 Initial eval THERAPEUTIC EXERCISE: to improve flexibility, strength and mobility.  Verbal and tactile cues throughout for technique. 3-way  doorway L pec stretch x 30" each Standing GTB row + Scap retraction/depression 10 x 5" Standing GTB retraction + shoulder extension to neutral 10 x 5"   PATIENT EDUCATION:  Education details: PT eval findings, anticipated POC, and initial HEP  Person educated: Patient Education method: Explanation, Demonstration, Verbal cues, and [place] code texted to patient Education comprehension: verbalized understanding, returned demonstration, verbal cues required, and needs further education  HOME EXERCISE PROGRAM: Access Code: [email or web address] URL: [email or web address] Date: 02/11/2023 Prepared by: Annie Paras  Exercises - Doorway Pec Stretch at 60 Degrees Abduction with Arm Straight (Mirrored)  - 2-3 x daily - 7 x weekly - 3 reps - 30 sec hold - Single Arm Doorway Pec Stretch at 90 Degrees Abduction (Mirrored)  - 2-3 x daily - 7 x weekly - 3 reps - 30 sec hold - Single Arm Doorway Pec Stretch at 120 Degrees Abduction (Mirrored)  - 2-3 x daily - 7 x weekly - 3 reps - 30 sec hold - Standing Bilateral Low Shoulder Row with Anchored Resistance  - 1 x daily - 7 x weekly - 2 sets - 10 reps - 5 sec hold - Scapular Retraction with Resistance Advanced  - 1 x daily - 7 x weekly - 2 sets - 10 reps - 5 sec hold   ASSESSMENT:  CLINICAL IMPRESSION: Sherry Jacobs is a 57 y.o. female who was seen today for physical therapy evaluation and treatment for acute left shoulder pain.  Pain originated after her arm was pulled back during a fall down the stairs on 10/29/2022 and seems to have gradually gotten worse.  Pain predominantly at end ranges of motion with FIR most limited and patient noting difficulty with taking a shirt off.  Current deficits include L shoulder pain, abnormal posture, increased muscle tension/tightness in L anterior shoulder, mildly limited L shoulder AROM, and mildly decreased L shoulder and scapular strength.  [place] will benefit from skilled PT to address above deficits to  improve mobility and activity tolerance with decreased pain interference.   OBJECTIVE IMPAIRMENTS: decreased activity tolerance, decreased knowledge of condition, decreased ROM, decreased strength, increased fascial restrictions, impaired perceived functional ability, increased muscle spasms, impaired flexibility, impaired UE functional use, improper body mechanics, postural dysfunction, and pain.   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   REHAB POTENTIAL: Excellent  CLINICAL DECISION MAKING: Stable/uncomplicated  EVALUATION COMPLEXITY: Low   GOALS: Goals reviewed with patient? Yes  SHORT TERM GOALS: Target date: 03/04/2023   Patient will be independent with initial HEP to improve outcomes and carryover.  Baseline: Initial HEP provided on eval Goal status: INITIAL  LONG TERM GOALS: Target date: 03/25/2023   Patient will be independent with ongoing/advanced HEP for self-management at home.  Baseline:  Goal status: INITIAL  2.  Patient will report >/= 75% improvement in L shoulder pain to improve QOL.  Baseline: 2/10 at rest, 4-5/10 with triggering activities Goal status: INITIAL  3.  Patient to demonstrate improved upright posture with posterior shoulder girdle engaged to promote improved glenohumeral joint mobility. Baseline: Mildly/rounded L shoulder Goal status: INITIAL  4.  Patient to improve L shoulder AROM to WNL without pain provocation to allow for increased ease of ADLs.  Baseline: Mild limited ROM with pain at end range of motion Goal status: INITIAL  5.  Patient  will demonstrate improved L shoulder strength to 5/5 for functional UE use. Baseline: Refer  to above MMT table Goal status: INITIAL  6  Patient will report </= 6% on QuickDASH to demonstrate improved functional ability.  Baseline: 15.9 / 100 = 15.9 % Goal status: INITIAL  7.  Patient will report ability to reach behind her back and don/shirt without increased pain. Baseline: Pain with these activities Goal status: INITIAL    PLAN:  PT FREQUENCY: 1-2x/week  PT DURATION: 6 weeks  PLANNED INTERVENTIONS: Therapeutic exercises, Therapeutic activity, Neuromuscular re-education, Patient/Family education, Self Care, Joint mobilization, Dry Needling, Electrical stimulation, Cryotherapy, Moist heat, Taping, Vasopneumatic device, Ultrasound, Manual therapy, and Re-evaluation  PLAN FOR NEXT SESSION: Review initial HEP; L shoulder flexibility/stretching; progress scapular/postural and L shoulder strengthening; regular HEP updates as patient wishing to try 1x/wk frequency to start; MT +/- DN as indicated   Percival Spanish, PT 02/11/2023, 10:04 AM

## 2023-02-06 DIAGNOSIS — F4320 Adjustment disorder, unspecified: Secondary | ICD-10-CM | POA: Diagnosis not present

## 2023-02-11 ENCOUNTER — Ambulatory Visit: Payer: 59 | Attending: Family Medicine | Admitting: Physical Therapy

## 2023-02-11 ENCOUNTER — Other Ambulatory Visit: Payer: Self-pay

## 2023-02-11 ENCOUNTER — Encounter: Payer: Self-pay | Admitting: Physical Therapy

## 2023-02-11 ENCOUNTER — Ambulatory Visit: Payer: 59 | Admitting: Family Medicine

## 2023-02-11 DIAGNOSIS — R293 Abnormal posture: Secondary | ICD-10-CM | POA: Diagnosis not present

## 2023-02-11 DIAGNOSIS — M6281 Muscle weakness (generalized): Secondary | ICD-10-CM | POA: Insufficient documentation

## 2023-02-11 DIAGNOSIS — M25512 Pain in left shoulder: Secondary | ICD-10-CM | POA: Insufficient documentation

## 2023-02-14 ENCOUNTER — Ambulatory Visit: Payer: 59 | Admitting: Physical Therapy

## 2023-02-18 DIAGNOSIS — Z1231 Encounter for screening mammogram for malignant neoplasm of breast: Secondary | ICD-10-CM | POA: Diagnosis not present

## 2023-02-19 ENCOUNTER — Encounter: Payer: Self-pay | Admitting: Physical Therapy

## 2023-02-19 ENCOUNTER — Ambulatory Visit: Payer: 59 | Admitting: Physical Therapy

## 2023-02-19 DIAGNOSIS — R293 Abnormal posture: Secondary | ICD-10-CM

## 2023-02-19 DIAGNOSIS — M25512 Pain in left shoulder: Secondary | ICD-10-CM | POA: Diagnosis not present

## 2023-02-19 DIAGNOSIS — M6281 Muscle weakness (generalized): Secondary | ICD-10-CM

## 2023-02-19 NOTE — Therapy (Addendum)
OUTPATIENT PHYSICAL THERAPY TREATMENT   Patient Name: Sherry Jacobs MRN: [id number] DOB:January 12, 1966, 57 y.o., female Today's Date: 02/19/2023   END OF SESSION:  PT End of Session - 02/19/23 1704     Visit Number 2    Date for PT Re-Evaluation 03/25/23    Authorization Type [facility] - [employer] - [id number]    PT Start Time [id number]    PT Stop Time 1748    PT Time Calculation (min) 44 min    Activity Tolerance Patient tolerated treatment well    Behavior During Therapy WFL for tasks assessed/performed              Past Medical History:  Diagnosis Date   ANA positive    Arthralgia    Autoimmune disease ([place])    ANA positive   Endometrial polyp    Hyperlipidemia    IBS (irritable bowel syndrome)    Iron deficiency anemia    Lactose intolerance in adult    PONV (postoperative nausea and vomiting)    Social anxiety disorder    Wears contact lenses    Past Surgical History:  Procedure Laterality Date   [place] N/A 10/28/2014   Procedure: [place];  Surgeon: Marylynn Pearson, MD;  Location: [place];  Service: Gynecology;  Laterality: N/A;   RIGHT MODIFIED NECK DISSECTION W/ REMOVAL BENIGN RIGHT PARAPHARYNGEAL SPACE MASS AND LYMPH NODES  08-02-2004   benign bronchogenic cysts / negative lymph nodes   Patient Active Problem List   Diagnosis Date Noted   Acute pain of left shoulder 12/07/2022   Varicose veins of left leg with edema 08/28/2022   Tinea corporis 08/07/2022   Preventative health care 01/02/2018   URI (upper respiratory infection) 02/19/2017   Neck strain 02/19/2017   High risk medication use 01/02/2017   Right ankle pain 08/05/2013   Cellulitis, toe 03/21/2012   UNSPECIFIED VITAMIN D DEFICIENCY 02/02/2009   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   LEG PAIN, LEFT 10/28/2008   ANXIETY 06/23/2008   UNSPECIFIED OSTEOPOROSIS 12/29/2007   HYPERLIPIDEMIA  10/02/2007   INJURY, 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 HEP was okay but noted increased discomfort with 90 >120 doorway pec stretch.  PAIN: Are you having pain? 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 Yes. Number of falls 1 - at time of injury  LIVING ENVIRONMENT: Lives with: lives with their family and lives with their spouse - husband and two kids Lives in: House/apartment Stairs: Yes: Internal: 14 steps; on right going up Has following equipment at home: None  OCCUPATION: FT - Environmental health practitioner at [employer]: Independent and Leisure: time with family, pilates 3x/wk   PATIENT GOALS: "To get full function back w/o pain and not have to do surgery."   OBJECTIVE: (objective measures completed at initial evaluation unless otherwise dated)  DIAGNOSTIC FINDINGS:  N/A  PATIENT SURVEYS:  Quick Dash 15.9 / 100 = 15.9 %  COGNITION: Overall cognitive status: Within functional limits for tasks  assessed     SENSATION: WFL  POSTURE: rounded shoulders L>R  UPPER EXTREMITY ROM:   Active ROM Right eval Left eval  Shoulder flexion 150 140 *  Shoulder extension 48 48  Shoulder abduction 171 148 *  Shoulder adduction    Shoulder internal rotation FIR WNL FIR WNL *  Shoulder external rotation FER WNL FER WNL *  Elbow flexion    Elbow extension    Wrist flexion    Wrist extension    Wrist ulnar deviation    Wrist radial deviation    Wrist pronation    Wrist supination    (Blank rows = not tested, * = pain)  UPPER EXTREMITY MMT:  MMT Right eval Left eval  Shoulder flexion 5 4+ *  Shoulder extension 5 4+  Shoulder abduction 5 4+ *  Shoulder adduction    Shoulder internal rotation 5 4+ *  Shoulder external rotation 5 4  Middle trapezius    Lower trapezius    Elbow flexion    Elbow extension    Wrist flexion    Wrist extension    Wrist ulnar deviation    Wrist radial deviation    Wrist pronation    Wrist supination    Grip strength (lbs)    (Blank rows = not tested, * = pain)  SHOULDER SPECIAL TESTS: Impingement tests: Neer impingement test: positive , Hawkins/Kennedy impingement test: positive , and Painful arc test: negative Rotator cuff assessment: Empty can test: positive  and Full can test: positive   JOINT MOBILITY TESTING:  WNL  PALPATION:  Increased muscle tension and TTP over L anterior deltoid & pecs    TODAY'S TREATMENT:   02/19/23 THERAPEUTIC EXERCISE: to improve flexibility, strength and mobility.  Verbal and tactile cues throughout for technique.  UBE: L2.0 x 6 min (3' each fwd & back) L single UE doorway pec stretch x 30" each - increased pain/discomfort at 90 > 120, therefore 90 deferred from HEP and pt encourage to lessen intensity at 120 Hooklying snow angel pec stretch over pool noodle 3 x 30" B GTB scap retraction & shoulder ER 2 x 10, hooklying over pool noodle B GTB scap retraction & shoulder horiz ABD 2 x 10, hooklying  over pool noodle B GTB scap retraction & alt shoulder horiz ABD diagonals 2 x 10, hooklying over pool noodle Standing GTB  row + scap retraction/depression 10 x 5", 2 sets Standing GTB retraction + shoulder extension to neutral 10 x 5", 2 sets Standing GTB lat pulldown + scap retraction/depression 10 x 5"  MANUAL THERAPY: To promote normalized muscle tension, improved flexibility, improved joint mobility, increased ROM, and reduced pain.  STM/DTM and manual TPR to L anterior and lateral deltoids, teres group and lats   02/11/23 Initial eval THERAPEUTIC EXERCISE: to improve flexibility, strength and mobility.  Verbal and tactile cues throughout for technique. 3-way doorway L pec stretch x 30" each Standing GTB row + Scap retraction/depression 10 x 5" Standing GTB retraction + shoulder extension to neutral 10 x 5"   PATIENT EDUCATION:  Education details: HEP progression, HEP review, and role of DN  Person educated: Patient Education method: Explanation, Demonstration, Verbal cues, Handouts, and [place] code texted to patient Education comprehension: verbalized understanding, returned demonstration, verbal cues required, and needs further education  HOME EXERCISE PROGRAM: Access Code: [email or web address] URL: [email or web address] Date: 02/19/2023 Prepared by: Annie Paras  Exercises - Doorway Pec Stretch at 60 Degrees Abduction with Arm Straight (Mirrored)  - 2-3 x daily - 7 x weekly - 3 reps - 30 sec hold - Single Arm Doorway Pec Stretch at 90 Degrees Abduction (Mirrored)  - 2-3 x daily - 7 x weekly - 3 reps - 30 sec hold - Single Arm Doorway Pec Stretch at 120 Degrees Abduction (Mirrored)  - 2-3 x daily - 7 x weekly - 3 reps - 30 sec hold - [place] on [employer]  - 2 x daily - 7 x weekly - 5 reps - 15-20 sec hold - Standing Bilateral Low Shoulder Row with Anchored Resistance  - 1 x daily - 3 x weekly - 2 sets - 10 reps - 5 sec hold - Scapular Retraction with Resistance  Advanced  - 1 x daily - 3 x weekly - 2 sets - 10 reps - 5 sec hold - Standing Lat Pull Down with Resistance - Elbows Bent  - 1 x daily - 3 x weekly - 2 sets - 10 reps - 3 sec hold - Supine Shoulder External Rotation on Foam Roll with Theraband  - 1 x daily - 3 x weekly - 2 sets - 10 reps - 3 sec hold - Supine Shoulder Horizontal Abduction with Resistance  - 1 x daily - 3 x weekly - 2 sets - 10 reps - 3 sec hold - Supine PNF D2 Flexion with Resistance  - 1 x daily - 3 x weekly - 2 sets - 10 reps - 3 sec hold  Patient Education - Trigger Point Dry Needling   ASSESSMENT:  CLINICAL IMPRESSION: Valorie reports she typically has no pain at rest but did note some increased pain/discomfort while performing the upper positions of the doorway pec stretch. HEP reviewed with mid (90) doorway pec stretch deferred for now and pt instructed to back off on intensity with 120 stretch. Alternative stretches provided in hooklying over pool noodle with patient instructed to use whichever version works best w/o increasing her pain. Progressed scapular strengthening adding additional shoulder patterns with no increased pain reported. HEP updated accordingly with pt instructed to perform strengthening exercises on alternating days to allow for recovery periods. Increased muscle tension noted in L deltoids and posterior capsule - addressed with MT today with some relief noted and better tolerance for 90/90 shoulder ER following MT, but she may benefit from DN to further address the abnormal muscle tension.  OBJECTIVE IMPAIRMENTS:  decreased activity tolerance, decreased knowledge of condition, decreased ROM, decreased strength, increased fascial restrictions, impaired perceived functional ability, increased muscle spasms, impaired flexibility, impaired UE functional use, improper body mechanics, postural dysfunction, and pain.   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   REHAB POTENTIAL: Excellent  CLINICAL DECISION MAKING: Stable/uncomplicated  EVALUATION COMPLEXITY: Low   GOALS: Goals reviewed with patient? Yes  SHORT TERM GOALS: Target date: 03/04/2023   Patient will be independent with initial HEP to improve outcomes and carryover.  Baseline: Initial HEP provided on eval Goal status: IN PROGRESS  LONG TERM GOALS: Target date: 03/25/2023   Patient will be independent with ongoing/advanced HEP for self-management at home.  Baseline:  Goal status: IN PROGRESS  2.  Patient will report >/= 75% improvement in L shoulder pain to improve QOL.  Baseline: 2/10 at rest, 4-5/10 with triggering activities Goal status: IN PROGRESS  3.  Patient to demonstrate improved upright posture with posterior shoulder girdle engaged to promote improved glenohumeral joint mobility. Baseline: Mildly/rounded L shoulder Goal status: IN PROGRESS  4.  Patient to improve L shoulder AROM to WNL without pain provocation to allow for increased ease of ADLs.  Baseline: Mild limited ROM with pain at end range of motion Goal status: IN PROGRESS  5.  Patient will demonstrate improved L shoulder strength to 5/5 for functional UE use. Baseline: Refer to above MMT table Goal status: IN PROGRESS  6  Patient will report </= 6% on QuickDASH to demonstrate improved functional ability.  Baseline: 15.9 / 100 = 15.9 % Goal status: IN PROGRESS  7.  Patient will report ability to reach behind her back and don/shirt without increased pain. Baseline: Pain with these activities Goal status: IN PROGRESS    PLAN:  PT FREQUENCY:  1-2x/week  PT DURATION: 6 weeks  PLANNED INTERVENTIONS: Therapeutic exercises, Therapeutic activity, Neuromuscular re-education, Patient/Family education, Self Care, Joint mobilization, Dry Needling, Electrical stimulation, Cryotherapy, Moist heat, Taping, Vasopneumatic device, Ultrasound, Manual therapy, and Re-evaluation  PLAN FOR NEXT SESSION: L shoulder flexibility/stretching - introduce shoulder IR stretches; progress scapular/postural and L shoulder strengthening; review HEP PRN with regular HEP updates as patient wishing to try 1x/wk frequency to start; MT +/- DN as indicated   Percival Spanish, PT 02/19/2023, 6:10 PM

## 2023-02-21 DIAGNOSIS — F4320 Adjustment disorder, unspecified: Secondary | ICD-10-CM | POA: Diagnosis not present

## 2023-02-27 ENCOUNTER — Ambulatory Visit: Payer: 59 | Admitting: Physical Therapy

## 2023-02-27 DIAGNOSIS — F4320 Adjustment disorder, unspecified: Secondary | ICD-10-CM | POA: Diagnosis not present

## 2023-02-28 NOTE — Progress Notes (Deleted)
Office Visit Note  Patient: Sherry Jacobs             Date of Birth: 1966/07/11           MRN: [id number]             PCP: Ann Held, DO Referring: Ann Held, * Visit Date: 03/14/2023 Occupation: @GUAROCC @  Subjective:  No chief complaint on file.   History of Present Illness: Sherry Jacobs is a 57 y.o. female ***     Activities of Daily Living:  Patient reports morning stiffness for *** {minute/hour:19697}.   Patient {ACTIONS;DENIES/REPORTS:21021675::"Denies"} nocturnal pain.  Difficulty dressing/grooming: {ACTIONS;DENIES/REPORTS:21021675::"Denies"} Difficulty climbing stairs: {ACTIONS;DENIES/REPORTS:21021675::"Denies"} Difficulty getting out of chair: {ACTIONS;DENIES/REPORTS:21021675::"Denies"} Difficulty using hands for taps, buttons, cutlery, and/or writing: {ACTIONS;DENIES/REPORTS:21021675::"Denies"}  No Rheumatology ROS completed.   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 09/09/2007   Palpitations 09/09/2007   ANA POSITIVE, HX OF 09/09/2007   IRRITABLE BOWEL SYNDROME 04/11/2007   FIBROMYALGIA 04/11/2007    Past Medical History:  Diagnosis Date   ANA positive    Arthralgia    Autoimmune disease ([place])    ANA positive   Endometrial polyp    Hyperlipidemia    IBS (irritable bowel syndrome)    Iron deficiency anemia    Lactose intolerance in adult    PONV  (postoperative nausea and vomiting)    Social anxiety disorder    Wears contact lenses     Family History  Problem Relation Age of Onset   Breast cancer Mother    Colon polyps Father    Melanoma Maternal Grandfather    Breast cancer Paternal Grandmother    Asthma Daughter        exercise induced    Asthma Son        exercise induced    Diabetes Other    Hypertension Other    Alcohol abuse Other    Hyperlipidemia Other    Arthritis Other    Stroke Other    Thyroid disease Other    Uterine cancer Other    Colon cancer Neg Hx    Esophageal cancer Neg Hx    Stomach cancer Neg Hx    Rectal cancer Neg Hx    Past Surgical History:  Procedure Laterality Date   [place] N/A 10/28/2014   Procedure: [place];  Surgeon: Marylynn Pearson, MD;  Location: [facility];  Service: Gynecology;  Laterality: N/A;   RIGHT MODIFIED NECK DISSECTION W/ REMOVAL BENIGN RIGHT PARAPHARYNGEAL SPACE MASS AND LYMPH NODES  08-02-2004   benign bronchogenic cysts / negative lymph nodes   Social History   Social History Narrative   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   Living at home with husband and two kids  Immunization History  Administered Date(s) Administered   COVID-19, mRNA, vaccine(Comirnaty)12 years and older 12/14/2022   H1N1 10/13/2008   Influenza Whole 11/26/2004, 09/15/2008, 08/30/2010   Influenza-Unspecified 08/27/2011, 08/26/2016, 09/05/2019, 09/04/2020   Moderna Covid-19 Vaccine Bivalent Booster 82yrs & up 11/22/2021   PFIZER(Purple Top)SARS-COV-2 Vaccination 11/17/2019, 12/08/2019   Pneumococcal Polysaccharide-23 05/11/2011   Td 12/15/2008   Tdap 08/06/2022   Unspecified SARS-COV-2 Vaccination 11/02/2020   Varicella 08/07/2021   Zoster Recombinat (Shingrix) 06/06/2021, 08/08/2021   Zoster, Live 02/24/2022     Objective: Vital Signs: LMP  04/07/2017    Physical Exam   Musculoskeletal Exam: ***  CDAI Exam: CDAI Score: -- Patient Global: --; Provider Global: -- Swollen: --; Tender: -- Joint Exam 03/14/2023   No joint exam has been documented for this visit   There is currently no information documented on the homunculus. Go to the Rheumatology activity and complete the homunculus joint exam.  Investigation: No additional findings.  Imaging: No results found.  Recent Labs: Lab Results  Component Value Date   WBC 5.4 09/11/2022   HGB 13.7 09/11/2022   PLT 257 09/11/2022   NA 142 09/11/2022   K 4.0 09/11/2022   CL 105 09/11/2022   CO2 26 09/11/2022   GLUCOSE 124 (H) 09/11/2022   BUN 22 09/11/2022   CREATININE 0.99 09/11/2022   BILITOT 0.4 09/11/2022   ALKPHOS 69 09/21/2021   AST 19 09/11/2022   ALT 24 09/11/2022   PROT 7.1 09/11/2022   ALBUMIN 4.8 09/21/2021   CALCIUM 9.8 09/11/2022   GFRAA 88 01/10/2021    Speciality Comments: PLQ Eye Exam: 10/27/2021 WNL @ [facility],  Procedures:  No procedures performed Allergies: Sulfa antibiotics   Assessment / Plan:     Visit Diagnoses: Autoimmune disease  High risk medication use  Raynaud's disease without gangrene  Sicca syndrome  Primary osteoarthritis of both hands  Fibromyalgia  Trochanteric bursitis of both hips  Vitamin D deficiency  History of anxiety  History of hyperlipidemia  Other fatigue  History of IBS  Orders: No orders of the defined types were placed in this encounter.  No orders of the defined types were placed in this encounter.   Face-to-face time spent with patient was *** minutes. Greater than 50% of time was spent in counseling and coordination of care.  Follow-Up Instructions: No follow-ups on file.   Ofilia Neas, PA-C  Note - This record has been created using Dragon software.  Chart creation errors have been sought, but may not always  have been located. Such creation errors do not reflect  on  the standard of medical care.

## 2023-03-04 ENCOUNTER — Ambulatory Visit: Payer: 59 | Attending: Family Medicine | Admitting: Physical Therapy

## 2023-03-04 ENCOUNTER — Encounter: Payer: Self-pay | Admitting: Physical Therapy

## 2023-03-04 DIAGNOSIS — R293 Abnormal posture: Secondary | ICD-10-CM | POA: Insufficient documentation

## 2023-03-04 DIAGNOSIS — M6281 Muscle weakness (generalized): Secondary | ICD-10-CM | POA: Diagnosis not present

## 2023-03-04 DIAGNOSIS — M25512 Pain in left shoulder: Secondary | ICD-10-CM | POA: Diagnosis not present

## 2023-03-04 NOTE — Therapy (Signed)
OUTPATIENT PHYSICAL THERAPY TREATMENT   Patient Name: Sherry Jacobs MRN: [id number] DOB:August 20, 1966, 57 y.o., female Today's Date: 03/04/2023   END OF SESSION:  PT End of Session - 03/04/23 0849     Visit Number 3    Date for PT Re-Evaluation 03/25/23    Authorization Type [facility] - [employer] Save - VL:[id number]    PT Start Time [phone]    PT Stop Time 0930    PT Time Calculation (min) 41 min    Activity Tolerance Patient tolerated treatment well    Behavior During Therapy WFL for tasks assessed/performed               Past Medical History:  Diagnosis Date   ANA positive    Arthralgia    Autoimmune disease    ANA positive   Endometrial polyp    Hyperlipidemia    IBS (irritable bowel syndrome)    Iron deficiency anemia    Lactose intolerance in adult    PONV (postoperative nausea and vomiting)    Social anxiety disorder    Wears contact lenses    Past Surgical History:  Procedure Laterality Date   APPENDECTOMY  1981   DILATATION & CURETTAGE/HYSTEROSCOPY WITH MYOSURE N/A 10/28/2014   Procedure: DILATATION & CURETTAGE/HYSTEROSCOPY WITH MYOSURE;  Surgeon: Zelphia Cairo, MD;  Location: [facility] [place];  Service: Gynecology;  Laterality: N/A;   RIGHT MODIFIED NECK DISSECTION W/ REMOVAL BENIGN RIGHT PARAPHARYNGEAL SPACE MASS AND LYMPH NODES  08-02-2004   benign bronchogenic cysts / negative lymph nodes   Patient Active Problem List   Diagnosis Date Noted   Acute pain of left shoulder 12/07/2022   Varicose veins of left leg with edema 08/28/2022   Tinea corporis 08/07/2022   Preventative health care 01/02/2018   URI (upper respiratory infection) 02/19/2017   Neck strain 02/19/2017   High risk medication use 01/02/2017   Right ankle pain 08/05/2013   Cellulitis, toe 03/21/2012   UNSPECIFIED VITAMIN D DEFICIENCY 02/02/2009   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   LEG PAIN, LEFT 10/28/2008   ANXIETY 06/23/2008   UNSPECIFIED OSTEOPOROSIS 12/29/2007   HYPERLIPIDEMIA 10/02/2007    INJURY, 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 Still with limited tolerance for some stretches and exercises.   PAIN: Are you having pain? 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 Yes. Number of falls 1 - at time of injury  LIVING ENVIRONMENT: Lives with: lives with their family and lives with their spouse - husband and two kids Lives in: House/apartment Stairs: Yes: Internal: 14 steps; on right going up Has following equipment at home: None  OCCUPATION: FT - Automotive engineer at [employer]  PLOF: Independent and Leisure: time with family, pilates 3x/wk   PATIENT GOALS: "To get full function back w/o pain and not have to do surgery."   OBJECTIVE: (objective measures completed at initial evaluation unless otherwise dated)  DIAGNOSTIC FINDINGS:  N/A  PATIENT  SURVEYS:  Quick Dash 15.9 / 100 = 15.9 %  COGNITION: Overall cognitive status: Within functional limits for tasks assessed     SENSATION: WFL  POSTURE: rounded shoulders L>R  UPPER EXTREMITY ROM:   Active ROM Right eval Left eval  Shoulder flexion 150 140 *  Shoulder extension 48 48  Shoulder abduction 171 148 *  Shoulder adduction    Shoulder internal rotation FIR WNL FIR WNL *  Shoulder external rotation FER WNL FER WNL *  Elbow flexion    Elbow extension    Wrist flexion    Wrist extension    Wrist ulnar deviation    Wrist radial deviation    Wrist pronation    Wrist supination    (Blank rows = not tested, * = pain)  UPPER EXTREMITY MMT:  MMT Right eval Left eval  Shoulder flexion 5 4+ *  Shoulder extension 5 4+  Shoulder abduction 5 4+ *  Shoulder adduction    Shoulder internal rotation 5 4+ *  Shoulder external rotation 5 4  Middle trapezius    Lower trapezius    Elbow flexion    Elbow extension    Wrist flexion    Wrist extension    Wrist ulnar deviation    Wrist radial deviation    Wrist pronation    Wrist supination    Grip strength (lbs)    (Blank rows = not tested, * = pain)  SHOULDER SPECIAL TESTS: Impingement tests: Neer impingement test: positive , Hawkins/Kennedy impingement test: positive , and Painful arc test: negative Rotator cuff assessment: Empty can test: positive  and Full can test: positive   JOINT MOBILITY TESTING:  WNL  PALPATION:  Increased muscle tension and TTP over L anterior deltoid & pecs    TODAY'S TREATMENT:   03/04/23 THERAPEUTIC EXERCISE: to improve flexibility, strength and mobility.  Verbal and tactile cues throughout for technique.  NuStep - L5 x 6 min L low/mid/high doorway pec stretch x 30" - better tolerance for mid position following DN B GTB scap retraction & shoulder ER x 10, standing with back against doorframe to facilitate scap retraction B GTB scap retraction & shoulder horiz ABD x 10, standing  with back against doorframe to facilitate scap retraction B GTB scap retraction & alt shoulder horiz ABD diagonals x 10, standing with back against doorframe to facilitate scap retraction Standing GTB  row + scap retraction/depression 10 x 5" Standing GTB retraction + shoulder extension to neutral 10 x 5" Standing GTB lat pulldown + scap retraction/depression 10 x 5"  MANUAL THERAPY: To promote normalized muscle tension, improved flexibility, improved joint mobility, increased ROM, and reduced pain. Skilled palpation and monitoring of soft tissue during DN Trigger Point Dry-Needling  Treatment instructions: Expect mild to moderate muscle soreness. S/S of pneumothorax if dry needled over a lung field, and to seek immediate medical attention should they occur. Patient verbalized understanding of these instructions and education. Patient Consent Given: Yes Education handout provided: Previously provided Muscles treated: L anterior and lateral deltoid, L pecs Electrical stimulation performed: No Parameters: N/A Treatment response/outcome: Twitch Response Elicited and Palpable Increase in Muscle Length STM/DTM, manual TPR and pin & stretch to muscles addressed with DN   02/19/23 THERAPEUTIC EXERCISE: to improve flexibility, strength and mobility.  Verbal and tactile cues throughout for technique.  UBE: L2.0 x 6 min (3' each fwd & back) L single UE doorway pec stretch x 30" each - increased pain/discomfort at 90 > 120, therefore 90 deferred from HEP and pt encourage to lessen intensity at 120 Hooklying snow angel pec stretch over pool noodle 3 x 30" B GTB scap retraction & shoulder ER 2 x 10, hooklying over pool noodle B GTB scap retraction & shoulder horiz ABD 2 x 10, hooklying over pool noodle B GTB scap retraction & alt shoulder horiz ABD diagonals 2 x 10, hooklying over pool noodle Standing GTB row + scap retraction/depression 10 x 5", 2 sets Standing GTB retraction + shoulder extension  to neutral 10 x 5", 2 sets Standing GTB lat pulldown + scap retraction/depression 10 x 5"  MANUAL THERAPY: To promote normalized muscle tension, improved flexibility, improved joint mobility, increased ROM, and reduced pain.  STM/DTM and manual TPR to L anterior and lateral deltoids, teres group and lats   02/11/23 Initial eval THERAPEUTIC EXERCISE: to improve flexibility, strength and mobility.  Verbal and tactile cues throughout for technique. 3-way doorway L pec stretch x 30" each Standing GTB row + Scap retraction/depression 10 x 5" Standing GTB retraction + shoulder extension to neutral 10 x 5"   PATIENT EDUCATION:  Education details: HEP review, role of DN, and DN rational, procedure, outcomes, potential side effects, and recommended post-treatment exercises/activity  Person educated: Patient Education method: Explanation and Handouts Education comprehension: verbalized understanding  HOME EXERCISE PROGRAM: *Access Code: [email or web address] URL: [email or web address] Date: 02/19/2023 Prepared by: Glenetta Hew  Exercises - Doorway Pec Stretch at 60 Degrees Abduction with Arm Straight (Mirrored)  - 2-3 x daily - 7 x weekly - 3 reps - 30 sec hold - Single Arm Doorway Pec Stretch at 90 Degrees Abduction (Mirrored)  - 2-3 x daily - 7 x weekly - 3 reps - 30 sec hold - Single Arm Doorway Pec Stretch at 120 Degrees Abduction (Mirrored)  - 2-3 x daily - 7 x weekly - 3 reps - 30 sec hold - Snow Angels on [employer]  - 2 x daily - 7 x weekly - 5 reps - 15-20 sec hold - Standing Bilateral Low Shoulder Row with Anchored Resistance  - 1 x daily - 3 x weekly - 2 sets - 10 reps - 5 sec hold - Scapular Retraction with Resistance Advanced  - 1 x daily - 3 x weekly - 2 sets - 10 reps - 5 sec hold - Standing Lat Pull Down with Resistance - Elbows Bent  - 1 x daily -  3 x weekly - 2 sets - 10 reps - 3 sec hold - Supine Shoulder External Rotation on Foam Roll with Theraband  - 1 x daily - 3 x  weekly - 2 sets - 10 reps - 3 sec hold - Supine Shoulder Horizontal Abduction with Resistance  - 1 x daily - 3 x weekly - 2 sets - 10 reps - 3 sec hold - Supine PNF D2 Flexion with Resistance  - 1 x daily - 3 x weekly - 2 sets - 10 reps - 3 sec hold  Patient Education - Trigger Point Dry Needling  *Pt using MedBridgeGO app   ASSESSMENT:  CLINICAL IMPRESSION: Meradith reports continued limitation in L shoulder ROM and tolerance for exercises due to L anterolateral shoulder/upper arm pain. Taut bands identified which appeared amenable to DN. After explanation of DN rational, procedures, outcomes and potential side effects, patient verbalized consent to DN treatment in conjunction with manual STM/DTM and TPR to reduce ttp/muscle tension. Muscles treated as indicated above. DN produced normal response with good twitches elicited resulting in palpable reduction in pain/ttp and muscle tension, allowing for better stretching and exercise tolerance. Pt educated to expect mild to moderate muscle soreness for up to 24-48 hrs and instructed to continue prescribed home exercise program and current activity level with pt verbalizing understanding of theses instructions.   OBJECTIVE IMPAIRMENTS: decreased activity tolerance, decreased knowledge of condition, decreased ROM, decreased strength, increased fascial restrictions, impaired perceived functional ability, increased muscle spasms, impaired flexibility, impaired UE functional use, improper body mechanics, postural dysfunction, and pain.   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   REHAB POTENTIAL: Excellent  CLINICAL DECISION MAKING: Stable/uncomplicated  EVALUATION COMPLEXITY: Low   GOALS: Goals reviewed with patient? Yes  SHORT TERM GOALS: Target date: 03/04/2023   Patient will be independent with initial HEP to improve outcomes and carryover.  Baseline: Initial HEP provided on eval Goal status: MET  03/04/23  LONG TERM GOALS: Target date: 03/25/2023   Patient will be independent with ongoing/advanced HEP for self-management at home.  Baseline:  Goal status: IN PROGRESS  2.  Patient will report >/= 75% improvement in L shoulder pain to improve QOL.  Baseline: 2/10 at rest, 4-5/10 with triggering activities Goal status: IN PROGRESS  3.  Patient to demonstrate improved upright posture with posterior shoulder girdle engaged to promote improved glenohumeral joint mobility. Baseline: Mildly/rounded L shoulder Goal status: IN PROGRESS  4.  Patient to improve L shoulder AROM to WNL without pain provocation to allow for increased ease of ADLs.  Baseline: Mild limited ROM with pain at end range of motion Goal status: IN PROGRESS  5.  Patient will demonstrate improved L shoulder strength to 5/5 for functional UE use. Baseline: Refer to above MMT table Goal status: IN PROGRESS  6  Patient will report </= 6% on QuickDASH to demonstrate improved functional ability.  Baseline: 15.9 / 100 = 15.9 % Goal status: IN PROGRESS  7.  Patient will report ability to reach behind her back and don/shirt without increased pain. Baseline: Pain with these activities Goal status: IN PROGRESS    PLAN:  PT FREQUENCY: 1-2x/week  PT DURATION: 6 weeks  PLANNED INTERVENTIONS: Therapeutic exercises,  Therapeutic activity, Neuromuscular re-education, Patient/Family education, Self Care, Joint mobilization, Dry Needling, Electrical stimulation, Cryotherapy, Moist heat, Taping, Vasopneumatic device, Ultrasound, Manual therapy, and Re-evaluation  PLAN FOR  NEXT SESSION: Assess response to DN; L shoulder flexibility/stretching - introduce shoulder IR stretches; progress scapular/postural and L shoulder strengthening; review HEP PRN with regular HEP updates as patient wishing to try 1x/wk frequency to start; MT +/- DN as indicated   Marry GuanJoAnne M Lucille Witts, PT 03/04/2023, 9:33 AM

## 2023-03-08 DIAGNOSIS — F4320 Adjustment disorder, unspecified: Secondary | ICD-10-CM | POA: Diagnosis not present

## 2023-03-11 ENCOUNTER — Ambulatory Visit: Payer: 59 | Admitting: Physical Therapy

## 2023-03-13 ENCOUNTER — Ambulatory Visit: Payer: 59 | Admitting: Physician Assistant

## 2023-03-13 DIAGNOSIS — L821 Other seborrheic keratosis: Secondary | ICD-10-CM | POA: Diagnosis not present

## 2023-03-13 DIAGNOSIS — L814 Other melanin hyperpigmentation: Secondary | ICD-10-CM | POA: Diagnosis not present

## 2023-03-13 DIAGNOSIS — Z86018 Personal history of other benign neoplasm: Secondary | ICD-10-CM | POA: Diagnosis not present

## 2023-03-13 DIAGNOSIS — D2221 Melanocytic nevi of right ear and external auricular canal: Secondary | ICD-10-CM | POA: Diagnosis not present

## 2023-03-13 DIAGNOSIS — D225 Melanocytic nevi of trunk: Secondary | ICD-10-CM | POA: Diagnosis not present

## 2023-03-13 DIAGNOSIS — L578 Other skin changes due to chronic exposure to nonionizing radiation: Secondary | ICD-10-CM | POA: Diagnosis not present

## 2023-03-13 DIAGNOSIS — D2230 Melanocytic nevi of unspecified part of face: Secondary | ICD-10-CM | POA: Diagnosis not present

## 2023-03-13 DIAGNOSIS — I781 Nevus, non-neoplastic: Secondary | ICD-10-CM | POA: Diagnosis not present

## 2023-03-13 DIAGNOSIS — L309 Dermatitis, unspecified: Secondary | ICD-10-CM | POA: Diagnosis not present

## 2023-03-14 ENCOUNTER — Ambulatory Visit: Payer: 59 | Admitting: Physician Assistant

## 2023-03-14 DIAGNOSIS — R5383 Other fatigue: Secondary | ICD-10-CM

## 2023-03-14 DIAGNOSIS — Z8639 Personal history of other endocrine, nutritional and metabolic disease: Secondary | ICD-10-CM

## 2023-03-14 DIAGNOSIS — E559 Vitamin D deficiency, unspecified: Secondary | ICD-10-CM

## 2023-03-14 DIAGNOSIS — Z8659 Personal history of other mental and behavioral disorders: Secondary | ICD-10-CM

## 2023-03-14 DIAGNOSIS — Z79899 Other long term (current) drug therapy: Secondary | ICD-10-CM

## 2023-03-14 DIAGNOSIS — M797 Fibromyalgia: Secondary | ICD-10-CM

## 2023-03-14 DIAGNOSIS — Z8719 Personal history of other diseases of the digestive system: Secondary | ICD-10-CM

## 2023-03-14 DIAGNOSIS — M19041 Primary osteoarthritis, right hand: Secondary | ICD-10-CM

## 2023-03-14 DIAGNOSIS — I7300 Raynaud's syndrome without gangrene: Secondary | ICD-10-CM

## 2023-03-14 DIAGNOSIS — M3500 Sicca syndrome, unspecified: Secondary | ICD-10-CM

## 2023-03-14 DIAGNOSIS — M359 Systemic involvement of connective tissue, unspecified: Secondary | ICD-10-CM

## 2023-03-14 DIAGNOSIS — M7061 Trochanteric bursitis, right hip: Secondary | ICD-10-CM

## 2023-03-20 ENCOUNTER — Other Ambulatory Visit (HOSPITAL_BASED_OUTPATIENT_CLINIC_OR_DEPARTMENT_OTHER): Payer: Self-pay

## 2023-03-20 ENCOUNTER — Ambulatory Visit: Payer: 59

## 2023-03-20 DIAGNOSIS — R293 Abnormal posture: Secondary | ICD-10-CM | POA: Diagnosis not present

## 2023-03-20 DIAGNOSIS — M25512 Pain in left shoulder: Secondary | ICD-10-CM | POA: Diagnosis not present

## 2023-03-20 DIAGNOSIS — M6281 Muscle weakness (generalized): Secondary | ICD-10-CM

## 2023-03-20 MED ORDER — FLUTICASONE PROPIONATE 0.005 % EX OINT
1.0000 | TOPICAL_OINTMENT | Freq: Two times a day (BID) | CUTANEOUS | 0 refills | Status: DC
  Filled 2023-03-20: qty 30, 15d supply, fill #0

## 2023-03-20 NOTE — Therapy (Signed)
OUTPATIENT PHYSICAL THERAPY TREATMENT   Patient Name: Sherry Jacobs MRN: [id number] DOB:01-31-1966, 57 y.o., female Today's Date: 03/20/2023   END OF SESSION:  PT End of Session - 03/20/23 1106     Visit Number 4    Date for PT Re-Evaluation 03/25/23    Authorization Type [facility] - [employer] - [id number]    PT Start Time 1103    PT Stop Time 1147    PT Time Calculation (min) 44 min    Activity Tolerance Patient tolerated treatment well    Behavior During Therapy WFL for tasks assessed/performed                Past Medical History:  Diagnosis Date   ANA positive    Arthralgia    Autoimmune disease    ANA positive   Endometrial polyp    Hyperlipidemia    IBS (irritable bowel syndrome)    Iron deficiency anemia    Lactose intolerance in adult    PONV (postoperative nausea and vomiting)    Social anxiety disorder    Wears contact lenses    Past Surgical History:  Procedure Laterality Date   APPENDECTOMY  1981   DILATATION & CURETTAGE/HYSTEROSCOPY WITH MYOSURE N/A 10/28/2014   Procedure: DILATATION & CURETTAGE/HYSTEROSCOPY WITH MYOSURE;  Surgeon: Zelphia Cairo, MD;  Location: [facility];  Service: Gynecology;  Laterality: N/A;   RIGHT MODIFIED NECK DISSECTION W/ REMOVAL BENIGN RIGHT PARAPHARYNGEAL SPACE MASS AND LYMPH NODES  08-02-2004   benign bronchogenic cysts / negative lymph nodes   Patient Active Problem List   Diagnosis Date Noted   Acute pain of left shoulder 12/07/2022   Varicose veins of left leg with edema 08/28/2022   Tinea corporis 08/07/2022   Preventative health care 01/02/2018   URI (upper respiratory infection) 02/19/2017   Neck strain 02/19/2017   High risk medication use 01/02/2017   Right ankle pain 08/05/2013   Cellulitis, toe 03/21/2012   UNSPECIFIED VITAMIN D DEFICIENCY 02/02/2009   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   LEG PAIN, LEFT 10/28/2008   ANXIETY 06/23/2008   UNSPECIFIED OSTEOPOROSIS 12/29/2007   HYPERLIPIDEMIA  10/02/2007   INJURY, 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    PAIN: Are you having pain? 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 Yes. Number of falls 1 - at time of injury  LIVING ENVIRONMENT: Lives with: lives with their family and lives with their spouse - husband and two kids Lives in: House/apartment Stairs: Yes: Internal: 14 steps; on right going up Has following equipment at home: None  OCCUPATION: FT - Automotive engineer at [employer]  PLOF: Independent and Leisure: time with family, pilates 3x/wk   PATIENT GOALS: "To get full function back w/o pain and not have to do surgery."   OBJECTIVE: (objective measures completed at initial evaluation unless otherwise dated)  DIAGNOSTIC FINDINGS:  N/A  PATIENT SURVEYS:  Quick Dash 15.9 / 100 = 15.9  %  COGNITION: Overall cognitive status: Within functional limits for tasks assessed     SENSATION: WFL  POSTURE: rounded shoulders L>R  UPPER EXTREMITY ROM:   Active ROM Right eval Left eval  Shoulder flexion 150 140 *  Shoulder extension 48 48  Shoulder abduction 171 148 *  Shoulder adduction    Shoulder internal rotation FIR WNL FIR WNL *  Shoulder external rotation FER WNL FER WNL *  Elbow flexion    Elbow extension    Wrist flexion    Wrist extension    Wrist ulnar deviation    Wrist radial deviation    Wrist pronation    Wrist supination    (Blank rows = not tested, * = pain)  UPPER EXTREMITY MMT:  MMT Right eval Left eval  Shoulder flexion 5 4+ *  Shoulder extension 5 4+  Shoulder abduction 5 4+ *  Shoulder adduction    Shoulder internal rotation 5 4+ *  Shoulder external rotation 5 4  Middle trapezius    Lower trapezius    Elbow flexion    Elbow extension    Wrist flexion    Wrist extension    Wrist ulnar deviation    Wrist radial deviation    Wrist pronation    Wrist supination    Grip strength (lbs)    (Blank rows = not tested, * = pain)  SHOULDER SPECIAL TESTS: Impingement tests: Neer impingement test: positive , Hawkins/Kennedy impingement test: positive , and Painful arc test: negative Rotator cuff assessment: Empty can test: positive  and Full can test: positive   JOINT MOBILITY TESTING:  WNL  PALPATION:  Increased muscle tension and TTP over L anterior deltoid & pecs    TODAY'S TREATMENT:  4/12/30/22 THERAPEUTIC EXERCISE: to improve flexibility, strength and mobility.  Verbal and tactile cues throughout for technique.  Pulleys 3 min flexion and scaption  Reviewed 3 way pec stretches in doorway Snow angels on foam roll reviewed Alternate diagonals GTB on foam roll x 10 both directions Horiz ABD GTB on foam roll 2x10  B ER on foam roll GTB 2x10  Standing shoulder ext GTB and row x 10  Standing lat pull down GTB x 10 AAROM IR  behind back x 10   03/04/23 THERAPEUTIC EXERCISE: to improve flexibility, strength and mobility.  Verbal and tactile cues throughout for technique.  NuStep -  L5 x 6 min L low/mid/high doorway pec stretch x 30" - better tolerance for mid position following DN B GTB scap retraction & shoulder ER x 10, standing with back against doorframe to facilitate scap retraction B GTB scap retraction & shoulder horiz ABD x 10, standing with back against doorframe to facilitate scap retraction B GTB scap retraction & alt shoulder horiz ABD diagonals x 10, standing with back against doorframe to facilitate scap retraction Standing GTB row + scap retraction/depression 10 x 5" Standing GTB retraction + shoulder extension to neutral 10 x 5" Standing GTB lat pulldown + scap retraction/depression 10 x 5"  MANUAL THERAPY: To promote normalized muscle tension, improved flexibility, improved joint mobility, increased ROM, and reduced pain. Skilled palpation and monitoring of soft tissue during DN Trigger Point Dry-Needling  Treatment instructions: Expect mild to moderate muscle soreness. S/S of pneumothorax if dry needled over a lung field, and to seek immediate medical attention should they occur. Patient verbalized understanding of these instructions and education. Patient Consent Given: Yes Education handout provided: Previously provided Muscles treated: L anterior and lateral deltoid, L pecs Electrical stimulation performed: No Parameters: N/A Treatment response/outcome: Twitch Response Elicited and Palpable Increase in Muscle Length STM/DTM, manual TPR and pin & stretch to muscles addressed with DN   02/19/23 THERAPEUTIC EXERCISE: to improve flexibility, strength and mobility.  Verbal and tactile cues throughout for technique.  UBE: L2.0 x 6 min (3' each fwd & back) L single UE doorway pec stretch x 30" each - increased pain/discomfort at 90 > 120, therefore 90 deferred from HEP and pt encourage to lessen  intensity at 120 Hooklying snow angel pec stretch over pool noodle 3 x 30" B GTB scap retraction & shoulder ER 2 x 10, hooklying over pool noodle B GTB scap retraction & shoulder horiz ABD 2 x 10, hooklying over pool noodle B GTB scap retraction & alt shoulder horiz ABD diagonals 2 x 10, hooklying over pool noodle Standing GTB row + scap retraction/depression 10 x 5", 2 sets Standing GTB retraction + shoulder extension to neutral 10 x 5", 2 sets Standing GTB lat pulldown + scap retraction/depression 10 x 5"  MANUAL THERAPY: To promote normalized muscle tension, improved flexibility, improved joint mobility, increased ROM, and reduced pain.  STM/DTM and manual TPR to L anterior and lateral deltoids, teres group and lats   02/11/23 Initial eval THERAPEUTIC EXERCISE: to improve flexibility, strength and mobility.  Verbal and tactile cues throughout for technique. 3-way doorway L pec stretch x 30" each Standing GTB row + Scap retraction/depression 10 x 5" Standing GTB retraction + shoulder extension to neutral 10 x 5"   PATIENT EDUCATION:  Education details: HEP review, role of DN, and DN rational, procedure, outcomes, potential side effects, and recommended post-treatment exercises/activity  Person educated: Patient Education method: Explanation and Handouts Education comprehension: verbalized understanding  HOME EXERCISE PROGRAM: *Access Code: [email or web address] URL: [email or web address] Date: 02/19/2023 Prepared by: Glenetta Hew  Exercises - Doorway Pec Stretch at 60 Degrees Abduction with Arm Straight (Mirrored)  - 2-3 x daily - 7 x weekly - 3 reps - 30 sec hold - Single Arm Doorway Pec Stretch at 90 Degrees Abduction (Mirrored)  - 2-3 x daily - 7 x weekly - 3 reps - 30 sec hold - Single Arm Doorway Pec Stretch at 120 Degrees Abduction (Mirrored)  - 2-3 x daily - 7 x weekly - 3 reps - 30 sec hold - [employer] on [employer]  - 2 x daily -  7 x weekly - 5 reps - 15-20 sec  hold - Standing Bilateral Low Shoulder Row with Anchored Resistance  - 1 x daily - 3 x weekly - 2 sets - 10 reps - 5 sec hold - Scapular Retraction with Resistance Advanced  - 1 x daily - 3 x weekly - 2 sets - 10 reps - 5 sec hold - Standing Lat Pull Down with Resistance - Elbows Bent  - 1 x daily - 3 x weekly - 2 sets - 10 reps - 3 sec hold - Supine Shoulder External Rotation on Foam Roll with Theraband  - 1 x daily - 3 x weekly - 2 sets - 10 reps - 3 sec hold - Supine Shoulder Horizontal Abduction with Resistance  - 1 x daily - 3 x weekly - 2 sets - 10 reps - 3 sec hold - Supine PNF D2 Flexion with Resistance  - 1 x daily - 3 x weekly - 2 sets - 10 reps - 3 sec hold  Patient Education - Trigger Point Dry Needling  *Pt using MedBridgeGO app   ASSESSMENT:  CLINICAL IMPRESSION: Roselynn showed a good response to treatment. She shows improved IR behind the back with less pain. Reviewed HEP, provided instruction and cues with exercises to target the correct muscles.  Pt reports 30% improvement in pain. She is progressing toward goals.   OBJECTIVE IMPAIRMENTS: decreased activity tolerance, decreased knowledge of condition, decreased ROM, decreased strength, increased fascial restrictions, impaired perceived functional ability, increased muscle spasms, impaired flexibility, impaired UE functional use, improper body mechanics, postural dysfunction, and pain.   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   REHAB POTENTIAL: Excellent  CLINICAL DECISION MAKING: Stable/uncomplicated  EVALUATION  COMPLEXITY: Low   GOALS: Goals reviewed with patient? Yes  SHORT TERM GOALS: Target date: 03/04/2023   Patient will be independent with initial HEP to improve outcomes and carryover.  Baseline: Initial HEP provided on eval Goal status: MET  03/04/23  LONG TERM GOALS: Target date: 03/25/2023   Patient will be independent with ongoing/advanced HEP for self-management at home.  Baseline:  Goal status: IN PROGRESS  2.  Patient will report >/= 75% improvement in L shoulder pain to improve QOL.  Baseline: 2/10 at rest, 4-5/10 with triggering activities Goal status: IN PROGRESS- 30%  3.  Patient to demonstrate improved upright posture with posterior shoulder girdle engaged to promote improved glenohumeral joint mobility. Baseline: Mildly/rounded L shoulder Goal status: IN PROGRESS  4.  Patient to improve L shoulder AROM to WNL without pain provocation to allow for increased ease of ADLs.  Baseline: Mild limited ROM with pain at end range of motion Goal status: IN PROGRESS  5.  Patient will demonstrate improved L shoulder strength to 5/5 for functional UE use. Baseline: Refer to above MMT table Goal status: IN PROGRESS  6  Patient will report </= 6% on QuickDASH to demonstrate improved functional ability.  Baseline: 15.9 / 100 = 15.9 % Goal status: IN PROGRESS  7.  Patient will report ability to reach behind her back and don/shirt without increased pain. Baseline: Pain with these activities Goal status: IN PROGRESS - 03/20/23 improved   PLAN:  PT FREQUENCY: 1-2x/week  PT DURATION:  6 weeks  PLANNED INTERVENTIONS: Therapeutic exercises, Therapeutic activity, Neuromuscular re-education, Patient/Family education, Self Care, Joint mobilization, Dry Needling, Electrical stimulation, Cryotherapy, Moist heat, Taping, Vasopneumatic device, Ultrasound, Manual therapy, and Re-evaluation  PLAN FOR NEXT SESSION: Assess response to DN; L shoulder flexibility/stretching - introduce shoulder IR  stretches; progress scapular/postural and L shoulder strengthening; review HEP PRN with regular HEP updates as patient wishing to try 1x/wk frequency to start; MT +/- DN as indicated   Darleene Cleaver, PTA 03/20/2023, 11:53 AM

## 2023-03-26 ENCOUNTER — Ambulatory Visit: Payer: 59

## 2023-03-26 DIAGNOSIS — M6281 Muscle weakness (generalized): Secondary | ICD-10-CM | POA: Diagnosis not present

## 2023-03-26 DIAGNOSIS — M25512 Pain in left shoulder: Secondary | ICD-10-CM

## 2023-03-26 DIAGNOSIS — R293 Abnormal posture: Secondary | ICD-10-CM | POA: Diagnosis not present

## 2023-03-26 NOTE — Therapy (Addendum)
OUTPATIENT PHYSICAL THERAPY PROGRESS REPORT Progress Note Reporting Period 02/11/23 to 03/26/23  See note below for Objective Data and Assessment of Progress/Goals.      Patient Name: Sherry Jacobs MRN: [id number] DOB:Sep 20, 1966, 57 y.o., female Today's Date: 03/26/2023   END OF SESSION:  PT End of Session - 03/26/23 1403     Visit Number 5    Date for PT Re-Evaluation 04/30/23    Authorization Type [facility] - [employer] Save - [id number]    PT Start Time 1402    PT Stop Time 1445    PT Time Calculation (min) 43 min    Activity Tolerance Patient tolerated treatment well    Behavior During Therapy WFL for tasks assessed/performed                 Past Medical History:  Diagnosis Date   ANA positive    Arthralgia    Autoimmune disease (HCC)    ANA positive   Endometrial polyp    Hyperlipidemia    IBS (irritable bowel syndrome)    Iron deficiency anemia    Lactose intolerance in adult    PONV (postoperative nausea and vomiting)    Social anxiety disorder    Wears contact lenses    Past Surgical History:  Procedure Laterality Date   APPENDECTOMY  1981   DILATATION & CURETTAGE/HYSTEROSCOPY WITH MYOSURE N/A 10/28/2014   Procedure: DILATATION & CURETTAGE/HYSTEROSCOPY WITH MYOSURE;  Surgeon: Zelphia Cairo, MD;  Location: [facility];  Service: Gynecology;  Laterality: N/A;   RIGHT MODIFIED NECK DISSECTION W/ REMOVAL BENIGN RIGHT PARAPHARYNGEAL SPACE MASS AND LYMPH NODES  08-02-2004   benign bronchogenic cysts / negative lymph nodes   Patient Active Problem List   Diagnosis Date Noted   Acute pain of left shoulder 12/07/2022   Varicose veins of left leg with edema 08/28/2022   Tinea corporis 08/07/2022   Preventative health care 01/02/2018   URI (upper respiratory infection) 02/19/2017   Neck strain 02/19/2017   High risk medication use 01/02/2017   Right ankle pain 08/05/2013   Cellulitis, toe 03/21/2012   UNSPECIFIED VITAMIN D DEFICIENCY 02/02/2009    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   LEG PAIN, LEFT 10/28/2008   ANXIETY 06/23/2008   UNSPECIFIED OSTEOPOROSIS 12/29/2007   HYPERLIPIDEMIA 10/02/2007   INJURY, 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    PAIN: Are you having pain? 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 Yes. Number of falls 1 - at time of injury  LIVING ENVIRONMENT: Lives with: lives with their family and lives with their spouse - husband and two kids Lives in: House/apartment Stairs: Yes: Internal: 14 steps; on right going up Has following equipment at home: None  OCCUPATION: FT - Automotive engineer at [employer]  PLOF: Independent and Leisure: time with family, pilates 3x/wk   PATIENT GOALS: "To get full function back w/o pain and not have to do surgery."   OBJECTIVE: (objective measures completed at initial  evaluation unless otherwise dated)  DIAGNOSTIC FINDINGS:  N/A  PATIENT SURVEYS:  Quick Dash 15.9 / 100 = 15.9 %  COGNITION: Overall cognitive status: Within functional limits for tasks assessed     SENSATION: WFL  POSTURE: rounded shoulders L>R  UPPER EXTREMITY ROM:   Active ROM Right eval Left eval Left 03/26/23  Shoulder flexion 150 140 * 150  Shoulder extension 48 48   Shoulder abduction 171 148 * 165- mild pain at end range  Shoulder adduction     Shoulder internal rotation FIR WNL FIR WNL * FIR- to T6  Shoulder external rotation FER WNL FER WNL * FER - to T2  Elbow flexion     Elbow extension     Wrist flexion     Wrist extension     Wrist ulnar deviation     Wrist radial deviation     Wrist pronation     Wrist supination     (Blank rows = not tested, * = pain)  UPPER EXTREMITY MMT:  MMT Right eval Left eval Left 03/26/23  Shoulder flexion 5 4+ * 5  Shoulder extension 5 4+   Shoulder abduction 5 4+ * 4+- mild pain  Shoulder adduction     Shoulder internal rotation 5 4+ * 5  Shoulder external rotation 5 4 4+  Middle trapezius     Lower trapezius     Elbow flexion     Elbow extension     Wrist flexion     Wrist extension     Wrist ulnar deviation     Wrist radial deviation     Wrist pronation     Wrist supination     Grip strength (lbs)     (Blank rows = not tested, * = pain)  SHOULDER SPECIAL TESTS: Impingement tests: Neer impingement test: positive , Hawkins/Kennedy impingement test: positive , and Painful arc test: negative Rotator cuff assessment: Empty can test: positive  and Full can test: positive   JOINT MOBILITY TESTING:  WNL  PALPATION:  Increased muscle tension and TTP over L anterior deltoid & pecs    TODAY'S TREATMENT:  03/26/23 THERAPEUTIC EXERCISE: to improve flexibility, strength and mobility.  Verbal and tactile cues throughout for technique.  Pulleys 3 min flexion and scaption  UE MMT and AROM measured QuickDASH Score:  15.9 / 100 = 15.9 % S/L L shoulder abduction x 10 - cues to avoid pain S/L L shoulder horiz ABD x 10  S/L L shoulder ER  x 10   4/12/30/22 THERAPEUTIC EXERCISE: to improve flexibility, strength and mobility.  Verbal and tactile cues throughout for technique.  Pulleys 3 min flexion and scaption  Reviewed 3 way pec stretches in doorway Snow angels on foam roll reviewed Alternate diagonals GTB on foam roll x 10 both directions Horiz ABD GTB on foam roll 2x10  B ER on foam roll GTB 2x10  Standing shoulder ext GTB and row x 10  Standing lat pull down GTB x 10 AAROM IR behind back x 10   03/04/23 THERAPEUTIC EXERCISE: to improve flexibility, strength and mobility.  Verbal and tactile cues throughout for technique.  NuStep - L5 x 6 min L low/mid/high doorway pec stretch x 30" - better tolerance for mid position following DN B GTB scap retraction & shoulder ER x 10, standing with back against doorframe to facilitate scap retraction B GTB scap retraction & shoulder horiz ABD x 10, standing with back against doorframe to facilitate scap retraction B GTB scap retraction & alt shoulder horiz ABD diagonals x 10, standing with back against doorframe to facilitate scap retraction Standing GTB row + scap retraction/depression 10 x 5" Standing GTB retraction + shoulder extension to neutral 10 x 5" Standing GTB lat pulldown + scap retraction/depression 10 x 5"  MANUAL THERAPY: To promote normalized muscle tension, improved flexibility, improved joint mobility, increased ROM, and reduced pain. Skilled palpation and monitoring of soft tissue during DN Trigger Point Dry-Needling  Treatment instructions: Expect mild to moderate muscle soreness. S/S of pneumothorax if dry needled over a lung field, and to seek immediate medical attention should they occur. Patient verbalized understanding of these instructions and education. Patient Consent Given: Yes Education handout provided: Previously provided Muscles  treated: L anterior and lateral deltoid, L pecs Electrical stimulation performed: No Parameters: N/A Treatment response/outcome: Twitch Response Elicited and Palpable Increase in Muscle Length STM/DTM, manual TPR and pin & stretch to muscles addressed with DN   02/19/23 THERAPEUTIC EXERCISE: to improve flexibility, strength and mobility.  Verbal and tactile cues throughout for technique.  UBE: L2.0 x 6 min (3' each fwd & back) L single UE doorway pec stretch x 30" each - increased pain/discomfort at 90 > 120, therefore 90 deferred from HEP and pt encourage to lessen intensity at 120 Hooklying snow angel pec stretch over pool noodle 3 x 30" B GTB scap retraction & shoulder ER 2 x 10, hooklying over pool noodle B GTB scap retraction & shoulder horiz ABD 2 x 10, hooklying over pool noodle B GTB scap retraction & alt shoulder horiz ABD diagonals 2 x 10, hooklying over pool noodle Standing GTB row + scap retraction/depression 10 x 5", 2 sets Standing GTB retraction + shoulder extension to neutral 10 x 5", 2 sets Standing GTB lat pulldown + scap retraction/depression 10 x 5"  MANUAL THERAPY: To promote normalized muscle tension, improved flexibility, improved joint mobility, increased ROM, and reduced pain.  STM/DTM and manual TPR to L anterior and lateral deltoids, teres group and lats   02/11/23 Initial eval THERAPEUTIC EXERCISE: to improve flexibility, strength and mobility.  Verbal and tactile cues throughout for technique. 3-way doorway L pec stretch x 30" each Standing GTB row + Scap retraction/depression 10 x 5" Standing GTB retraction + shoulder extension to neutral 10 x 5"   PATIENT EDUCATION:  Education details: HEP review, role of DN, and DN rational, procedure, outcomes, potential side effects, and recommended post-treatment exercises/activity  Person educated: Patient Education method: Explanation and Handouts Education comprehension:  verbalized understanding  HOME  EXERCISE PROGRAM: *Access Code: [email or web address] URL: [email or web address] Date: 02/19/2023 Prepared by: Glenetta Hew  Exercises - Doorway Pec Stretch at 60 Degrees Abduction with Arm Straight (Mirrored)  - 2-3 x daily - 7 x weekly - 3 reps - 30 sec hold - Single Arm Doorway Pec Stretch at 90 Degrees Abduction (Mirrored)  - 2-3 x daily - 7 x weekly - 3 reps - 30 sec hold - Single Arm Doorway Pec Stretch at 120 Degrees Abduction (Mirrored)  - 2-3 x daily - 7 x weekly - 3 reps - 30 sec hold - Snow Angels on [employer]  - 2 x daily - 7 x weekly - 5 reps - 15-20 sec hold - Standing Bilateral Low Shoulder Row with Anchored Resistance  - 1 x daily - 3 x weekly - 2 sets - 10 reps - 5 sec hold - Scapular Retraction with Resistance Advanced  - 1 x daily - 3 x weekly - 2 sets - 10 reps - 5 sec hold - Standing Lat Pull Down with Resistance - Elbows Bent  - 1 x daily - 3 x weekly - 2 sets - 10 reps - 3 sec hold - Supine Shoulder External Rotation on Foam Roll with Theraband  - 1 x daily - 3 x weekly - 2 sets - 10 reps - 3 sec hold - Supine Shoulder Horizontal Abduction with Resistance  - 1 x daily - 3 x weekly - 2 sets - 10 reps - 3 sec hold - Supine PNF D2 Flexion with Resistance  - 1 x daily - 3 x weekly - 2 sets - 10 reps - 3 sec hold  Patient Education - Trigger Point Dry Needling  *Pt using MedBridgeGO app   ASSESSMENT:  CLINICAL IMPRESSION: Glendi demonstrates improved strength and AROM of L shoulder. She has pain with end range ABD and FIR, also with resisted shoulder ABD. QuickDash score has remained the same. Spoke to her about correct postural alignment today and engaging posterior shoulder complex to improve alignment. Her biggest complaint is pain with end range abduction and with reaching behind her back. She would continue to benefit form skilled therapy to improve pain with donning/doffing bra and shirts as well other functional movements of L shoulder. Agree with assessment:  Amy  Speaks, PT, DPT OBJECTIVE IMPAIRMENTS: decreased activity tolerance, decreased knowledge of condition, decreased ROM, decreased strength, increased fascial restrictions, impaired perceived functional ability, increased muscle spasms, impaired flexibility, impaired UE functional use, improper body mechanics, postural dysfunction, and pain.   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   REHAB POTENTIAL: Excellent  CLINICAL DECISION MAKING: Stable/uncomplicated  EVALUATION COMPLEXITY: Low   GOALS: Goals reviewed with patient? Yes  SHORT TERM GOALS: Target date: 03/04/2023   Patient will be independent with initial HEP to improve outcomes and carryover.  Baseline: Initial HEP provided on eval Goal status: MET  03/04/23  LONG TERM GOALS: Target date: 03/25/2023 extended to 04/30/23   Patient will be independent with ongoing/advanced HEP for self-management at home.  Baseline:  Goal status: IN PROGRESS  2.  Patient will report >/= 75% improvement in L shoulder pain to improve QOL.  Baseline: 2/10 at rest, 4-5/10 with  triggering activities Goal status: IN PROGRESS- 30% 03/20/23   3.  Patient to demonstrate improved upright posture with posterior shoulder girdle engaged to promote improved glenohumeral joint mobility. Baseline: Mildly/rounded L shoulder Goal status: IN PROGRESS- 03/26/23  4.  Patient to improve L shoulder AROM to WNL without pain provocation to allow for increased ease of ADLs.  Baseline: Mild limited ROM with pain at end range of motion Goal status: IN PROGRESS- 03/26/23 mild  pain with abduction and FIR  5.  Patient will demonstrate improved L shoulder strength to 5/5 for functional UE use. Baseline: Refer to above MMT table Goal status: IN PROGRESS- 03/26/23  6  Patient will report </= 6% on QuickDASH to demonstrate improved functional ability.  Baseline: 15.9 / 100 = 15.9 % Goal status: IN PROGRESS- 03/26/23 QuickDASH Score: 15.9 / 100 = 15.9 %  7.  Patient will report ability to reach behind her back and don/shirt without increased pain. Baseline: Pain with these activities Goal status: IN PROGRESS - 03/20/23 improved but increased pain   PLAN:  PT FREQUENCY: 1-2x/week  PT DURATION: 6 weeks  PLANNED INTERVENTIONS: Therapeutic exercises, Therapeutic activity, Neuromuscular re-education, Patient/Family education, Self Care, Joint mobilization, Dry Needling, Electrical stimulation, Cryotherapy, Moist heat, Taping, Vasopneumatic device, Ultrasound, Manual therapy, and Re-evaluation  PLAN FOR NEXT SESSION: try DN again; L shoulder IR stretches; progress scapular/postural and L shoulder strengthening; review HEP PRN with regular HEP updates as patient wishing to try 1x/wk frequency to start;   Caralee Ates, PT, DPT Darleene Cleaver, PTA 03/26/2023, 2:48 PM

## 2023-03-26 NOTE — Addendum Note (Signed)
Addended byCaralee Ates L on: 03/26/2023 06:31 PM   Modules accepted: Orders

## 2023-03-27 NOTE — Progress Notes (Deleted)
Office Visit Note  Patient: Sherry Jacobs             Date of Birth: [birth year]           MRN: [id number]             PCP: Donato Schultz, DO Referring: Donato Schultz, * Visit Date: 04/10/2023 Occupation: @GUAROCC @  Subjective:    History of Present Illness: Sherry Jacobs is a 57 y.o. female with history of autoimmune disease, fibromyalgia, and osteoarthritis.  She discontinued plaquenil in may 2023.  CBC and CMP updated on 09/11/22.  PLQ Eye Exam: 10/27/2021 WNL @ [facility],   Lab work from 09/11/22 was reviewed today in the office: ANA 1:320NH, complements WNL, ESR WNL, dsDNA is negative, and protein creatinine ratio WNL.   Activities of Daily Living:  Patient reports morning stiffness for *** {minute/hour:19697}.   Patient {ACTIONS;DENIES/REPORTS:21021675::"Denies"} nocturnal pain.  Difficulty dressing/grooming: {ACTIONS;DENIES/REPORTS:21021675::"Denies"} Difficulty climbing stairs: {ACTIONS;DENIES/REPORTS:21021675::"Denies"} Difficulty getting out of chair: {ACTIONS;DENIES/REPORTS:21021675::"Denies"} Difficulty using hands for taps, buttons, cutlery, and/or writing: {ACTIONS;DENIES/REPORTS:21021675::"Denies"}  No Rheumatology ROS completed.   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 09/09/2007   Palpitations 09/09/2007   ANA POSITIVE, HX OF 09/09/2007   IRRITABLE  BOWEL SYNDROME 04/11/2007   FIBROMYALGIA 04/11/2007    Past Medical History:  Diagnosis Date   ANA positive    Arthralgia    Autoimmune disease (HCC)    ANA positive   Endometrial polyp    Hyperlipidemia    IBS (irritable bowel syndrome)    Iron deficiency anemia    Lactose intolerance in adult    PONV (postoperative nausea and vomiting)    Social anxiety disorder    Wears contact lenses     Family History  Problem Relation Age of Onset   Breast cancer Mother    Colon polyps Father    Melanoma Maternal Grandfather    Breast cancer Paternal Grandmother    Asthma Daughter        exercise induced    Asthma Son        exercise induced    Diabetes Other    Hypertension Other    Alcohol abuse Other    Hyperlipidemia Other    Arthritis Other    Stroke Other    Thyroid disease Other    Uterine cancer Other    Colon cancer Neg Hx    Esophageal cancer Neg Hx    Stomach cancer Neg Hx    Rectal cancer Neg Hx    Past Surgical History:  Procedure Laterality Date   APPENDECTOMY  1981   DILATATION & CURETTAGE/HYSTEROSCOPY WITH MYOSURE N/A 10/28/2014   Procedure: DILATATION & CURETTAGE/HYSTEROSCOPY WITH MYOSURE;  Surgeon: Zelphia Cairo, MD;  Location: [facility];  Service: Gynecology;  Laterality: N/A;   RIGHT MODIFIED NECK DISSECTION W/ REMOVAL BENIGN RIGHT PARAPHARYNGEAL SPACE MASS AND LYMPH NODES  08-02-2004   benign bronchogenic cysts / negative lymph nodes   Social History   Social History Narrative   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   Living at home with husband and two kids                        Immunization History  Administered Date(s) Administered   COVID-19, mRNA, vaccine(Comirnaty)12 years and older 12/14/2022   H1N1 10/13/2008   Influenza Whole 11/26/2004, 09/15/2008, 08/30/2010   Influenza-Unspecified 08/27/2011, 08/26/2016, 09/05/2019, 09/04/2020   Moderna Covid-19 Vaccine Bivalent Booster 23yrs & up 11/22/2021   PFIZER(Purple  Top)SARS-COV-2 Vaccination 11/17/2019, 12/08/2019   Pneumococcal Polysaccharide-23 05/11/2011   Td 12/15/2008   Tdap 08/06/2022   Unspecified SARS-COV-2 Vaccination 11/02/2020   Varicella 08/07/2021   Zoster Recombinat (Shingrix) 06/06/2021, 08/08/2021   Zoster, Live 02/24/2022     Objective: Vital Signs: LMP 04/07/2017    Physical Exam Vitals and nursing note reviewed.  Constitutional:      Appearance: She is well-developed.  HENT:     Head: Normocephalic and atraumatic.  Eyes:     Conjunctiva/sclera: Conjunctivae normal.  Cardiovascular:     Rate and Rhythm: Normal rate and regular rhythm.     Heart sounds: Normal heart sounds.  Pulmonary:     Effort: Pulmonary effort is normal.     Breath sounds: Normal breath sounds.  Abdominal:     General: Bowel sounds are normal.     Palpations: Abdomen is soft.  Musculoskeletal:     Cervical back: Normal range of motion.  Lymphadenopathy:     Cervical: No cervical adenopathy.  Skin:    General: Skin is warm and dry.     Capillary Refill: Capillary refill takes less than 2 seconds.  Neurological:     Mental Status: She is alert and oriented to person, place, and time.  Psychiatric:        Behavior: Behavior normal.      Musculoskeletal Exam: ***  CDAI Exam: CDAI Score: -- Patient Global: --; Provider Global: -- Swollen: --; Tender: -- Joint Exam 04/10/2023   No joint exam has been documented for this visit   There is currently no information documented on the homunculus. Go to the Rheumatology activity and complete the homunculus joint exam.  Investigation: No additional findings.  Imaging: No results found.  Recent Labs: Lab Results  Component Value Date   WBC 5.4 09/11/2022   HGB 13.7 09/11/2022   PLT 257 09/11/2022   NA 142 09/11/2022   K 4.0 09/11/2022   CL 105 09/11/2022   CO2 26 09/11/2022   GLUCOSE 124 (H) 09/11/2022   BUN 22 09/11/2022   CREATININE 0.99 09/11/2022   BILITOT 0.4 09/11/2022    ALKPHOS 69 09/21/2021   AST 19 09/11/2022   ALT 24 09/11/2022   PROT 7.1 09/11/2022   ALBUMIN 4.8 09/21/2021   CALCIUM 9.8 09/11/2022   GFRAA 88 01/10/2021    Speciality Comments: PLQ Eye Exam: 10/27/2021 WNL @ [facility],  Procedures:  No procedures performed Allergies: Sulfa antibiotics   Assessment / Plan:     Visit Diagnoses: Autoimmune disease (HCC)  High risk medication use  Raynaud's disease without gangrene  Sicca syndrome (HCC)  Primary osteoarthritis of both hands  Fibromyalgia  Other fatigue  Trochanteric bursitis of both hips  Vitamin D deficiency  History of anxiety  History of hyperlipidemia  History of IBS  Orders: No orders of the defined types were placed  in this encounter.  No orders of the defined types were placed in this encounter.   Face-to-face time spent with patient was *** minutes. Greater than 50% of time was spent in counseling and coordination of care.  Follow-Up Instructions: No follow-ups on file.   Ofilia Neas, PA-C  Note - This record has been created using Dragon software.  Chart creation errors have been sought, but may not always  have been located. Such creation errors do not reflect on  the standard of medical care.

## 2023-04-01 DIAGNOSIS — F4320 Adjustment disorder, unspecified: Secondary | ICD-10-CM | POA: Diagnosis not present

## 2023-04-08 ENCOUNTER — Ambulatory Visit: Payer: 59 | Admitting: Physical Therapy

## 2023-04-09 DIAGNOSIS — F4320 Adjustment disorder, unspecified: Secondary | ICD-10-CM | POA: Diagnosis not present

## 2023-04-10 ENCOUNTER — Ambulatory Visit: Payer: 59 | Admitting: Physician Assistant

## 2023-04-10 DIAGNOSIS — E559 Vitamin D deficiency, unspecified: Secondary | ICD-10-CM

## 2023-04-10 DIAGNOSIS — Z79899 Other long term (current) drug therapy: Secondary | ICD-10-CM

## 2023-04-10 DIAGNOSIS — Z8719 Personal history of other diseases of the digestive system: Secondary | ICD-10-CM

## 2023-04-10 DIAGNOSIS — M19042 Primary osteoarthritis, left hand: Secondary | ICD-10-CM

## 2023-04-10 DIAGNOSIS — R5383 Other fatigue: Secondary | ICD-10-CM

## 2023-04-10 DIAGNOSIS — Z8659 Personal history of other mental and behavioral disorders: Secondary | ICD-10-CM

## 2023-04-10 DIAGNOSIS — M359 Systemic involvement of connective tissue, unspecified: Secondary | ICD-10-CM

## 2023-04-10 DIAGNOSIS — M3500 Sicca syndrome, unspecified: Secondary | ICD-10-CM

## 2023-04-10 DIAGNOSIS — M7062 Trochanteric bursitis, left hip: Secondary | ICD-10-CM

## 2023-04-10 DIAGNOSIS — M797 Fibromyalgia: Secondary | ICD-10-CM

## 2023-04-10 DIAGNOSIS — I7300 Raynaud's syndrome without gangrene: Secondary | ICD-10-CM

## 2023-04-10 DIAGNOSIS — Z8639 Personal history of other endocrine, nutritional and metabolic disease: Secondary | ICD-10-CM

## 2023-04-10 DIAGNOSIS — L42 Pityriasis rosea: Secondary | ICD-10-CM | POA: Diagnosis not present

## 2023-04-10 NOTE — Progress Notes (Signed)
Office Visit Note  Patient: Sherry Jacobs             Date of Birth: 1966-10-20           MRN: [id number]             PCP: Donato Schultz, DO Referring: Donato Schultz, * Visit Date: 04/24/2023 Occupation: @GUAROCC @  Subjective:  Routine follow up    History of Present Illness: Sherry Jacobs is a 57 y.o. female with history of autoimmune disease and osteoarthritis.  Patient discontinued Plaquenil in May 2023.  She has not had any recurrence of arthralgias or joint inflammation since discontinuing.  She denies any signs or symptoms of an autoimmune disease flare.  She has not had any symptoms of Raynaud's phenomenon, recent rashes, hair loss, sores in her mouth or nose, sicca symptoms, or fatigue.  Overall her energy level has been stable.  She denies any new medical conditions.  She denies any recurrent infections.  Patient states that she injured her left shoulder in December when losing her footing going down some stairs.  She states that she was evaluated by her PCP and has been going to physical therapy since February.  She continues to have persistent discomfort but her range of motion has improved.  She denies any other joint pain or joint swelling at this time.  She denies any recurrence of trochanteric bursitis.  She has been going to Pilates again which has been helpful for her strength and range of motion.   Activities of Daily Living:  Patient reports morning stiffness for 0 minutes.   Patient Denies nocturnal pain.  Difficulty dressing/grooming: Denies Difficulty climbing stairs: Denies Difficulty getting out of chair: Denies Difficulty using hands for taps, buttons, cutlery, and/or writing: Denies  Review of Systems  Constitutional:  Negative for fatigue.  HENT:  Negative for mouth sores and mouth dryness.   Eyes:  Negative for dryness.  Respiratory:  Negative for shortness of breath.   Cardiovascular:  Negative for chest pain and palpitations.   Gastrointestinal:  Negative for blood in stool, constipation and diarrhea.  Endocrine: Negative for increased urination.  Genitourinary:  Negative for involuntary urination.  Musculoskeletal:  Negative for joint pain, gait problem, joint pain, joint swelling, myalgias, muscle weakness, morning stiffness, muscle tenderness and myalgias.  Skin:  Negative for color change, rash, hair loss and sensitivity to sunlight.  Allergic/Immunologic: Negative for susceptible to infections.  Neurological:  Negative for dizziness and headaches.  Hematological:  Negative for swollen glands.  Psychiatric/Behavioral:  Positive for sleep disturbance. Negative for depressed mood. The patient is not nervous/anxious.     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 09/09/2007   Palpitations 09/09/2007   ANA POSITIVE, HX OF 09/09/2007   IRRITABLE BOWEL SYNDROME 04/11/2007   FIBROMYALGIA 04/11/2007    Past Medical History:  Diagnosis Date   ANA positive  Arthralgia    Autoimmune disease (HCC)    ANA positive   Endometrial polyp    Hyperlipidemia    IBS (irritable bowel syndrome)    Iron deficiency anemia    Lactose intolerance in adult    PONV (postoperative nausea and vomiting)    Social anxiety disorder    Wears contact lenses     Family History  Problem Relation Age of Onset   Breast cancer Mother    Colon polyps Father     Melanoma Maternal Grandfather    Breast cancer Paternal Grandmother    Asthma Daughter        exercise induced    Asthma Son        exercise induced    Diabetes Other    Hypertension Other    Alcohol abuse Other    Hyperlipidemia Other    Arthritis Other    Stroke Other    Thyroid disease Other    Uterine cancer Other    Colon cancer Neg Hx    Esophageal cancer Neg Hx    Stomach cancer Neg Hx    Rectal cancer Neg Hx    Past Surgical History:  Procedure Laterality Date   APPENDECTOMY  1981   DILATATION & CURETTAGE/HYSTEROSCOPY WITH MYOSURE N/A 10/28/2014   Procedure: DILATATION & CURETTAGE/HYSTEROSCOPY WITH MYOSURE;  Surgeon: Zelphia Cairo, MD;  Location: [facility] [place];  Service: Gynecology;  Laterality: N/A;   RIGHT MODIFIED NECK DISSECTION W/ REMOVAL BENIGN RIGHT PARAPHARYNGEAL SPACE MASS AND LYMPH NODES  08-02-2004   benign bronchogenic cysts / negative lymph nodes   Social History   Social History Narrative   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   Living at home with husband and two kids                        Immunization History  Administered Date(s) Administered   COVID-19, mRNA, vaccine(Comirnaty)12 years and older 12/14/2022   H1N1 10/13/2008   Influenza Whole 11/26/2004, 09/15/2008, 08/30/2010   Influenza-Unspecified 08/27/2011, 08/26/2016, 09/05/2019, 09/04/2020   Moderna Covid-19 Vaccine Bivalent Booster 39yrs & up 11/22/2021   PFIZER(Purple Top)SARS-COV-2 Vaccination 11/17/2019, 12/08/2019   Pneumococcal Polysaccharide-23 05/11/2011   Td 12/15/2008   Tdap 08/06/2022   Unspecified SARS-COV-2 Vaccination 11/02/2020   Varicella 08/07/2021   Zoster Recombinat (Shingrix) 06/06/2021, 08/08/2021   Zoster, Live 02/24/2022     Objective: Vital Signs: BP 113/76 (BP Location: Left Arm, Patient Position: Sitting, Cuff Size: Normal)   Pulse 73   Resp 15   Ht 5\' 7"  (1.702 m)   Wt 204 lb (92.5 kg)   LMP 04/07/2017   BMI 31.95 kg/m     Physical Exam Vitals and nursing note reviewed.  Constitutional:      Appearance: She is well-developed.  HENT:     Head: Normocephalic and atraumatic.  Eyes:     Conjunctiva/sclera: Conjunctivae normal.  Cardiovascular:     Rate and Rhythm: Normal rate and regular rhythm.     Heart sounds: Normal heart sounds.  Pulmonary:     Effort: Pulmonary effort is normal.     Breath sounds: Normal breath sounds.  Abdominal:     General: Bowel sounds are normal.     Palpations: Abdomen is soft.  Musculoskeletal:     Cervical back: Normal range of motion.  Skin:    General: Skin is warm and dry.     Capillary Refill: Capillary refill takes less than 2 seconds.  Comments: No malar rash No digital ulcers or signs of gangrene   Neurological:     Mental Status: She is alert and oriented to person, place, and time.  Psychiatric:        Behavior: Behavior normal.      Musculoskeletal Exam: C-spine, thoracic spine, lumbar spine have good range of motion.  Shoulder joints, elbow joints, wrist joints, MCPs, PIPs, DIPs have good range of motion with no synovitis.  Complete fist formation bilaterally.  Hip joints have good range of motion with no groin pain.  No tenderness over the trochanteric bursa bilaterally.  Knee joints have good range of motion with no warmth or effusion.  Ankle joints have good range of motion with no tenderness or joint swelling.  CDAI Exam: CDAI Score: -- Patient Global: --; Provider Global: -- Swollen: --; Tender: -- Joint Exam 04/24/2023   No joint exam has been documented for this visit   There is currently no information documented on the homunculus. Go to the Rheumatology activity and complete the homunculus joint exam.  Investigation: No additional findings.  Imaging: No results found.  Recent Labs: Lab Results  Component Value Date   WBC 5.4 09/11/2022   HGB 13.7 09/11/2022   PLT 257 09/11/2022   NA 142 09/11/2022   K 4.0 09/11/2022   CL 105  09/11/2022   CO2 26 09/11/2022   GLUCOSE 124 (H) 09/11/2022   BUN 22 09/11/2022   CREATININE 0.99 09/11/2022   BILITOT 0.4 09/11/2022   ALKPHOS 69 09/21/2021   AST 19 09/11/2022   ALT 24 09/11/2022   PROT 7.1 09/11/2022   ALBUMIN 4.8 09/21/2021   CALCIUM 9.8 09/11/2022   GFRAA 88 01/10/2021    Speciality Comments: PLQ Eye Exam: 10/27/2021 WNL @ [facility],  Procedures:  No procedures performed Allergies: Sulfa antibiotics   Assessment / Plan:     Visit Diagnoses: Autoimmune disease (HCC) - +ANA, low C3, arthralgia, fatigue, Sicca symptoms, and Raynaud's: She has not had any signs or symptoms of an autoimmune disease flare.  She discontinued Plaquenil in May 2023 and has not had any recurrence of symptoms.  She has no synovitis on examination today.  Her energy level has been stable.  She has not had any shortness of breath, palpitations, or pleuritic chest pain.  No recent rashes, hair loss, Raynaud's phenomenon, oral or nasal ulcerations, or sicca symptoms.  Good capillary refill was noted on examination today. No malar rash noted.   Lab work from 09/11/2022 was reviewed today in the office: Protein creatinine ratio within normal limits, complements within normal limits, ESR within normal limits, double-stranded DNA negative, ANA 1: 320NH.   The following lab work will be obtained today for further evaluation.  She does not require Plaquenil or any other immunosuppressive agents at this time.  She is advised to notify us if she develops any new or worsening symptoms.  She will follow-up in the office in 6 months or sooner if needed.- Plan: Protein / creatinine ratio, urine, CBC with Differential/Platelet, ANA, COMPLETE METABOLIC PANEL WITH GFR, Anti-DNA antibody, double-stranded, Sedimentation rate, C3 and C4  High risk medication use - Discontinued hydroxychloroquine in May 2023.  (dsDNA-, complements WNL, and ESR WNL). PLQ Eye Exam: 10/27/2021 - Plan: CBC with  Differential/Platelet, COMPLETE METABOLIC PANEL WITH GFR  Raynaud's disease without gangrene: Not currently active.  No digital ulcerations or signs of gangrene were noted.  Good capillary refill noted.  Sicca syndrome (HCC): She has not been experiencing  any sicca symptoms.  Primary osteoarthritis of both hands: No tenderness or inflammation noted on examination today.  Complete fist formation bilaterally.  Trochanteric bursitis of both hips: No tenderness to palpation today.  She has been practicing Pilates on a regular basis for exercise.  Other medical conditions are listed as follows:   Vitamin D deficiency  History of anxiety  History of hyperlipidemia  Fibromyalgia: No flares.  She practices Pilates on a regular basis.  Other fatigue: Stable.    History of IBS  Orders: Orders Placed This Encounter  Procedures   Protein / creatinine ratio, urine   CBC with Differential/Platelet   ANA   COMPLETE METABOLIC PANEL WITH GFR   Anti-DNA antibody, double-stranded   Sedimentation rate   C3 and C4   No orders of the defined types were placed in this encounter.    Follow-Up Instructions: Return in about 6 months (around 10/25/2023).   Gearldine Bienenstock, PA-C  Note - This record has been created using Dragon software.  Chart creation errors have been sought, but may not always  have been located. Such creation errors do not reflect on  the standard of medical care.

## 2023-04-15 ENCOUNTER — Ambulatory Visit: Payer: 59 | Attending: Family Medicine

## 2023-04-15 DIAGNOSIS — M6281 Muscle weakness (generalized): Secondary | ICD-10-CM | POA: Insufficient documentation

## 2023-04-15 DIAGNOSIS — M25512 Pain in left shoulder: Secondary | ICD-10-CM

## 2023-04-15 DIAGNOSIS — R293 Abnormal posture: Secondary | ICD-10-CM | POA: Insufficient documentation

## 2023-04-15 NOTE — Therapy (Signed)
OUTPATIENT PHYSICAL THERAPY TREATMENT      Patient Name: Sherry Jacobs MRN: [id number] DOB:[birth year], 57 y.o., female Today's Date: 04/15/2023   END OF SESSION:  PT End of Session - 04/15/23 1319     Visit Number 6    Date for PT Re-Evaluation 04/30/23    Authorization Type [facility] - [employer] - [id number]    PT Start Time 1315    PT Stop Time 1400    PT Time Calculation (min) 45 min    Activity Tolerance Patient tolerated treatment well    Behavior During Therapy WFL for tasks assessed/performed                  Past Medical History:  Diagnosis Date   ANA positive    Arthralgia    Autoimmune disease (HCC)    ANA positive   Endometrial polyp    Hyperlipidemia    IBS (irritable bowel syndrome)    Iron deficiency anemia    Lactose intolerance in adult    PONV (postoperative nausea and vomiting)    Social anxiety disorder    Wears contact lenses    Past Surgical History:  Procedure Laterality Date   APPENDECTOMY  1981   DILATATION & CURETTAGE/HYSTEROSCOPY WITH MYOSURE N/A 10/28/2014   Procedure: DILATATION & CURETTAGE/HYSTEROSCOPY WITH MYOSURE;  Surgeon: Zelphia Cairo, MD;  Location: [facility] [place];  Service: Gynecology;  Laterality: N/A;   RIGHT MODIFIED NECK DISSECTION W/ REMOVAL BENIGN RIGHT PARAPHARYNGEAL SPACE MASS AND LYMPH NODES  08-02-2004   benign bronchogenic cysts / negative lymph nodes   Patient Active Problem List   Diagnosis Date Noted   Acute pain of left shoulder 12/07/2022   Varicose veins of left leg with edema 08/28/2022   Tinea corporis 08/07/2022   Preventative health care 01/02/2018   URI (upper respiratory infection) 02/19/2017   Neck strain 02/19/2017   High risk medication use 01/02/2017   Right ankle pain 08/05/2013   Cellulitis, toe 03/21/2012   UNSPECIFIED VITAMIN D DEFICIENCY 02/02/2009   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   LEG PAIN, LEFT 10/28/2008   ANXIETY 06/23/2008   UNSPECIFIED OSTEOPOROSIS 12/29/2007    HYPERLIPIDEMIA 10/02/2007   INJURY, 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 09/09/2007   Palpitations 09/09/2007   ANA POSITIVE, HX OF 09/09/2007   IRRITABLE BOWEL SYNDROME 04/11/2007   FIBROMYALGIA 04/11/2007    PCP: Donato Schultz, DO   REFERRING PROVIDER: Donato Schultz, DO  REFERRING DIAG: [phone] (ICD-10-CM) - Acute pain of left shoulder   THERAPY DIAG:  Acute pain of left shoulder  Abnormal posture  Muscle weakness (generalized)  RATIONALE FOR EVALUATION AND TREATMENT: Rehabilitation  ONSET DATE: 10/29/22  NEXT MD VISIT: PRN   SUBJECTIVE:  SUBJECTIVE STATEMENT: Pt reports that she went to Pilates on Saturday, it went easy on the L shoulder so it wasn't too bad.  PAIN: Are you having pain? 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 Yes. Number of falls 1 - at time of injury  LIVING ENVIRONMENT: Lives with: lives with their family and lives with their spouse - husband and two kids Lives in: House/apartment Stairs: Yes: Internal: 14 steps; on right going up Has following equipment at home: None  OCCUPATION: FT - Automotive engineer at [employer]  PLOF: Independent and Leisure: time with family, pilates 3x/wk   PATIENT GOALS: "To get full function back w/o pain and not have to do surgery."   OBJECTIVE: (objective measures completed at initial evaluation unless otherwise dated)  DIAGNOSTIC FINDINGS:  N/A  PATIENT SURVEYS:  Quick Dash 15.9 / 100 = 15.9 %  COGNITION: Overall cognitive status: Within functional limits for tasks  assessed     SENSATION: WFL  POSTURE: rounded shoulders L>R  UPPER EXTREMITY ROM:   Active ROM Right eval Left eval Left 03/26/23 Left 04/15/23   Shoulder flexion 150 140 * 150 151  Shoulder extension 48 48    Shoulder abduction 171 148 * 165- mild pain at end range 171- mild pain at end  Shoulder adduction      Shoulder internal rotation FIR WNL FIR WNL * FIR- to T6 FIR- T6  Shoulder external rotation FER WNL FER WNL * FER - to T2 FER- T3  Elbow flexion      Elbow extension      Wrist flexion      Wrist extension      Wrist ulnar deviation      Wrist radial deviation      Wrist pronation      Wrist supination      (Blank rows = not tested, * = pain)  UPPER EXTREMITY MMT:  MMT Right eval Left eval Left 03/26/23 Left 04/15/23  Shoulder flexion 5 4+ * 5 5  Shoulder extension 5 4+    Shoulder abduction 5 4+ * 4+- mild pain 4+- mild pain  Shoulder adduction      Shoulder internal rotation 5 4+ * 5 5  Shoulder external rotation 5 4 4+ 5  Middle trapezius      Lower trapezius      Elbow flexion      Elbow extension      Wrist flexion      Wrist extension      Wrist ulnar deviation      Wrist radial deviation      Wrist pronation      Wrist supination      Grip strength (lbs)      (Blank rows = not tested, * = pain)  SHOULDER SPECIAL TESTS: Impingement tests: Neer impingement test: positive , Hawkins/Kennedy impingement test: positive , and Painful arc test: negative Rotator cuff assessment: Empty can test: positive  and Full can test: positive   JOINT MOBILITY TESTING:  WNL  PALPATION:  Increased muscle tension and TTP over L anterior deltoid & pecs    TODAY'S TREATMENT:  04/15/23 THERAPEUTIC EXERCISE: to improve flexibility, strength and mobility.  Verbal and tactile cues throughout for technique.  UBE L1.0 3 min fwd/back  TRX rows x 10 with scap retraction Lat pull down 25# 2x10 Prone horizontal ABD 2x10 2# B  MANUAL THERAPY: To promote normalized  muscle tension, improved flexibility, improved joint mobility, increased ROM, and reduced pain. STM to L anterior deltoid, biceps  Therapeutic Activity: MMT and ROM assessed  03/26/23 THERAPEUTIC EXERCISE: to improve flexibility, strength and mobility.  Verbal and tactile cues throughout for technique.  Pulleys 3 min flexion and scaption  UE MMT and AROM measured QuickDASH Score: 15.9 / 100 = 15.9 % S/L L shoulder abduction x 10 - cues to avoid pain S/L L shoulder horiz ABD x 10  S/L L shoulder ER x 10   4/12/30/22 THERAPEUTIC EXERCISE: to improve flexibility, strength and mobility.  Verbal and tactile cues throughout for technique.  Pulleys 3 min flexion and scaption  Reviewed 3 way pec stretches in doorway Snow angels on foam roll reviewed Alternate diagonals GTB on foam roll x 10 both directions Horiz ABD GTB on foam roll 2x10  B ER on foam roll GTB 2x10  Standing shoulder ext GTB and row x 10  Standing lat pull down GTB x 10 AAROM IR behind back x 10   03/04/23 THERAPEUTIC EXERCISE: to improve flexibility, strength and mobility.  Verbal and tactile cues throughout for technique.  NuStep - L5 x 6 min L low/mid/high doorway pec stretch x 30" - better tolerance for mid position following DN B GTB scap retraction & shoulder ER x 10, standing with back against doorframe to facilitate scap retraction B GTB scap retraction & shoulder horiz ABD x 10, standing with back against doorframe to facilitate scap retraction B GTB scap retraction & alt shoulder horiz ABD diagonals x 10, standing with back against doorframe to facilitate scap retraction Standing GTB row + scap retraction/depression 10 x 5" Standing GTB retraction + shoulder extension to neutral 10 x 5" Standing GTB lat pulldown + scap retraction/depression 10 x 5"  MANUAL THERAPY: To promote normalized muscle tension, improved flexibility, improved joint mobility, increased ROM, and reduced pain. Skilled palpation and monitoring  of soft tissue during DN Trigger Point Dry-Needling  Treatment instructions: Expect mild to moderate muscle soreness. S/S of pneumothorax if dry needled over a lung field, and to seek immediate medical attention should they occur. Patient verbalized understanding of these instructions and education. Patient Consent Given: Yes Education handout provided: Previously provided Muscles treated: L anterior and lateral deltoid, L pecs Electrical stimulation performed: No Parameters: N/A Treatment response/outcome: Twitch Response Elicited and Palpable Increase in Muscle Length STM/DTM, manual TPR and pin & stretch to muscles addressed with DN   02/19/23 THERAPEUTIC EXERCISE: to improve flexibility, strength and mobility.  Verbal and tactile cues throughout for technique.  UBE: L2.0 x 6 min (3' each fwd & back) L single UE doorway pec stretch x 30" each - increased pain/discomfort at 90 > 120, therefore 90 deferred from HEP and pt encourage to lessen intensity at 120 Hooklying snow angel pec stretch over pool noodle 3 x 30" B GTB scap retraction & shoulder ER 2 x 10, hooklying over pool noodle B GTB scap retraction & shoulder horiz ABD 2 x 10, hooklying over pool noodle B GTB scap retraction & alt shoulder horiz ABD diagonals 2 x 10, hooklying over pool noodle Standing GTB row + scap retraction/depression 10 x 5", 2 sets Standing GTB retraction + shoulder extension to neutral 10 x 5", 2 sets Standing GTB lat pulldown + scap retraction/depression 10 x 5"  MANUAL THERAPY: To promote  normalized muscle tension, improved flexibility, improved joint mobility, increased ROM, and reduced pain.  STM/DTM and manual TPR to L anterior and lateral deltoids, teres group and lats   02/11/23 Initial eval THERAPEUTIC EXERCISE: to improve flexibility, strength and mobility.  Verbal and tactile cues throughout for technique. 3-way doorway L pec stretch x 30" each Standing GTB row + Scap retraction/depression  10 x 5" Standing GTB retraction + shoulder extension to neutral 10 x 5"   PATIENT EDUCATION:  Education details: HEP review, role of DN, and DN rational, procedure, outcomes, potential side effects, and recommended post-treatment exercises/activity  Person educated: Patient Education method: Explanation and Handouts Education comprehension: verbalized understanding  HOME EXERCISE PROGRAM: *Access Code: [email or web address] URL: [email or web address] Date: 02/19/2023 Prepared by: Glenetta Hew  Exercises - Doorway Pec Stretch at 60 Degrees Abduction with Arm Straight (Mirrored)  - 2-3 x daily - 7 x weekly - 3 reps - 30 sec hold - Single Arm Doorway Pec Stretch at 90 Degrees Abduction (Mirrored)  - 2-3 x daily - 7 x weekly - 3 reps - 30 sec hold - Single Arm Doorway Pec Stretch at 120 Degrees Abduction (Mirrored)  - 2-3 x daily - 7 x weekly - 3 reps - 30 sec hold - Snow Angels on [employer]  - 2 x daily - 7 x weekly - 5 reps - 15-20 sec hold - Standing Bilateral Low Shoulder Row with Anchored Resistance  - 1 x daily - 3 x weekly - 2 sets - 10 reps - 5 sec hold - Scapular Retraction with Resistance Advanced  - 1 x daily - 3 x weekly - 2 sets - 10 reps - 5 sec hold - Standing Lat Pull Down with Resistance - Elbows Bent  - 1 x daily - 3 x weekly - 2 sets - 10 reps - 3 sec hold - Supine Shoulder External Rotation on Foam Roll with Theraband  - 1 x daily - 3 x weekly - 2 sets - 10 reps - 3 sec hold - Supine Shoulder Horizontal Abduction with Resistance  - 1 x daily - 3 x weekly - 2 sets - 10 reps - 3 sec hold - Supine PNF D2 Flexion with Resistance  - 1 x daily - 3 x weekly - 2 sets - 10 reps - 3 sec hold  Patient Education - Trigger Point Dry Needling  *Pt using MedBridgeGO app   ASSESSMENT:  CLINICAL IMPRESSION: Advanced exercises to work on postural muscle strengthening. She was unable to do TRX horizontal ABD w/o inc pain. STM done to L anterior shoulder to relieve stiffness and  tension. She is not having pain unless reaching behind back and into abduction. Improved L shoulder ABD ROM and strength goal almost met except for abduction.  OBJECTIVE IMPAIRMENTS: decreased activity tolerance, decreased knowledge of condition, decreased ROM, decreased strength, increased fascial restrictions, impaired perceived functional ability, increased muscle spasms, impaired flexibility, impaired UE functional use, improper body mechanics, postural dysfunction, and pain.   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   REHAB POTENTIAL: Excellent  CLINICAL DECISION MAKING: Stable/uncomplicated  EVALUATION COMPLEXITY: Low   GOALS: Goals reviewed with patient? Yes  SHORT TERM GOALS: Target date: 03/04/2023   Patient  will be independent with initial HEP to improve outcomes and carryover.  Baseline: Initial HEP provided on eval Goal status: MET  03/04/23  LONG TERM GOALS: Target date: 03/25/2023 extended to 04/30/23   Patient will be independent with ongoing/advanced HEP for self-management at home.  Baseline:  Goal status: IN PROGRESS  2.  Patient will report >/= 75% improvement in L shoulder pain to improve QOL.  Baseline: 2/10 at rest, 4-5/10 with triggering activities Goal status: IN PROGRESS- 30% 03/20/23   3.  Patient to demonstrate improved upright posture with posterior shoulder girdle engaged to promote improved glenohumeral joint mobility. Baseline: Mildly/rounded L shoulder Goal status: IN PROGRESS- 03/26/23  4.  Patient to improve L shoulder AROM to WNL without pain  provocation to allow for increased ease of ADLs.  Baseline: Mild limited ROM with pain at end range of motion Goal status: PARTIALLY MET- 04/15/23 mild pain with abduction and FIR but WNL  5.  Patient will demonstrate improved L shoulder strength to 5/5 for functional UE use. Baseline: Refer to above MMT table Goal status: PARTIALLY MET- 04/15/23 met for every muscle except abduction   6  Patient will report </= 6% on QuickDASH to demonstrate improved functional ability.  Baseline: 15.9 / 100 = 15.9 % Goal status: IN PROGRESS- 03/26/23 QuickDASH Score: 15.9 / 100 = 15.9 %  7.  Patient will report ability to reach behind her back and don/shirt without increased pain. Baseline: Pain with these activities Goal status: IN PROGRESS - 03/20/23 improved but increased pain   PLAN:  PT FREQUENCY: 1-2x/week  PT DURATION: 6 weeks  PLANNED INTERVENTIONS: Therapeutic exercises, Therapeutic activity, Neuromuscular re-education, Patient/Family education, Self Care, Joint mobilization, Dry Needling, Electrical stimulation, Cryotherapy, Moist heat, Taping, Vasopneumatic device, Ultrasound, Manual therapy, and Re-evaluation  PLAN FOR NEXT SESSION: progress scapular/postural and L shoulder strengthening; review HEP PRN with regular HEP updates as patient wishing to try 1x/wk frequency to start;   Darleene Cleaver, PTA 04/15/2023, 2:15 PM

## 2023-04-21 ENCOUNTER — Other Ambulatory Visit: Payer: Self-pay | Admitting: Family Medicine

## 2023-04-23 ENCOUNTER — Other Ambulatory Visit (HOSPITAL_BASED_OUTPATIENT_CLINIC_OR_DEPARTMENT_OTHER): Payer: Self-pay

## 2023-04-23 DIAGNOSIS — F4320 Adjustment disorder, unspecified: Secondary | ICD-10-CM | POA: Diagnosis not present

## 2023-04-23 MED ORDER — ROSUVASTATIN CALCIUM 10 MG PO TABS
10.0000 mg | ORAL_TABLET | Freq: Every day | ORAL | 1 refills | Status: DC
  Filled 2023-04-23: qty 90, 90d supply, fill #0
  Filled 2023-08-30: qty 90, 90d supply, fill #1

## 2023-04-24 ENCOUNTER — Encounter: Payer: Self-pay | Admitting: Physician Assistant

## 2023-04-24 ENCOUNTER — Ambulatory Visit: Payer: 59 | Attending: Physician Assistant | Admitting: Physician Assistant

## 2023-04-24 VITALS — BP 113/76 | HR 73 | Resp 15 | Ht 67.0 in | Wt 204.0 lb

## 2023-04-24 DIAGNOSIS — M359 Systemic involvement of connective tissue, unspecified: Secondary | ICD-10-CM

## 2023-04-24 DIAGNOSIS — M7061 Trochanteric bursitis, right hip: Secondary | ICD-10-CM

## 2023-04-24 DIAGNOSIS — Z79899 Other long term (current) drug therapy: Secondary | ICD-10-CM | POA: Diagnosis not present

## 2023-04-24 DIAGNOSIS — Z8639 Personal history of other endocrine, nutritional and metabolic disease: Secondary | ICD-10-CM

## 2023-04-24 DIAGNOSIS — Z8659 Personal history of other mental and behavioral disorders: Secondary | ICD-10-CM | POA: Diagnosis not present

## 2023-04-24 DIAGNOSIS — M3500 Sicca syndrome, unspecified: Secondary | ICD-10-CM | POA: Diagnosis not present

## 2023-04-24 DIAGNOSIS — M797 Fibromyalgia: Secondary | ICD-10-CM | POA: Diagnosis not present

## 2023-04-24 DIAGNOSIS — M19042 Primary osteoarthritis, left hand: Secondary | ICD-10-CM

## 2023-04-24 DIAGNOSIS — I7300 Raynaud's syndrome without gangrene: Secondary | ICD-10-CM

## 2023-04-24 DIAGNOSIS — M19041 Primary osteoarthritis, right hand: Secondary | ICD-10-CM | POA: Diagnosis not present

## 2023-04-24 DIAGNOSIS — E559 Vitamin D deficiency, unspecified: Secondary | ICD-10-CM | POA: Diagnosis not present

## 2023-04-24 DIAGNOSIS — M7062 Trochanteric bursitis, left hip: Secondary | ICD-10-CM

## 2023-04-24 DIAGNOSIS — R5383 Other fatigue: Secondary | ICD-10-CM

## 2023-04-24 DIAGNOSIS — Z8719 Personal history of other diseases of the digestive system: Secondary | ICD-10-CM

## 2023-04-25 LAB — CBC WITH DIFFERENTIAL/PLATELET
Basophils Absolute: 39 cells/uL (ref 0–200)
Basophils Relative: 0.7 %
Eosinophils Absolute: 140 cells/uL (ref 15–500)
HCT: 40.3 % (ref 35.0–45.0)
Hemoglobin: 13.7 g/dL (ref 11.7–15.5)
Lymphs Abs: 1646 cells/uL (ref 850–3900)
MCH: 28.5 pg (ref 27.0–33.0)
MCV: 84 fL (ref 80.0–100.0)
MPV: 11 fL (ref 7.5–12.5)
Neutro Abs: 3265 cells/uL (ref 1500–7800)
Neutrophils Relative %: 58.3 %
Platelets: 272 10*3/uL (ref 140–400)
RDW: 13.2 % (ref 11.0–15.0)
WBC: 5.6 10*3/uL (ref 3.8–10.8)

## 2023-04-25 LAB — COMPLETE METABOLIC PANEL WITH GFR
AST: 22 U/L (ref 10–35)
Alkaline phosphatase (APISO): 61 U/L (ref 37–153)
BUN: 21 mg/dL (ref 7–25)
Glucose, Bld: 89 mg/dL (ref 65–99)
Potassium: 4.3 mmol/L (ref 3.5–5.3)
Sodium: 139 mmol/L (ref 135–146)
Total Protein: 7 g/dL (ref 6.1–8.1)
eGFR: 78 mL/min/{1.73_m2} (ref >=60)

## 2023-04-25 LAB — SEDIMENTATION RATE: Sed Rate: 11 mm/h (ref 0–30)

## 2023-04-25 NOTE — Progress Notes (Signed)
No proteinuria

## 2023-04-25 NOTE — Progress Notes (Signed)
Complements WNL

## 2023-04-25 NOTE — Progress Notes (Signed)
CBC and CMP WNL.  ESR WNL.

## 2023-04-26 LAB — COMPLETE METABOLIC PANEL WITH GFR
AG Ratio: 1.9 (calc) (ref 1.0–2.5)
ALT: 26 U/L (ref 6–29)
Albumin: 4.6 g/dL (ref 3.6–5.1)
CO2: 29 mmol/L (ref 20–32)
Calcium: 9.7 mg/dL (ref 8.6–10.4)
Chloride: 102 mmol/L (ref 98–110)
Creat: 0.86 mg/dL (ref 0.50–1.03)
Globulin: 2.4 g/dL (calc) (ref 1.9–3.7)
Total Bilirubin: 0.4 mg/dL (ref 0.2–1.2)

## 2023-04-26 LAB — ANA: Anti Nuclear Antibody (ANA): POSITIVE — AB

## 2023-04-26 LAB — PROTEIN / CREATININE RATIO, URINE
Creatinine, Urine: 54 mg/dL (ref 20–275)
Protein/Creat Ratio: 74 mg/g creat (ref 24–184)
Protein/Creatinine Ratio: 0.074 mg/mg creat (ref 0.024–0.184)
Total Protein, Urine: 4 mg/dL — ABNORMAL LOW (ref 5–24)

## 2023-04-26 LAB — CBC WITH DIFFERENTIAL/PLATELET
Absolute Monocytes: 510 cells/uL (ref 200–950)
Eosinophils Relative: 2.5 %
MCHC: 34 g/dL (ref 32.0–36.0)
Monocytes Relative: 9.1 %
RBC: 4.8 10*6/uL (ref 3.80–5.10)
Total Lymphocyte: 29.4 %

## 2023-04-26 LAB — ANTI-NUCLEAR AB-TITER (ANA TITER): ANA Titer 1: 1:320 {titer} — ABNORMAL HIGH

## 2023-04-26 LAB — C3 AND C4
C3 Complement: 142 mg/dL (ref 83–193)
C4 Complement: 36 mg/dL (ref 15–57)

## 2023-04-26 LAB — ANTI-DNA ANTIBODY, DOUBLE-STRANDED: ds DNA Ab: <1 IU/mL

## 2023-04-26 NOTE — Progress Notes (Signed)
dsDNA is negative

## 2023-04-29 ENCOUNTER — Encounter: Payer: Self-pay | Admitting: Physical Therapy

## 2023-04-29 ENCOUNTER — Ambulatory Visit: Payer: 59 | Attending: Family Medicine | Admitting: Physical Therapy

## 2023-04-29 DIAGNOSIS — M6281 Muscle weakness (generalized): Secondary | ICD-10-CM | POA: Diagnosis not present

## 2023-04-29 DIAGNOSIS — H40033 Anatomical narrow angle, bilateral: Secondary | ICD-10-CM | POA: Diagnosis not present

## 2023-04-29 DIAGNOSIS — R293 Abnormal posture: Secondary | ICD-10-CM | POA: Insufficient documentation

## 2023-04-29 DIAGNOSIS — M25512 Pain in left shoulder: Secondary | ICD-10-CM | POA: Insufficient documentation

## 2023-04-29 DIAGNOSIS — Z79899 Other long term (current) drug therapy: Secondary | ICD-10-CM | POA: Diagnosis not present

## 2023-04-29 DIAGNOSIS — H04123 Dry eye syndrome of bilateral lacrimal glands: Secondary | ICD-10-CM | POA: Diagnosis not present

## 2023-04-29 NOTE — Therapy (Addendum)
OUTPATIENT PHYSICAL THERAPY TREATMENT   Progress Note  Reporting Period 02/11/2023 to 04/29/2023  See note below for Objective Data and Assessment of Progress/Goals.    Patient Name: Sherry Jacobs MRN: [id number] DOB:[birth year], 57 y.o., female Today's Date: 04/29/2023   END OF SESSION:  PT End of Session - 04/29/23 1530     Visit Number 7    Date for PT Re-Evaluation 04/30/23    Authorization Type [facility] - [employer] Save - [id number]    PT Start Time 1530    PT Stop Time 1604    PT Time Calculation (min) 34 min    Activity Tolerance Patient tolerated treatment well    Behavior During Therapy WFL for tasks assessed/performed                   Past Medical History:  Diagnosis Date   ANA positive    Arthralgia    Autoimmune disease (HCC)    ANA positive   Endometrial polyp    Hyperlipidemia    IBS (irritable bowel syndrome)    Iron deficiency anemia    Lactose intolerance in adult    PONV (postoperative nausea and vomiting)    Social anxiety disorder    Wears contact lenses    Past Surgical History:  Procedure Laterality Date   APPENDECTOMY  1981   DILATATION & CURETTAGE/HYSTEROSCOPY WITH MYOSURE N/A 10/28/2014   Procedure: DILATATION & CURETTAGE/HYSTEROSCOPY WITH MYOSURE;  Surgeon: Zelphia Cairo, MD;  Location: [facility] [place];  Service: Gynecology;  Laterality: N/A;   RIGHT MODIFIED NECK DISSECTION W/ REMOVAL BENIGN RIGHT PARAPHARYNGEAL SPACE MASS AND LYMPH NODES  08-02-2004   benign bronchogenic cysts / negative lymph nodes   Patient Active Problem List   Diagnosis Date Noted   Acute pain of left shoulder 12/07/2022   Varicose veins of left leg with edema 08/28/2022   Tinea corporis 08/07/2022   Preventative health care 01/02/2018   URI (upper respiratory infection) 02/19/2017   Neck strain 02/19/2017   High risk medication use 01/02/2017   Right ankle pain 08/05/2013   Cellulitis, toe 03/21/2012   UNSPECIFIED VITAMIN D DEFICIENCY 02/02/2009    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   LEG PAIN, LEFT 10/28/2008   ANXIETY 06/23/2008   UNSPECIFIED OSTEOPOROSIS 12/29/2007   HYPERLIPIDEMIA 10/02/2007   INJURY, 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 She pain is much improved - 75% - able to move through greater ROM and reach behind back easier.    PAIN: Are you having pain? 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 Yes. Number of falls 1 - at time of injury  LIVING ENVIRONMENT: Lives with: lives with their family and lives with their spouse - husband and two kids Lives in: House/apartment Stairs: Yes: Internal: 14 steps; on right going up Has following equipment at home: None  OCCUPATION: FT - Automotive engineer at [employer]  PLOF: Independent and Leisure: time with family, pilates 3x/wk   PATIENT GOALS: "To get full function back w/o pain and not have to do surgery."   OBJECTIVE: (objective measures completed at  initial evaluation unless otherwise dated)  DIAGNOSTIC FINDINGS:  N/A  PATIENT SURVEYS:  Quick Dash 15.9 / 100 = 15.9 % ; 04/29/23: 4.5 / 100 = 4.5 %  COGNITION: Overall cognitive status: Within functional limits for tasks assessed     SENSATION: WFL  POSTURE: rounded shoulders L>R  UPPER EXTREMITY ROM:   Active ROM Right eval Left eval Left 03/26/23 Left 04/15/23   Shoulder flexion 150 140 * 150 151  Shoulder extension 48 48    Shoulder abduction 171 148 * 165- mild pain at end range 171- mild pain at end  Shoulder adduction      Shoulder internal rotation FIR WNL FIR WNL * FIR to T6 FIR T6  Shoulder external rotation FER WNL FER WNL * FER  to T2 FER T3  Elbow flexion      Elbow extension      Wrist flexion      Wrist extension      Wrist ulnar deviation      Wrist radial deviation      Wrist pronation      Wrist supination      (Blank rows = not tested, * = pain)  UPPER EXTREMITY MMT:  MMT Right eval Left eval Left 03/26/23 Left 04/15/23 Left 04/29/23  Shoulder flexion 5 4+ * 5 5 5   Shoulder extension 5 4+   5  Shoulder abduction 5 4+ * 4+- mild pain 4+- mild pain 5  Shoulder adduction     5  Shoulder internal rotation 5 4+ * 5 5 5   Shoulder external rotation 5 4 4+ 5 5  Middle trapezius       Lower trapezius       Elbow flexion       Elbow extension       Wrist flexion       Wrist extension       Wrist ulnar deviation       Wrist radial deviation       Wrist pronation       Wrist supination       Grip strength (lbs)       (Blank rows = not tested, * = pain)  SHOULDER SPECIAL TESTS: Impingement tests: Neer impingement test: positive , Hawkins/Kennedy impingement test: positive , and Painful arc test: negative Rotator cuff assessment: Empty can test: positive  and Full can test: positive   JOINT MOBILITY TESTING:  WNL  PALPATION:  Increased muscle tension and TTP over L anterior deltoid & pecs    TODAY'S TREATMENT:   04/29/23 THERAPEUTIC  EXERCISE: to improve flexibility, strength and mobility.  Verbal and tactile cues throughout for technique.  UBE: L2.0 x 6 min (3' each fwd & back) HEP review  Standing blue TB row + scap retraction/depression 10 x 5" - cues to avoid excessive extension and upward rotation of elbows Standing blue TB retraction + shoulder extension to neutral 10 x 5"  THERAPEUTIC ACTIVITIES: Shoulder ROM & MMT QuickDASH: 4.5 / 100 = 4.5 %  MANUAL THERAPY: To promote normalized muscle tension, improved flexibility, and reduced pain. Skilled palpation and monitoring of soft tissue during DN Trigger Point Dry-Needling  Treatment instructions: Expect mild to moderate muscle soreness. Patient verbalized understanding of these instructions and education. Patient Consent Given: Yes Education handout provided: Previously provided Muscles treated: L anterior deltoid Electrical stimulation performed: No Parameters: N/A Treatment response/outcome: Twitch Response Elicited and Palpable Increase in Muscle Length STM/DTM, manual TPR and pin & stretch to muscles addressed with DN   04/15/23 THERAPEUTIC EXERCISE: to improve flexibility, strength and mobility.  Verbal and tactile cues throughout for technique.  UBE L1.0 3 min fwd/back TRX rows x 10 with scap retraction Lat pull down 25# 2x10 Prone horizontal ABD 2x10 2# B  MANUAL THERAPY: To promote normalized muscle tension, improved flexibility, improved joint mobility, increased ROM, and reduced pain. STM to L anterior deltoid, biceps  Therapeutic Activity: MMT and ROM assessed   03/26/23 THERAPEUTIC EXERCISE: to improve flexibility, strength and mobility.  Verbal and tactile cues throughout for technique.  Pulleys 3 min flexion and scaption  UE MMT and AROM measured QuickDASH Score: 15.9 / 100 = 15.9 % S/L L shoulder abduction x 10 - cues to avoid pain S/L L shoulder horiz ABD x 10  S/L L shoulder ER x 10    PATIENT EDUCATION:  Education details: HEP  review, recommended frequency for ongoing HEP at discharge to prevent loss of gains achieved with PT, role of DN, and DN rational, procedure, outcomes, potential side effects, and recommended post-treatment exercises/activity  Person educated: Patient Education method: Explanation Education comprehension: verbalized understanding  HOME EXERCISE PROGRAM: *Access Code: [email or web address] URL: [email or web address] Date: 04/29/2023 Prepared by: Glenetta Hew  Exercises - Doorway Pec Stretch at 60 Degrees Abduction with Arm Straight (Mirrored)  - 2-3 x daily - 7 x weekly - 3 reps - 30 sec hold - Single Arm Doorway Pec Stretch at 90 Degrees Abduction (Mirrored)  - 2-3 x daily - 7 x weekly - 3 reps - 30 sec hold - Single Arm Doorway Pec Stretch at 120 Degrees Abduction (Mirrored)  - 2-3 x daily - 7 x weekly - 3 reps - 30 sec hold - [place] on [employer]  - 2 x daily - 7 x weekly - 5 reps - 15-20 sec hold - Standing Bilateral Low Shoulder Row with Anchored Resistance  - 1 x daily - 3 x weekly - 2 sets - 10 reps - 5 sec hold - Scapular Retraction with Resistance Advanced  - 1 x daily - 3 x weekly - 2 sets - 10 reps - 5 sec hold - Standing Lat Pull Down with Resistance - Elbows Bent  - 1 x daily - 3 x weekly - 2 sets - 10 reps - 3 sec hold - Supine Shoulder External Rotation on Foam Roll with Theraband  - 1 x daily - 3 x weekly - 2 sets -  10 reps - 3 sec hold - Supine Shoulder Horizontal Abduction with Resistance  - 1 x daily - 3 x weekly - 2 sets - 10 reps - 3 sec hold - Supine PNF D2 Flexion with Resistance  - 1 x daily - 3 x weekly - 2 sets - 10 reps - 3 sec hold  Patient Education - Trigger Point Dry Needling  *Pt using MedBridgeGO app   ASSESSMENT:  CLINICAL IMPRESSION: Charny reports 75% improvement in pain and functional ROM. B shoulder ROM now essentially symmetrical and WNL with overall MMT 5/5. QuickDASH has improved to only 4.5% disability. She notes better ability to reach  behind her back along with increased ease of dressing upper body. She still notes a taut band in her anterior deltoid that we addressed with MT incorporating DN today resulting in good twitch response with palpable reduction in muscle tension. All PT goals now met and Baran feels ready to transition to her HEP but would like to remain on hold for 30-days in the event that issues arise that would necessitate a return to PT.   OBJECTIVE IMPAIRMENTS: decreased activity tolerance, decreased knowledge of condition, decreased ROM, decreased strength, increased fascial restrictions, impaired perceived functional ability, increased muscle spasms, impaired flexibility, impaired UE functional use, improper body mechanics, postural dysfunction, and pain.   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   REHAB POTENTIAL: Excellent  CLINICAL DECISION MAKING: Stable/uncomplicated  EVALUATION COMPLEXITY: Low   GOALS: Goals reviewed with patient? Yes  SHORT TERM GOALS: Target date: 03/04/2023   Patient will be independent with initial HEP to improve outcomes and carryover.  Baseline: Initial HEP provided on eval Goal status: MET  03/04/23  LONG TERM GOALS: Target date: 03/25/2023 extended to 04/30/23   Patient will be independent with ongoing/advanced HEP for self-management at home.  Baseline:  Goal status: MET  04/29/23   2.  Patient will report >/= 75% improvement in L shoulder pain to improve QOL.  Baseline: 2/10 at rest, 4-5/10 with triggering activities Goal status: MET  04/29/23 - 75%    3.  Patient to  demonstrate improved upright posture with posterior shoulder girdle engaged to promote improved glenohumeral joint mobility. Baseline: Mildly/rounded L shoulder Goal status: MET  04/29/23  4.  Patient to improve L shoulder AROM to WNL without pain provocation to allow for increased ease of ADLs.  Baseline: Mild limited ROM with pain at end range of motion Goal status: MET  04/29/23 - B shoulder ROM symmetrical and WNL   5.  Patient will demonstrate improved L shoulder strength to 5/5 for functional UE use. Baseline: Refer to above MMT table Goal status: MET  04/29/23   6  Patient will report </= 6% on QuickDASH to demonstrate improved functional ability.  Baseline: 15.9 / 100 = 15.9 % Goal status: MET  04/29/23 - QuickDASH: 4.5 / 100 = 4.5 %  7.  Patient will report ability to reach behind her back and don/shirt without increased pain. Baseline: Pain with these activities Goal status: MET  04/29/23 - pt better able to reach behind her back allowing for increased ease of dressing, donning a coat or fastening her seatbelt   PLAN:  PT FREQUENCY: 1-2x/week  PT DURATION: 6 weeks  PLANNED INTERVENTIONS: Therapeutic exercises, Therapeutic activity, Neuromuscular re-education, Patient/Family education, Self Care, Joint mobilization, Dry Needling, Electrical stimulation, Cryotherapy, Moist heat, Taping, Vasopneumatic device, Ultrasound, Manual therapy, and Re-evaluation  PLAN FOR NEXT SESSION: transition to HEP + 30-day hold  Marry Guan, PT 04/29/2023, 4:15 PM  PHYSICAL THERAPY DISCHARGE SUMMARY  Visits from Start of Care: 7  Current functional level related to goals / functional outcomes: From note above "Yamile reports 75% improvement in pain and functional ROM. B shoulder ROM now essentially symmetrical and WNL with overall MMT 5/5. QuickDASH has improved to only 4.5% disability. She notes better ability to reach behind her back along with increased ease of dressing upper body"   Remaining  deficits: Taught band anterior deltiod   Education / Equipment: HEP  Plan: Patient agrees to discharge.  Patient is being discharged due to meeting the stated rehab goals.  Refer to above clinical impression and goal assessment for status as of last visit on 04/29/2023. Patient was placed on hold for 30 days and has not needed to return to PT, therefore will proceed with discharge from PT for this episode.     Jena Gauss, PT  07/04/2023 2:40 PM

## 2023-04-29 NOTE — Progress Notes (Signed)
ANA remains positive--Titer is unchaged-1:320. Nonspecific titer.

## 2023-05-02 DIAGNOSIS — F4320 Adjustment disorder, unspecified: Secondary | ICD-10-CM | POA: Diagnosis not present

## 2023-05-13 ENCOUNTER — Other Ambulatory Visit (HOSPITAL_BASED_OUTPATIENT_CLINIC_OR_DEPARTMENT_OTHER): Payer: Self-pay

## 2023-05-13 DIAGNOSIS — N950 Postmenopausal bleeding: Secondary | ICD-10-CM | POA: Diagnosis not present

## 2023-05-13 DIAGNOSIS — N760 Acute vaginitis: Secondary | ICD-10-CM | POA: Diagnosis not present

## 2023-05-14 ENCOUNTER — Other Ambulatory Visit (HOSPITAL_BASED_OUTPATIENT_CLINIC_OR_DEPARTMENT_OTHER): Payer: Self-pay

## 2023-05-14 MED ORDER — METRONIDAZOLE 0.75 % VA GEL
1.0000 | Freq: Every day | VAGINAL | 0 refills | Status: DC
  Filled 2023-05-14: qty 70, 30d supply, fill #0

## 2023-05-16 ENCOUNTER — Telehealth: Payer: 59 | Admitting: Physician Assistant

## 2023-05-16 DIAGNOSIS — B0089 Other herpesviral infection: Secondary | ICD-10-CM

## 2023-05-16 DIAGNOSIS — B001 Herpesviral vesicular dermatitis: Secondary | ICD-10-CM | POA: Diagnosis not present

## 2023-05-16 MED ORDER — VALACYCLOVIR HCL 1 G PO TABS
1000.0000 mg | ORAL_TABLET | Freq: Two times a day (BID) | ORAL | 0 refills | Status: AC
Start: 2023-05-16 — End: 2023-05-23

## 2023-05-16 NOTE — Progress Notes (Signed)
We are sorry that you are not feeling well.  Here is how we plan to help!  Based on what you have shared with me it does look like you have a viral infection.    Most cold sores or fever blisters are small fluid filled blisters around the mouth caused by herpes simplex virus.  The most common strain of the virus causing cold sores is herpes simplex virus 1.  It can be spread by skin contact, sharing eating utensils, or even sharing towels.  Cold sores are contagious to other people until dry. (Approximately 5-7 days).  Wash your hands. You can spread the virus to your eyes through handling your contact lenses after touching the lesions.  Most people experience pain at the sight or tingling sensations in their lips that may begin before the ulcers erupt.  Herpes simplex is treatable but not curable.  It may lie dormant for a long time and then reappear due to stress or prolonged sun exposure.  Many patients have success in treating their cold sores with an over the counter topical called Abreva.  You may apply the cream up to 5 times daily (maximum 10 days) until healing occurs.  If you would like to use an oral antiviral medication to speed the healing of your cold sore and herpetic whitlow (lesion on thumb), I have sent a prescription to your local pharmacy Valacyclovir 1 gm take one by mouth twice a day for 7 days    HOME CARE:  Wash your hands frequently. Do not pick at or rub the sore. Don't open the blisters. Avoid kissing other people during this time. Avoid sharing drinking glasses, eating utensils, or razors. Do not handle contact lenses unless you have thoroughly washed your hands with soap and warm water! Avoid oral sex during this time.  Herpes from sores on your mouth can spread to your partner's genital area. Avoid contact with anyone who has eczema or a weakened immune system. Cold sores are often triggered by exposure to intense sunlight, use a lip balm containing a sunscreen  (SPF 30 or higher).  GET HELP RIGHT AWAY IF:  Blisters look infected. Blisters occur near or in the eye. Symptoms last longer than 10 days. Your symptoms become worse.  MAKE SURE YOU:  Understand these instructions. Will watch your condition. Will get help right away if you are not doing well or get worse.    Your e-visit answers were reviewed by a board certified advanced clinical practitioner to complete your personal care plan.  Depending upon the condition, your plan could have  Included both over the counter or prescription medications.    Please review your pharmacy choice.  Be sure that the pharmacy you have chosen is open so that you can pick up your prescription now.  If there is a problem you can message your provider in MyChart to have the prescription routed to another pharmacy.    Your safety is important to [place].  If you have drug allergies check our prescription carefully.  For the next 24 hours you can use MyChart to ask questions about today's visit, request a non-urgent call back, or ask for a work or school excuse from your e-visit provider.  You will get an email in the next two days asking about your experience.  I hope that your e-visit has been valuable and will speed your recovery.  I have spent 5 minutes in review of e-visit questionnaire, review and updating patient chart, medical decision  making and response to patient.   Margaretann Loveless, PA-C

## 2023-05-26 ENCOUNTER — Other Ambulatory Visit: Payer: Self-pay

## 2023-05-26 ENCOUNTER — Ambulatory Visit
Admission: EM | Admit: 2023-05-26 | Discharge: 2023-05-26 | Disposition: A | Discharge status: Home / Self Care | Payer: 59 | Attending: Family Medicine | Admitting: Family Medicine

## 2023-05-26 ENCOUNTER — Ambulatory Visit: Payer: Self-pay

## 2023-05-26 DIAGNOSIS — L03011 Cellulitis of right finger: Secondary | ICD-10-CM | POA: Diagnosis not present

## 2023-05-26 MED ORDER — DOXYCYCLINE HYCLATE 100 MG PO CAPS: 100.0000 mg | ORAL_CAPSULE | Freq: Two times a day (BID) | ORAL | 0 refills | Status: AC

## 2023-05-26 NOTE — ED Triage Notes (Signed)
[facility] presents to [facility] with wound to right thumb for one week. Pt reports it looked like a pimple and it started to pop and drain last week. Pt reports this started to spread down her thumb and she also started to have a fever blister on her lip about this same time. Pt did apply Abreva to the thumb wound and did a virtual visit and was rx acyclovir but she did not start it as she was out of town and was feeling better. But now wound is worsening and draining

## 2023-05-26 NOTE — ED Provider Notes (Signed)
[facility]    CSN: [id number] Arrival date & time: 05/26/23  1025      History   Chief Complaint Chief Complaint  Patient presents with   Wound Check    HPI Sherry Jacobs is a 57 y.o. female.   HPI 57 year old female presents with wound of right thumb for 1 week.  Patient reports noticing pimple-like wound on dorsum of right thumb on 05/14/2023.  Reports covering area with bandage which made area worse and redness.  Only, patient reports using Abreva for cold sores on the lip on this wound as well thinking that it was possibly a cold sore of her thumb.  PMH significant for IBS and social anxiety disorder.  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   Tinea corporis 08/07/2022   Preventative health care 01/02/2018   URI (upper respiratory infection) 02/19/2017   Neck strain 02/19/2017   High risk medication use 01/02/2017   Right ankle pain 08/05/2013   Cellulitis, toe 03/21/2012   UNSPECIFIED VITAMIN D DEFICIENCY 02/02/2009   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   LEG PAIN, LEFT 10/28/2008   ANXIETY 06/23/2008   UNSPECIFIED OSTEOPOROSIS 12/29/2007   HYPERLIPIDEMIA 10/02/2007   INJURY, 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 09/09/2007   Palpitations 09/09/2007   ANA POSITIVE, HX OF 09/09/2007   IRRITABLE BOWEL SYNDROME 04/11/2007   FIBROMYALGIA 04/11/2007    Past Surgical History:  Procedure Laterality Date   APPENDECTOMY  1981   DILATATION & CURETTAGE/HYSTEROSCOPY WITH MYOSURE N/A 10/28/2014   Procedure: DILATATION &  CURETTAGE/HYSTEROSCOPY WITH MYOSURE;  Surgeon: Zelphia Cairo, MD;  Location: [facility];  Service: Gynecology;  Laterality: N/A;   RIGHT MODIFIED NECK DISSECTION W/ REMOVAL BENIGN RIGHT PARAPHARYNGEAL SPACE MASS AND LYMPH NODES  08-02-2004   benign bronchogenic cysts / negative lymph nodes    OB History   No obstetric history on file.      Home Medications    Prior to Admission medications   Medication Sig Start Date End Date Taking? Authorizing Provider  doxycycline (VIBRAMYCIN) 100 MG capsule Take 1 capsule (100 mg total) by mouth 2 (two) times daily for 10 days. 05/26/23 06/05/23 Yes Trevor Iha, FNP  ALPRAZolam Prudy Feeler) 0.25 MG tablet Take 0.25 mg by mouth as needed for anxiety.     [provider]  COVID-19 mRNA vaccine [phone] (COMIRNATY) syringe Inject into the muscle. Patient not taking: Reported on 04/24/2023 12/14/22   Judyann Munson, MD  fluticasone (CUTIVATE) 0.005 % ointment Apply 1 Application topically 2 (two) times daily NOT FOR FACE OR BODY FOLDS) Twice a day 14 days Patient taking differently: Apply 1 Application topically as needed. 03/20/23     metroNIDAZOLE (METROGEL) 0.75 % vaginal gel Insert 1 applicatorful every day by vaginal route at bedtime for 5 days. 05/14/23     Multiple Vitamin (MULTIVITAMIN) tablet Take 1 tablet by mouth daily.    [provider]  propranolol (INDERAL) 20 MG tablet Take  20 mg by mouth as needed.    [provider]  rosuvastatin (CRESTOR) 10 MG tablet Take 1 tablet (10 mg total) by mouth daily. 04/23/23   Donato Schultz, DO    Family History Family History  Problem Relation Age of Onset   Breast cancer Mother    Colon polyps Father    Melanoma Maternal Grandfather    Breast cancer Paternal Grandmother    Asthma Daughter        exercise induced    Asthma Son        exercise induced    Diabetes Other    Hypertension Other    Alcohol abuse Other    Hyperlipidemia Other    Arthritis  Other    Stroke Other    Thyroid disease Other    Uterine cancer Other    Colon cancer Neg Hx    Esophageal cancer Neg Hx    Stomach cancer Neg Hx    Rectal cancer Neg Hx     Social History Social History   Tobacco Use   Smoking status: Never    Passive exposure: Never   Smokeless tobacco: Never  Vaping Use   Vaping Use: Never used  Substance Use Topics   Alcohol use: Yes    Comment: social    Drug use: Never     Allergies   Sulfa antibiotics   Review of Systems Review of Systems  Skin:  Positive for rash and wound.     Physical Exam Triage Vital Signs ED Triage Vitals  Enc Vitals Group     BP 05/26/23 1048 121/83     Pulse Rate 05/26/23 1048 87     Resp 05/26/23 1048 16     Temp 05/26/23 1048 98.2 F (36.8 C)     Temp src --      SpO2 05/26/23 1048 98 %     Weight --      Height --      Head Circumference --      Peak Flow --      Pain Score 05/26/23 1046 5     Pain Loc --      Pain Edu? --      Excl. in GC? --    No data found.  Updated Vital Signs BP 121/83   Pulse 87   Temp 98.2 F (36.8 C)   Resp 16   LMP 04/07/2017   SpO2 98%      Physical Exam Vitals and nursing note reviewed.  Constitutional:      Appearance: Normal appearance. She is normal weight.  HENT:     Head: Normocephalic and atraumatic.     Mouth/Throat:     Mouth: Mucous membranes are moist.     Pharynx: Oropharynx is clear.  Eyes:     Extraocular Movements: Extraocular movements intact.     Conjunctiva/sclera: Conjunctivae normal.     Pupils: Pupils are equal, round, and reactive to light.  Cardiovascular:     Rate and Rhythm: Normal rate and regular rhythm.     Pulses: Normal pulses.     Heart sounds: Normal heart sounds.  Pulmonary:     Effort: Pulmonary effort is normal.     Breath sounds: Normal breath sounds. No wheezing, rhonchi or rales.  Musculoskeletal:        General: Normal range of motion.     Cervical back: Normal range of motion and neck  supple.  Skin:    General: Skin  is warm and dry.     Comments: Right thumb (dorsum): Erythematous mildly indurated area-please see image below  Neurological:     General: No focal deficit present.     Mental Status: She is alert and oriented to person, place, and time. Mental status is at baseline.  Psychiatric:        Mood and Affect: Mood normal.        Behavior: Behavior normal.      UC Treatments / Results  Labs (all labs ordered are listed, but only abnormal results are displayed) Labs Reviewed - No data to display  EKG   Radiology No results found.  Procedures Procedures (including critical care time)  Medications Ordered in UC Medications - No data to display  Initial Impression / Assessment and Plan / UC Course  I have reviewed the triage vital signs and the nursing notes.  Pertinent labs & imaging results that were available during my care of the patient were reviewed by me and considered in my medical decision making (see chart for details).     MDM: 1.  Cellulitis of right arm-Rx'd Doxycycline 100 mg capsule twice daily x 10 days. Advised patient to leave the area open to air to allow skin to heal by secondary intention (form scab).  Advised patient to take medication as directed with food to completion.  Encouraged increase daily water intake to 64 ounces per day while taking this medication.  Advised if symptoms worsen and/or unresolved please follow-up with PCP or here for further evaluation.  Patient discharged home, hemodynamically stable. Final Clinical Impressions(s) / UC Diagnoses   Final diagnoses:  Cellulitis of right thumb     Discharge Instructions      Advised patient to leave the area open to air to allow skin to heal by secondary intention (form scab).  Advised patient to take medication as directed with food to completion.  Encouraged increase daily water intake to 64 ounces per day while taking this medication.  Advised if symptoms worsen  and/or unresolved please follow-up with PCP or here for further evaluation.     ED Prescriptions     Medication Sig Dispense Auth. Provider   doxycycline (VIBRAMYCIN) 100 MG capsule Take 1 capsule (100 mg total) by mouth 2 (two) times daily for 10 days. 20 capsule Trevor Iha, FNP      PDMP not reviewed this encounter.   Trevor Iha, FNP 05/26/23 1142

## 2023-05-26 NOTE — Discharge Instructions (Addendum)
Advised patient to leave the area open to air to allow skin to heal by secondary intention (form scab).  Advised patient to take medication as directed with food to completion.  Encouraged increase daily water intake to 64 ounces per day while taking this medication.  Advised if symptoms worsen and/or unresolved please follow-up with PCP or here for further evaluation.

## 2023-05-29 DIAGNOSIS — F4320 Adjustment disorder, unspecified: Secondary | ICD-10-CM | POA: Diagnosis not present

## 2023-06-05 DIAGNOSIS — N950 Postmenopausal bleeding: Secondary | ICD-10-CM | POA: Diagnosis not present

## 2023-06-12 DIAGNOSIS — F4320 Adjustment disorder, unspecified: Secondary | ICD-10-CM | POA: Diagnosis not present

## 2023-06-18 ENCOUNTER — Other Ambulatory Visit (HOSPITAL_BASED_OUTPATIENT_CLINIC_OR_DEPARTMENT_OTHER): Payer: Self-pay

## 2023-06-18 DIAGNOSIS — F411 Generalized anxiety disorder: Secondary | ICD-10-CM | POA: Diagnosis not present

## 2023-06-18 MED ORDER — ALPRAZOLAM 0.25 MG PO TABS
0.2500 mg | ORAL_TABLET | Freq: Two times a day (BID) | ORAL | 0 refills | Status: DC
  Filled 2023-06-18 – 2023-06-27 (×2): qty 15, 8d supply, fill #0

## 2023-06-18 MED ORDER — PROPRANOLOL HCL 20 MG PO TABS
20.0000 mg | ORAL_TABLET | Freq: Three times a day (TID) | ORAL | 3 refills | Status: DC
  Filled 2023-06-18 – 2023-06-27 (×2): qty 15, 5d supply, fill #0

## 2023-06-26 DIAGNOSIS — F4320 Adjustment disorder, unspecified: Secondary | ICD-10-CM | POA: Diagnosis not present

## 2023-06-27 ENCOUNTER — Other Ambulatory Visit (HOSPITAL_BASED_OUTPATIENT_CLINIC_OR_DEPARTMENT_OTHER): Payer: Self-pay

## 2023-07-11 ENCOUNTER — Encounter (INDEPENDENT_AMBULATORY_CARE_PROVIDER_SITE_OTHER): Payer: Self-pay

## 2023-07-25 DIAGNOSIS — F4320 Adjustment disorder, unspecified: Secondary | ICD-10-CM | POA: Diagnosis not present

## 2023-08-12 ENCOUNTER — Encounter: Payer: 59 | Admitting: Family Medicine

## 2023-08-20 DIAGNOSIS — F4320 Adjustment disorder, unspecified: Secondary | ICD-10-CM | POA: Diagnosis not present

## 2023-08-29 DIAGNOSIS — F4320 Adjustment disorder, unspecified: Secondary | ICD-10-CM | POA: Diagnosis not present

## 2023-09-03 ENCOUNTER — Other Ambulatory Visit (HOSPITAL_BASED_OUTPATIENT_CLINIC_OR_DEPARTMENT_OTHER): Payer: Self-pay

## 2023-09-03 MED ORDER — INFLUENZA VIRUS VACC SPLIT PF (FLUZONE) 0.5 ML IM SUSY
0.5000 mL | PREFILLED_SYRINGE | Freq: Once | INTRAMUSCULAR | 0 refills | Status: AC
  Filled 2023-09-03: qty 0.5, 1d supply, fill #0

## 2023-09-20 ENCOUNTER — Other Ambulatory Visit (HOSPITAL_COMMUNITY): Payer: Self-pay | Admitting: Obstetrics and Gynecology

## 2023-09-20 DIAGNOSIS — Z803 Family history of malignant neoplasm of breast: Secondary | ICD-10-CM

## 2023-09-24 DIAGNOSIS — F4320 Adjustment disorder, unspecified: Secondary | ICD-10-CM | POA: Diagnosis not present

## 2023-09-26 ENCOUNTER — Encounter: Payer: 59 | Admitting: Family Medicine

## 2023-10-08 DIAGNOSIS — F4320 Adjustment disorder, unspecified: Secondary | ICD-10-CM | POA: Diagnosis not present

## 2023-10-22 NOTE — Progress Notes (Unsigned)
Office Visit Note  Patient: Sherry Jacobs             Date of Birth: [birth year]           MRN: [id number]             PCP: Donato Schultz, DO Referring: Donato Schultz, * Visit Date: 10/30/2023 Occupation: @GUAROCC @  Subjective:  No chief complaint on file.   History of Present Illness: Sherry Jacobs is a 57 y.o. female ***     Activities of Daily Living:  Patient reports morning stiffness for *** {minute/hour:19697}.   Patient {ACTIONS;DENIES/REPORTS:21021675::"Denies"} nocturnal pain.  Difficulty dressing/grooming: {ACTIONS;DENIES/REPORTS:21021675::"Denies"} Difficulty climbing stairs: {ACTIONS;DENIES/REPORTS:21021675::"Denies"} Difficulty getting out of chair: {ACTIONS;DENIES/REPORTS:21021675::"Denies"} Difficulty using hands for taps, buttons, cutlery, and/or writing: {ACTIONS;DENIES/REPORTS:21021675::"Denies"}  No Rheumatology ROS completed.   PMFS History:  Patient Active Problem List   Diagnosis Date Noted  . Acute pain of left shoulder 12/07/2022  . 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 Tinea corporis 08/07/2022  . Preventative health care 01/02/2018  . URI (upper respiratory infection) 02/19/2017  . Neck strain 02/19/2017  . High risk medication use 01/02/2017  . Right ankle pain 08/05/2013  . Cellulitis, toe 03/21/2012  . Vitamin D deficiency 02/02/2009  . 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 LEG PAIN, LEFT 10/28/2008  . Anxiety state 06/23/2008  . Osteoporosis 12/29/2007  . HYPERLIPIDEMIA 10/02/2007  . INJURY, 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 09/09/2007  . Palpitations 09/09/2007  . ANA POSITIVE, HX OF 09/09/2007  . IRRITABLE BOWEL SYNDROME 04/11/2007  . FIBROMYALGIA 04/11/2007    Past Medical History:  Diagnosis Date  . ANA positive   . Arthralgia   . Autoimmune disease (HCC)    ANA positive  . Endometrial polyp   . Hyperlipidemia   . IBS (irritable bowel syndrome)   . Iron deficiency  anemia   . Lactose intolerance in adult   . PONV (postoperative nausea and vomiting)   . Social anxiety disorder   . Wears contact lenses     Family History  Problem Relation Age of Onset  . Breast cancer Mother   . Colon polyps Father   . Melanoma Maternal Grandfather   . Breast cancer Paternal Grandmother   . Asthma Daughter        exercise induced   . Asthma Son        exercise induced   . Diabetes Other   . Hypertension Other   . Alcohol abuse Other   . Hyperlipidemia Other   . Arthritis Other   . Stroke Other   . Thyroid disease Other   . Uterine cancer Other   . Colon cancer Neg Hx   . Esophageal cancer Neg Hx   . Stomach cancer Neg Hx   . Rectal cancer Neg Hx    Past Surgical History:  Procedure Laterality Date  . APPENDECTOMY  1981  . DILATATION & CURETTAGE/HYSTEROSCOPY WITH MYOSURE N/A 10/28/2014   Procedure: DILATATION & CURETTAGE/HYSTEROSCOPY WITH MYOSURE;  Surgeon: Zelphia Cairo, MD;  Location: [facility] [place];  Service: Gynecology;  Laterality: N/A;  . 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 H1N1 10/13/2008  . Influenza Whole 11/26/2004, 09/15/2008, 08/30/2010  . Influenza, Seasonal, Injecte, Preservative Fre 09/03/2023  . Influenza-Unspecified 08/27/2011, 08/26/2016, 09/05/2019, 09/04/2020  . Moderna Covid-19 Vaccine Bivalent Booster 66yrs & up 11/22/2021  . PFIZER(Purple Top)SARS-COV-2 Vaccination 11/17/2019, 12/08/2019  . Pfizer(Comirnaty)Fall Seasonal Vaccine 12 years and older 12/14/2022  . Pneumococcal Polysaccharide-23 05/11/2011  . Td 12/15/2008  . Tdap 08/06/2022  . Unspecified SARS-COV-2 Vaccination  11/02/2020  . Varicella 08/07/2021  . Zoster Recombinant(Shingrix) 06/06/2021, 08/08/2021  . Zoster, Live 02/24/2022     Objective: Vital Signs: LMP 04/07/2017    Physical Exam   Musculoskeletal Exam: ***  CDAI Exam: CDAI Score: -- Patient Global: --; Provider Global: -- Swollen: --; Tender: -- Joint Exam 10/30/2023   No joint exam has been documented for this visit   There is currently no information documented on the homunculus. Go to the Rheumatology activity and complete the homunculus joint exam.  Investigation: No additional findings.  Imaging: No results found.  Recent Labs: Lab Results  Component Value Date   WBC 5.6 04/24/2023   HGB 13.7 04/24/2023   PLT 272 04/24/2023   NA 139 04/24/2023   K 4.3 04/24/2023   CL 102 04/24/2023   CO2 29 04/24/2023   GLUCOSE 89 04/24/2023   BUN 21 04/24/2023   CREATININE 0.86 04/24/2023   BILITOT 0.4 04/24/2023   ALKPHOS 69 09/21/2021   AST 22 04/24/2023   ALT 26 04/24/2023   PROT 7.0 04/24/2023   ALBUMIN 4.8 09/21/2021   CALCIUM 9.7 04/24/2023   GFRAA 88 01/10/2021    Speciality Comments: PLQ Eye Exam: 04/29/2023 WNL @ [employer].  Procedures:  No procedures performed Allergies: Sulfa antibiotics   Assessment / Plan:     Visit Diagnoses: No diagnosis found.  Orders: No orders of the defined types were placed in this encounter.  No orders of the defined types were placed in this encounter.   Face-to-face time spent with patient was *** minutes. Greater than 50% of time was spent in counseling and coordination of care.  Follow-Up Instructions: No follow-ups on file.   Ellen Henri, CMA  Note - This record has been created using Animal nutritionist.  Chart creation errors have been sought, but may not always  have been located. Such creation errors do not reflect on  the standard of medical care.

## 2023-10-28 ENCOUNTER — Ambulatory Visit (HOSPITAL_COMMUNITY): Payer: 59

## 2023-10-29 DIAGNOSIS — H524 Presbyopia: Secondary | ICD-10-CM | POA: Diagnosis not present

## 2023-10-29 DIAGNOSIS — H5203 Hypermetropia, bilateral: Secondary | ICD-10-CM | POA: Diagnosis not present

## 2023-10-30 ENCOUNTER — Ambulatory Visit: Payer: 59 | Admitting: Rheumatology

## 2023-10-30 DIAGNOSIS — M19041 Primary osteoarthritis, right hand: Secondary | ICD-10-CM

## 2023-10-30 DIAGNOSIS — M797 Fibromyalgia: Secondary | ICD-10-CM

## 2023-10-30 DIAGNOSIS — M7061 Trochanteric bursitis, right hip: Secondary | ICD-10-CM

## 2023-10-30 DIAGNOSIS — Z79899 Other long term (current) drug therapy: Secondary | ICD-10-CM

## 2023-10-30 DIAGNOSIS — Z8719 Personal history of other diseases of the digestive system: Secondary | ICD-10-CM

## 2023-10-30 DIAGNOSIS — I7300 Raynaud's syndrome without gangrene: Secondary | ICD-10-CM

## 2023-10-30 DIAGNOSIS — M359 Systemic involvement of connective tissue, unspecified: Secondary | ICD-10-CM

## 2023-10-30 DIAGNOSIS — E559 Vitamin D deficiency, unspecified: Secondary | ICD-10-CM

## 2023-10-30 DIAGNOSIS — R5383 Other fatigue: Secondary | ICD-10-CM

## 2023-10-30 DIAGNOSIS — Z8639 Personal history of other endocrine, nutritional and metabolic disease: Secondary | ICD-10-CM

## 2023-10-30 DIAGNOSIS — M3500 Sicca syndrome, unspecified: Secondary | ICD-10-CM

## 2023-10-30 DIAGNOSIS — Z8659 Personal history of other mental and behavioral disorders: Secondary | ICD-10-CM

## 2023-11-07 DIAGNOSIS — F4320 Adjustment disorder, unspecified: Secondary | ICD-10-CM | POA: Diagnosis not present

## 2023-11-12 ENCOUNTER — Ambulatory Visit (HOSPITAL_BASED_OUTPATIENT_CLINIC_OR_DEPARTMENT_OTHER)
Admission: RE | Admit: 2023-11-12 | Discharge: 2023-11-12 | Disposition: A | Discharge status: Home / Self Care | Payer: 59 | Source: Ambulatory Visit | Attending: Family Medicine | Admitting: Family Medicine

## 2023-11-12 ENCOUNTER — Encounter: Payer: Self-pay | Admitting: Family Medicine

## 2023-11-12 ENCOUNTER — Ambulatory Visit (INDEPENDENT_AMBULATORY_CARE_PROVIDER_SITE_OTHER): Payer: 59 | Admitting: Family Medicine

## 2023-11-12 ENCOUNTER — Encounter (HOSPITAL_BASED_OUTPATIENT_CLINIC_OR_DEPARTMENT_OTHER): Payer: Self-pay

## 2023-11-12 VITALS — BP 110/80 | HR 86 | Temp 98.0°F | Resp 16 | Ht 67.0 in | Wt 206.8 lb

## 2023-11-12 DIAGNOSIS — K7689 Other specified diseases of liver: Secondary | ICD-10-CM | POA: Diagnosis not present

## 2023-11-12 DIAGNOSIS — R1031 Right lower quadrant pain: Secondary | ICD-10-CM | POA: Diagnosis not present

## 2023-11-12 DIAGNOSIS — N200 Calculus of kidney: Secondary | ICD-10-CM | POA: Diagnosis not present

## 2023-11-12 LAB — POC URINALSYSI DIPSTICK (AUTOMATED)
Bilirubin, UA: NEGATIVE
Blood: NEGATIVE
Glucose, UA: NEGATIVE
Ketones, UA: NEGATIVE
Leukocytes, UA: NEGATIVE
Nitrite, UA: NEGATIVE
Protein, UA: NEGATIVE
Spec Grav, UA: <=1.005 — AB (ref 1.010–1.025)
Urobilinogen, UA: 0.2 U/dL
pH, UA: 7.5 (ref 5.0–8.0)

## 2023-11-12 LAB — CBC WITH DIFFERENTIAL/PLATELET
Basophils Absolute: 0 10*3/uL (ref 0.0–0.1)
Basophils Relative: 0.6 % (ref 0.0–3.0)
Eosinophils Absolute: 0.1 10*3/uL (ref 0.0–0.7)
Eosinophils Relative: 1.7 % (ref 0.0–5.0)
HCT: 42 % (ref 36.0–46.0)
Hemoglobin: 13.8 g/dL (ref 12.0–15.0)
Lymphocytes Relative: 26 % (ref 12.0–46.0)
Lymphs Abs: 1.4 10*3/uL (ref 0.7–4.0)
MCHC: 32.8 g/dL (ref 30.0–36.0)
MCV: 86.1 fL (ref 78.0–100.0)
Monocytes Absolute: 0.5 10*3/uL (ref 0.1–1.0)
Monocytes Relative: 9.3 % (ref 3.0–12.0)
Neutro Abs: 3.3 10*3/uL (ref 1.4–7.7)
Neutrophils Relative %: 62.4 % (ref 43.0–77.0)
Platelets: 265 10*3/uL (ref 150.0–400.0)
RBC: 4.88 Mil/uL (ref 3.87–5.11)
RDW: 13.8 % (ref 11.5–15.5)
WBC: 5.2 10*3/uL (ref 4.0–10.5)

## 2023-11-12 LAB — COMPREHENSIVE METABOLIC PANEL
ALT: 25 U/L (ref 0–35)
AST: 23 U/L (ref 0–37)
Albumin: 4.6 g/dL (ref 3.5–5.2)
Alkaline Phosphatase: 74 U/L (ref 39–117)
BUN: 20 mg/dL (ref 6–23)
CO2: 28 meq/L (ref 19–32)
Calcium: 10 mg/dL (ref 8.4–10.5)
Chloride: 101 meq/L (ref 96–112)
Creatinine, Ser: 0.88 mg/dL (ref 0.40–1.20)
GFR: 72.26 mL/min (ref >60.00)
Glucose, Bld: 98 mg/dL (ref 70–99)
Potassium: 4.1 meq/L (ref 3.5–5.1)
Sodium: 137 meq/L (ref 135–145)
Total Bilirubin: 0.5 mg/dL (ref 0.2–1.2)
Total Protein: 7.7 g/dL (ref 6.0–8.3)

## 2023-11-12 MED ORDER — IOHEXOL 300 MG/ML  SOLN
100.0000 mL | Freq: Once | INTRAMUSCULAR | Status: AC | PRN
  Administered 2023-11-12: 100 mL via INTRAVENOUS

## 2023-11-12 NOTE — Progress Notes (Signed)
Established Patient Office Visit  Subjective   Patient ID: Sherry Jacobs, female    DOB: 08-13-1966  Age: 57 y.o. MRN: [id number]  Chief Complaint  Patient presents with   Abdominal Pain    X1 week, right lower abd pain, bloating, no gas.     HPI Discussed the use of AI scribe software for clinical note transcription with the patient, who gave verbal consent to proceed.  History of Present Illness   The patient, with a history of previous right lower quadrant pain, presents with a recurrence of the same symptom that started about a week ago. The pain is localized to the right lower quadrant and is exacerbated by pressure. The patient denies any associated symptoms such as changes in bowel habits, blood in stool, or urinary symptoms. The pain does not interfere with walking but is more noticeable when lying down and during exhalation. The patient also reports a recent history of bloating which has since improved.  The patient recalls a similar episode approximately four years ago, which was investigated with a CT scan. The scan revealed kidney stones but was otherwise unremarkable. The pain eventually resolved without treatment. The patient also has a history of multiple ultrasounds due to post-menopausal bleeding, all of which were normal. The most recent health issue was an episode of bacterial vaginosis in June, which was treated with Flagyl. The patient denies any current symptoms related to this condition.      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)  12/15/2008   LEG PAIN, LEFT 10/28/2008   Anxiety state 06/23/2008   Osteoporosis 12/29/2007   HYPERLIPIDEMIA 10/02/2007   INJURY, 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 09/09/2007   Palpitations 09/09/2007   ANA POSITIVE, HX OF 09/09/2007   IRRITABLE BOWEL SYNDROME 04/11/2007   FIBROMYALGIA 04/11/2007   Past Medical History:  Diagnosis Date   ANA positive    Arthralgia    Autoimmune disease (HCC)    ANA positive   Endometrial polyp    Hyperlipidemia    IBS (irritable bowel syndrome)    Iron deficiency anemia    Lactose intolerance in adult    PONV (postoperative nausea and vomiting)    Social anxiety disorder    Wears contact lenses    Past Surgical History:  Procedure Laterality Date   APPENDECTOMY  1981   DILATATION & CURETTAGE/HYSTEROSCOPY WITH MYOSURE N/A 10/28/2014   Procedure: DILATATION & CURETTAGE/HYSTEROSCOPY WITH MYOSURE;  Surgeon: Zelphia Cairo, MD;  Location: [facility] [place];  Service: Gynecology;  Laterality: N/A;   RIGHT MODIFIED NECK DISSECTION W/ REMOVAL BENIGN RIGHT PARAPHARYNGEAL SPACE MASS AND LYMPH NODES  08-02-2004   benign bronchogenic cysts / negative lymph nodes   Social History   Tobacco Use   Smoking status: Never    Passive exposure: Never   Smokeless tobacco: Never  Vaping Use   Vaping status: Never Used  Substance Use Topics   Alcohol use: Yes    Comment: social    Drug use: Never   Social History   Socioeconomic History   Marital  status: Married    Spouse name: Not on file   Number of children: 2   Years of education: Not on file   Highest education level: Master's degree (e.g., MA, MS, MEng, MEd, MSW, MBA)  Occupational History   Occupation: nurse    Employer: [employer]  Tobacco Use   Smoking status: Never    Passive exposure: Never   Smokeless tobacco: Never  Vaping Use   Vaping status: Never Used  Substance and Sexual Activity   Alcohol use: Yes    Comment: social    Drug use: Never   Sexual activity: Not on file   Other Topics Concern   Not on file  Social History Narrative   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   Living at home with husband and two kids                        Social Drivers of Health   Financial Resource Strain: Low Risk  (11/11/2023)   Overall Financial Resource Strain (CARDIA)    Difficulty of Paying Living Expenses: Not hard at all  Food Insecurity: No Food Insecurity (11/11/2023)   Hunger Vital Sign    Worried About Running Out of Food in the Last Year: Never true    Ran Out of Food in the Last Year: Never true  Transportation Needs: No Transportation Needs (11/11/2023)   PRAPARE - Administrator, Civil Service (Medical): No    Lack of Transportation (Non-Medical): No  Physical Activity: Insufficiently Active (11/11/2023)   Exercise Vital Sign    Days of Exercise per Week: 2 days    Minutes of Exercise per Session: 20 min  Stress: No Stress Concern Present (11/11/2023)   [employer] of Occupational Health - Occupational Stress Questionnaire    Feeling of Stress : Not at all  Social Connections: Moderately Isolated (11/11/2023)   Social Connection and Isolation Panel [NHANES]    Frequency of Communication with Friends and Family: More than three times a week    Frequency of Social Gatherings with Friends and Family: Twice a week    Attends Religious Services: Never    Database administrator or Organizations: No    Attends Engineer, structural: Not on file    Marital Status: Married  Catering manager Violence: Not on file   Family Status  Relation Name Status   Mother  Alive   Father  Alive   Sister  Alive   Sister  Alive   Brother  Alive   MGF  (Not Specified)   PGM  (Not Specified)   Daughter  Alive   Son  Alive   Other  (Not Specified)   Neg Hx  (Not Specified)  No partnership data on file   Family History  Problem Relation Age of Onset   Breast cancer Mother    Colon polyps Father    Melanoma Maternal Grandfather     Breast cancer Paternal Grandmother    Asthma Daughter        exercise induced    Asthma Son        exercise induced    Diabetes Other    Hypertension Other    Alcohol abuse Other    Hyperlipidemia Other    Arthritis Other    Stroke Other    Thyroid disease Other    Uterine cancer Other    Colon cancer Neg Hx    Esophageal cancer Neg  Hx    Stomach cancer Neg Hx    Rectal cancer Neg Hx    Allergies  Allergen Reactions   Sulfa Antibiotics Nausea And Vomiting and Nausea Only      Review of Systems  Constitutional:  Negative for fever and malaise/fatigue.  HENT:  Negative for congestion.   Eyes:  Negative for blurred vision.  Respiratory:  Negative for cough and shortness of breath.   Cardiovascular:  Negative for chest pain, palpitations and leg swelling.  Gastrointestinal:  Positive for abdominal pain. Negative for blood in stool, nausea and vomiting.  Genitourinary:  Negative for dysuria and frequency.  Musculoskeletal:  Negative for back pain and falls.  Skin:  Negative for rash.  Neurological:  Negative for dizziness, loss of consciousness and headaches.  Endo/Heme/Allergies:  Negative for environmental allergies.  Psychiatric/Behavioral:  Negative for depression. The patient is not nervous/anxious.       Objective:     BP 110/80 (BP Location: Left Arm, Patient Position: Sitting, Cuff Size: Normal)   Pulse 86   Temp 98 F (36.7 C) (Oral)   Resp 16   Ht 5\' 7"  (1.702 m)   Wt 206 lb 12.8 oz (93.8 kg)   LMP 04/07/2017   SpO2 98%   BMI 32.39 kg/m  BP Readings from Last 3 Encounters:  11/12/23 110/80  05/26/23 121/83  04/24/23 113/76   Wt Readings from Last 3 Encounters:  11/12/23 206 lb 12.8 oz (93.8 kg)  04/24/23 204 lb (92.5 kg)  12/07/22 204 lb 9.6 oz (92.8 kg)   SpO2 Readings from Last 3 Encounters:  11/12/23 98%  05/26/23 98%  12/07/22 98%      Physical Exam Vitals and nursing note reviewed.  Constitutional:      General: She is not in  acute distress.    Appearance: Normal appearance. She is well-developed.  HENT:     Head: Normocephalic and atraumatic.  Eyes:     General: No scleral icterus.       Right eye: No discharge.        Left eye: No discharge.  Cardiovascular:     Rate and Rhythm: Normal rate and regular rhythm.     Heart sounds: No murmur heard. Pulmonary:     Effort: Pulmonary effort is normal. No respiratory distress.     Breath sounds: Normal breath sounds.  Abdominal:     General: Bowel sounds are normal.     Palpations: Abdomen is soft.     Tenderness: There is abdominal tenderness in the right lower quadrant.  Musculoskeletal:        General: Normal range of motion.     Cervical back: Normal range of motion and neck supple.     Right lower leg: No edema.     Left lower leg: No edema.  Skin:    General: Skin is warm and dry.  Neurological:     Mental Status: She is alert and oriented to person, place, and time.  Psychiatric:        Mood and Affect: Mood normal.        Behavior: Behavior normal.        Thought Content: Thought content normal.        Judgment: Judgment normal.      Results for orders placed or performed in visit on 11/12/23  CBC with Differential/Platelet  Result Value Ref Range   WBC 5.2 4.0 - 10.5 K/uL   RBC 4.88 3.87 - 5.11 Mil/uL   Hemoglobin  13.8 12.0 - 15.0 g/dL   HCT 81.1 91.4 - 78.2 %   MCV 86.1 78.0 - 100.0 fl   MCHC 32.8 30.0 - 36.0 g/dL   RDW 95.6 21.3 - 08.6 %   Platelets 265.0 150.0 - 400.0 K/uL   Neutrophils Relative % 62.4 43.0 - 77.0 %   Lymphocytes Relative 26.0 12.0 - 46.0 %   Monocytes Relative 9.3 3.0 - 12.0 %   Eosinophils Relative 1.7 0.0 - 5.0 %   Basophils Relative 0.6 0.0 - 3.0 %   Neutro Abs 3.3 1.4 - 7.7 K/uL   Lymphs Abs 1.4 0.7 - 4.0 K/uL   Monocytes Absolute 0.5 0.1 - 1.0 K/uL   Eosinophils Absolute 0.1 0.0 - 0.7 K/uL   Basophils Absolute 0.0 0.0 - 0.1 K/uL  Comprehensive metabolic panel  Result Value Ref Range   Sodium 137 135 -  145 mEq/L   Potassium 4.1 3.5 - 5.1 mEq/L   Chloride 101 96 - 112 mEq/L   CO2 28 19 - 32 mEq/L   Glucose, Bld 98 70 - 99 mg/dL   BUN 20 6 - 23 mg/dL   Creatinine, Ser 5.78 0.40 - 1.20 mg/dL   Total Bilirubin 0.5 0.2 - 1.2 mg/dL   Alkaline Phosphatase 74 39 - 117 U/L   AST 23 0 - 37 U/L   ALT 25 0 - 35 U/L   Total Protein 7.7 6.0 - 8.3 g/dL   Albumin 4.6 3.5 - 5.2 g/dL   GFR 46.96 >29.52 mL/min   Calcium 10.0 8.4 - 10.5 mg/dL  POCT Urinalysis Dipstick (Automated)  Result Value Ref Range   Color, UA yellow    Clarity, UA clear    Glucose, UA Negative Negative   Bilirubin, UA negative    Ketones, UA negative    Spec Grav, UA <=1.005 (A) 1.010 - 1.025   Blood negative    pH, UA 7.5 5.0 - 8.0   Protein, UA Negative Negative   Urobilinogen, UA 0.2 0.2 or 1.0 E.U./dL   Nitrite, UA negative    Leukocytes, UA Negative Negative    Last CBC Lab Results  Component Value Date   WBC 5.2 11/12/2023   HGB 13.8 11/12/2023   HCT 42.0 11/12/2023   MCV 86.1 11/12/2023   MCH 28.5 04/24/2023   RDW 13.8 11/12/2023   PLT 265.0 11/12/2023   Last metabolic panel Lab Results  Component Value Date   GLUCOSE 98 11/12/2023   NA 137 11/12/2023   K 4.1 11/12/2023   CL 101 11/12/2023   CO2 28 11/12/2023   BUN 20 11/12/2023   CREATININE 0.88 11/12/2023   GFR 72.26 11/12/2023   CALCIUM 10.0 11/12/2023   PROT 7.7 11/12/2023   ALBUMIN 4.6 11/12/2023   BILITOT 0.5 11/12/2023   ALKPHOS 74 11/12/2023   AST 23 11/12/2023   ALT 25 11/12/2023   ANIONGAP 9 08/04/2020   Last lipids Lab Results  Component Value Date   CHOL 165 09/21/2021   HDL 55.60 09/21/2021   LDLCALC 97 09/21/2021   LDLDIRECT 137.7 07/31/2013   TRIG 65.0 09/21/2021   CHOLHDL 3 09/21/2021   Last hemoglobin A1c No results found for: "HGBA1C" Last thyroid functions Lab Results  Component Value Date   TSH 4.85 06/05/2021   Last vitamin D Lab Results  Component Value Date   VD25OH 40 01/10/2021   Last vitamin B12  and Folate Lab Results  Component Value Date   VITAMINB12 303 03/07/2011   FOLATE 22.3 03/07/2011  The 10-year ASCVD risk score (Arnett DK, et al., 2019) is: 1.7%    Assessment & Plan:   Problem List Items Addressed This Visit   None Visit Diagnoses       Right lower quadrant pain    -  Primary   Relevant Orders   CBC with Differential/Platelet (Completed)   Comprehensive metabolic panel (Completed)   POCT Urinalysis Dipstick (Automated) (Completed)     Right lower quadrant abdominal pain       Relevant Orders   CT ABDOMEN PELVIS W CONTRAST     Assessment and Plan    Right Lower Quadrant Pain   She reports right lower quadrant pain persisting for about a week, localized and exacerbated by pressure and exhalation, without associated pain during walking, blood in stool, diarrhea, or constipation. She experienced similar pain four years ago, which resolved without treatment. Previous imaging revealed kidney stones. The differential diagnosis includes diverticulosis, ovarian cyst, or other gynecological issues. We discussed the need for a CT scan to rule out serious conditions such as ovarian cancer and explained that the CT scan is non-invasive with minimal risks and essential for accurate diagnosis. We will order a CT scan of the abdomen and pelvis, perform urinalysis, and send a referral to the appropriate specialist.  Bacterial Vaginosis   She was treated with Flagyl in June and currently reports no symptoms of discharge or odor. We will monitor for recurrence of symptoms.  Follow-up   We will follow up to review lab results and CT scan findings.         No follow-ups on file.    Donato Schultz, DO

## 2023-11-25 ENCOUNTER — Encounter (HOSPITAL_COMMUNITY): Payer: Self-pay

## 2023-11-25 ENCOUNTER — Ambulatory Visit (HOSPITAL_COMMUNITY): Payer: 59

## 2023-11-25 ENCOUNTER — Other Ambulatory Visit (HOSPITAL_BASED_OUTPATIENT_CLINIC_OR_DEPARTMENT_OTHER): Payer: Self-pay

## 2023-11-25 ENCOUNTER — Other Ambulatory Visit: Payer: Self-pay | Admitting: Family Medicine

## 2023-11-25 MED ORDER — ROSUVASTATIN CALCIUM 10 MG PO TABS
10.0000 mg | ORAL_TABLET | Freq: Every day | ORAL | 1 refills | Status: DC
  Filled 2023-11-25: qty 90, 90d supply, fill #0
  Filled 2024-08-20: qty 90, 90d supply, fill #1

## 2023-11-29 DIAGNOSIS — F4320 Adjustment disorder, unspecified: Secondary | ICD-10-CM | POA: Diagnosis not present

## 2023-12-04 NOTE — Progress Notes (Unsigned)
Office Visit Note  Patient: Sherry Jacobs             Date of Birth: [birth year]           MRN: [id number]             PCP: Donato Schultz, DO Referring: Donato Schultz, * Visit Date: 12/18/2023 Occupation: @GUAROCC @  Subjective:  Right thumb pain    History of Present Illness: Sherry Jacobs is a 58 y.o. female with history of autoimmune disease and osteoarthritis.  Patient is not currently taking any immunosuppressive agents.  She discontinued Plaquenil in May 2023.  Patient denies any signs or symptoms of autoimmune disease flare since discontinuing Plaquenil use.  She denies any symptoms of Raynaud's phenomenon.  She has not had any recent rashes or hair loss.  Patient reports that her energy level has been stable.  She continues to go to Pilates 3 days a week.  Patient has had some tenderness and prominence of the right first MCP joint.  Patient denies any injury to the right thumb but states that back in June 2024 she developed cellulitis and since then she has noticed increased prominence and soreness at times.  She denies any joint swelling.  She denies any other joint pain or inflammation at this time.  She has not had any morning stiffness or nocturnal pain.  Patient denies any swollen lymph nodes.  She has not had any recent or recurrent infections.  She has not been experiencing increased sicca symptoms.  She denies any oral or nasal ulcerations.  She denies any new medical conditions.   Activities of Daily Living:  Patient reports morning stiffness for 0  minutes.   Patient Denies nocturnal pain.  Difficulty dressing/grooming: Denies Difficulty climbing stairs: Denies Difficulty getting out of chair: Denies Difficulty using hands for taps, buttons, cutlery, and/or writing: Denies  Review of Systems  Constitutional:  Negative for activity change and fatigue.  HENT:  Negative for mouth sores and mouth dryness.   Eyes:  Negative for dryness.  Respiratory:   Negative for shortness of breath.   Cardiovascular:  Negative for chest pain and palpitations.  Gastrointestinal:  Negative for blood in stool, constipation and diarrhea.  Endocrine: Negative for increased urination.  Genitourinary:  Negative for involuntary urination.  Musculoskeletal:  Positive for joint pain and joint pain. Negative for gait problem, joint swelling, myalgias, muscle weakness, morning stiffness, muscle tenderness and myalgias.  Skin:  Negative for color change, rash, hair loss and sensitivity to sunlight.  Allergic/Immunologic: Negative.  Negative for susceptible to infections.  Neurological: Negative.  Negative for dizziness and headaches.  Hematological: Negative.  Negative for swollen glands.  Psychiatric/Behavioral: Negative.  Negative for depressed mood and sleep disturbance. The patient is not nervous/anxious.     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   LEG PAIN, LEFT 10/28/2008   Anxiety state 06/23/2008   Osteoporosis 12/29/2007   HYPERLIPIDEMIA 10/02/2007   INJURY, 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 09/09/2007   Palpitations  09/09/2007   ANA POSITIVE, HX OF 09/09/2007   IRRITABLE BOWEL SYNDROME 04/11/2007   FIBROMYALGIA 04/11/2007    Past Medical History:  Diagnosis Date   ANA positive    Arthralgia    Autoimmune disease (HCC)    ANA positive   Endometrial polyp    Hyperlipidemia    IBS (irritable bowel syndrome)    Iron deficiency anemia    Lactose intolerance in adult    PONV (postoperative nausea and  vomiting)    Social anxiety disorder    Wears contact lenses     Family History  Problem Relation Age of Onset   Breast cancer Mother    Colon polyps Father    Melanoma Maternal Grandfather    Breast cancer Paternal Grandmother    Asthma Daughter        exercise induced    Asthma Son        exercise induced    Diabetes Other    Hypertension Other    Alcohol abuse Other    Hyperlipidemia Other    Arthritis Other    Stroke Other    Thyroid disease Other    Uterine cancer Other    Colon cancer Neg Hx    Esophageal cancer Neg Hx    Stomach cancer Neg Hx    Rectal cancer Neg Hx    Past Surgical History:  Procedure Laterality Date   APPENDECTOMY  1981   DILATATION & CURETTAGE/HYSTEROSCOPY WITH MYOSURE N/A 10/28/2014   Procedure: DILATATION & CURETTAGE/HYSTEROSCOPY WITH MYOSURE;  Surgeon: Zelphia Cairo, MD;  Location: [facility];  Service: Gynecology;  Laterality: N/A;   RIGHT MODIFIED NECK DISSECTION W/ REMOVAL BENIGN RIGHT PARAPHARYNGEAL SPACE MASS AND LYMPH NODES  08-02-2004   benign bronchogenic cysts / negative lymph nodes   Social History   Social History Narrative   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   Living at home with husband and two kids                        Immunization History  Administered Date(s) Administered   H1N1 10/13/2008   Influenza Whole 11/26/2004, 09/15/2008, 08/30/2010   Influenza, Seasonal, Injecte, Preservative Fre 09/03/2023   Influenza-Unspecified 08/27/2011, 08/26/2016, 09/05/2019, 09/04/2020   Moderna Covid-19 Vaccine Bivalent Booster 87yrs & up 11/22/2021   PFIZER(Purple Top)SARS-COV-2 Vaccination 11/17/2019, 12/08/2019   Pfizer(Comirnaty)Fall Seasonal Vaccine 12 years and older 12/14/2022   Pneumococcal Polysaccharide-23 05/11/2011   Td 12/15/2008   Tdap 08/06/2022   Unspecified SARS-COV-2 Vaccination 11/02/2020   Varicella 08/07/2021   Zoster Recombinant(Shingrix) 06/06/2021, 08/08/2021   Zoster, Live  02/24/2022     Objective: Vital Signs: BP 111/73   Pulse 71   Resp 16   Ht 5\' 7"  (1.702 m)   Wt 204 lb (92.5 kg)   LMP 04/07/2017   BMI 31.95 kg/m    Physical Exam Vitals and nursing note reviewed.  Constitutional:      Appearance: She is well-developed.  HENT:     Head: Normocephalic and atraumatic.  Eyes:     Conjunctiva/sclera: Conjunctivae normal.  Cardiovascular:     Rate and Rhythm: Normal rate and regular rhythm.     Heart sounds: Normal heart sounds.  Pulmonary:     Effort: Pulmonary effort is normal.     Breath sounds: Normal breath sounds.  Abdominal:     General: Bowel sounds are normal.     Palpations: Abdomen is soft.  Musculoskeletal:  Cervical back: Normal range of motion.  Lymphadenopathy:     Cervical: No cervical adenopathy.  Skin:    General: Skin is warm and dry.     Capillary Refill: Capillary refill takes less than 2 seconds.  Neurological:     Mental Status: She is alert and oriented to person, place, and time.  Psychiatric:        Behavior: Behavior normal.      Musculoskeletal Exam: C-spine, thoracic spine, and lumbar spine good ORM.  Shoulder joints, elbow joints, wrist joints, MCPs, PIPs, and DIPs good ROM.  Complete fist formation bilaterally.  Hip joints have good ROM with no groin pain.  Knee joints have good ROM with no warmth or effusion.  Ankle joints have good ROM with no tenderness or joint swelling.    CDAI Exam: CDAI Score: -- Patient Global: --; Provider Global: -- Swollen: --; Tender: -- Joint Exam 12/18/2023   No joint exam has been documented for this visit   There is currently no information documented on the homunculus. Go to the Rheumatology activity and complete the homunculus joint exam.  Investigation: No additional findings.  Imaging: No results found.   Recent Labs: Lab Results  Component Value Date   WBC 5.2 11/12/2023   HGB 13.8 11/12/2023   PLT 265.0 11/12/2023   NA 137 11/12/2023   K 4.1  11/12/2023   CL 101 11/12/2023   CO2 28 11/12/2023   GLUCOSE 98 11/12/2023   BUN 20 11/12/2023   CREATININE 0.88 11/12/2023   BILITOT 0.5 11/12/2023   ALKPHOS 74 11/12/2023   AST 23 11/12/2023   ALT 25 11/12/2023   PROT 7.7 11/12/2023   ALBUMIN 4.6 11/12/2023   CALCIUM 10.0 11/12/2023   GFRAA 88 01/10/2021    Speciality Comments: PLQ Eye Exam: 04/29/2023 WNL @ [employer] [facility].  Procedures:  No procedures performed Allergies: Sulfa antibiotics   Assessment / Plan:     Visit Diagnoses: Autoimmune disease (HCC) - +ANA, low C3, arthralgia, fatigue, Sicca symptoms, and Raynaud's: She has not had any signs or symptoms of an autoimmune disease flare.  She has clinically been doing off of plaquenil since May 2023.  No synovitis was noted on examination today.  She has not had any symptoms of Raynaud's phenomenon, recent rashes, hair loss.  No oral or nasal ulcerations.  No cervical lymphadenopathy.  Her lungs were clear to auscultation today.  No pleuritic chest pain. Lab work from 04/24/2023 was reviewed today in the office: ANA 1: 320 nuclear, dense fine speckled, no proteinuria, complements within normal limits, double-stranded DNA negative, ESR within normal limits.  The following lab work will be obtained today for further evaluation.  She does not currently require immunosuppression.  She was advised to notify us if she develops signs or symptoms of a flare.  She will follow-up in the office in 6 months or sooner if needed. - Plan: Protein / creatinine ratio, urine, CBC with Differential/Platelet, COMPLETE METABOLIC PANEL WITH GFR, Anti-DNA antibody, double-stranded, C3 and C4, Sedimentation rate, ANA  High risk medication use - Discontinued hydroxychloroquine in May 2023.  (dsDNA-, complements WNL, and ESR WNL). PLQ Eye Exam: 10/27/2021.  She does not require immunosuppressive therapy at this time.  Raynaud's disease without gangrene: Not currently symptomatic.  No digital ulcerations  or signs of gangrene.  No signs of sclerodactyly.  Sicca syndrome [facility]): She occasionally has some increased eye dryness while wearing contacts but is otherwise not been experiencing any sicca symptoms.  She  has not needed to use any over-the-counter products for symptomatic relief.  Primary osteoarthritis of both hands: Patient has noticed some increased prominence and intermittent soreness involving the right thumb specifically the right first MCP joint.  No injury prior to the onset of symptoms.  According to the patient she developed cellulitis in June 2024 and since then has noticed increased prominence.  On examination today no synovitis was noted.  Discussed that if she continues to have persistent soreness or stiffness she can try using Voltaren gel topically as needed for pain relief.  She will notify us if her symptoms persist or worsen.  Trochanteric bursitis of both hips: Patient has tenderness ovation over bilateral trochanteric bursa.  She has been going to Pilates 3 days a week and has been trying to perform stretching exercises on a regular basis which helps to alleviate her symptoms.  Fibromyalgia: She is not currently experiencing any signs or symptoms of a fibromyalgia flare.  Her energy level has been stable.  She continues to go to Pilates 3 days a week.  Other fatigue: Her energy level has been stable.  She continues to go to Pilates 3 days a week.  Other medical conditions are listed as follows:  Vitamin D deficiency  History of anxiety  History of hyperlipidemia: She remains on Crestor as prescribed.  History of IBS  Orders: Orders Placed This Encounter  Procedures   Protein / creatinine ratio, urine   CBC with Differential/Platelet   COMPLETE METABOLIC PANEL WITH GFR   Anti-DNA antibody, double-stranded   C3 and C4   Sedimentation rate   ANA   No orders of the defined types were placed in this encounter.    Follow-Up Instructions: Return in about 6 months  (around 06/16/2024) for Autoimmune Disease, Osteoarthritis.   Gearldine Bienenstock, PA-C  Note - This record has been created using Dragon software.  Chart creation errors have been sought, but may not always  have been located. Such creation errors do not reflect on  the standard of medical care.

## 2023-12-10 ENCOUNTER — Encounter: Payer: 59 | Admitting: Family Medicine

## 2023-12-12 ENCOUNTER — Encounter: Payer: 59 | Admitting: Family Medicine

## 2023-12-13 DIAGNOSIS — F4320 Adjustment disorder, unspecified: Secondary | ICD-10-CM | POA: Diagnosis not present

## 2023-12-18 ENCOUNTER — Ambulatory Visit: Payer: Commercial Managed Care - PPO | Attending: Physician Assistant | Admitting: Physician Assistant

## 2023-12-18 ENCOUNTER — Encounter: Payer: Self-pay | Admitting: Physician Assistant

## 2023-12-18 VITALS — BP 111/73 | HR 71 | Resp 16 | Ht 67.0 in | Wt 204.0 lb

## 2023-12-18 DIAGNOSIS — M19042 Primary osteoarthritis, left hand: Secondary | ICD-10-CM

## 2023-12-18 DIAGNOSIS — R5383 Other fatigue: Secondary | ICD-10-CM

## 2023-12-18 DIAGNOSIS — M19041 Primary osteoarthritis, right hand: Secondary | ICD-10-CM | POA: Diagnosis not present

## 2023-12-18 DIAGNOSIS — Z8719 Personal history of other diseases of the digestive system: Secondary | ICD-10-CM

## 2023-12-18 DIAGNOSIS — I7300 Raynaud's syndrome without gangrene: Secondary | ICD-10-CM | POA: Diagnosis not present

## 2023-12-18 DIAGNOSIS — E559 Vitamin D deficiency, unspecified: Secondary | ICD-10-CM | POA: Diagnosis not present

## 2023-12-18 DIAGNOSIS — M7061 Trochanteric bursitis, right hip: Secondary | ICD-10-CM | POA: Diagnosis not present

## 2023-12-18 DIAGNOSIS — Z8639 Personal history of other endocrine, nutritional and metabolic disease: Secondary | ICD-10-CM | POA: Diagnosis not present

## 2023-12-18 DIAGNOSIS — Z8659 Personal history of other mental and behavioral disorders: Secondary | ICD-10-CM

## 2023-12-18 DIAGNOSIS — M7062 Trochanteric bursitis, left hip: Secondary | ICD-10-CM

## 2023-12-18 DIAGNOSIS — Z79899 Other long term (current) drug therapy: Secondary | ICD-10-CM

## 2023-12-18 DIAGNOSIS — M359 Systemic involvement of connective tissue, unspecified: Secondary | ICD-10-CM | POA: Diagnosis not present

## 2023-12-18 DIAGNOSIS — M797 Fibromyalgia: Secondary | ICD-10-CM | POA: Diagnosis not present

## 2023-12-18 DIAGNOSIS — M3500 Sicca syndrome, unspecified: Secondary | ICD-10-CM | POA: Diagnosis not present

## 2023-12-19 NOTE — Progress Notes (Signed)
Protein creatinine ratio WNL  CBC and CMP WNL ESR WNL Complements WNL

## 2023-12-20 LAB — COMPLETE METABOLIC PANEL WITHOUT GFR
AG Ratio: 1.7 (calc) (ref 1.0–2.5)
ALT: 21 U/L (ref 6–29)
AST: 18 U/L (ref 10–35)
Albumin: 4.7 g/dL (ref 3.6–5.1)
Alkaline phosphatase (APISO): 64 U/L (ref 37–153)
BUN: 20 mg/dL (ref 7–25)
CO2: 30 mmol/L (ref 20–32)
Calcium: 10.3 mg/dL (ref 8.6–10.4)
Chloride: 102 mmol/L (ref 98–110)
Creat: 0.92 mg/dL (ref 0.50–1.03)
Globulin: 2.7 g/dL (ref 1.9–3.7)
Glucose, Bld: 89 mg/dL (ref 65–99)
Potassium: 4.2 mmol/L (ref 3.5–5.3)
Sodium: 140 mmol/L (ref 135–146)
Total Bilirubin: 0.3 mg/dL (ref 0.2–1.2)
Total Protein: 7.4 g/dL (ref 6.1–8.1)
eGFR: 72 mL/min/1.73m2

## 2023-12-20 LAB — CBC WITH DIFFERENTIAL/PLATELET
Absolute Lymphocytes: 1419 {cells}/uL (ref 850–3900)
Absolute Monocytes: 435 {cells}/uL (ref 200–950)
Basophils Absolute: 28 {cells}/uL (ref 0–200)
Basophils Relative: 0.5 %
Eosinophils Absolute: 61 {cells}/uL (ref 15–500)
Eosinophils Relative: 1.1 %
HCT: 41.6 % (ref 35.0–45.0)
Hemoglobin: 14 g/dL (ref 11.7–15.5)
MCH: 28.5 pg (ref 27.0–33.0)
MCHC: 33.7 g/dL (ref 32.0–36.0)
MCV: 84.6 fL (ref 80.0–100.0)
MPV: 11.3 fL (ref 7.5–12.5)
Monocytes Relative: 7.9 %
Neutro Abs: 3559 {cells}/uL (ref 1500–7800)
Neutrophils Relative %: 64.7 %
Platelets: 270 Thousand/uL (ref 140–400)
RBC: 4.92 Million/uL (ref 3.80–5.10)
RDW: 13.2 % (ref 11.0–15.0)
Total Lymphocyte: 25.8 %
WBC: 5.5 Thousand/uL (ref 3.8–10.8)

## 2023-12-20 LAB — C3 AND C4
C3 Complement: 157 mg/dL (ref 83–193)
C4 Complement: 34 mg/dL (ref 15–57)

## 2023-12-20 LAB — ANTI-DNA ANTIBODY, DOUBLE-STRANDED: ds DNA Ab: <1 [IU]/mL

## 2023-12-20 LAB — PROTEIN / CREATININE RATIO, URINE
Creatinine, Urine: 51 mg/dL (ref 20–275)
Protein/Creat Ratio: 78 mg/g{creat} (ref 24–184)
Protein/Creatinine Ratio: 0.078 mg/mg{creat} (ref 0.024–0.184)
Total Protein, Urine: 4 mg/dL — ABNORMAL LOW (ref 5–24)

## 2023-12-20 LAB — ANA: Anti Nuclear Antibody (ANA): POSITIVE — AB

## 2023-12-20 LAB — SEDIMENTATION RATE: Sed Rate: 6 mm/h (ref 0–30)

## 2023-12-20 LAB — ANTI-NUCLEAR AB-TITER (ANA TITER)
ANA TITER: 1:320 {titer} — ABNORMAL HIGH
ANA Titer 1: 1:80 {titer} — ABNORMAL HIGH

## 2023-12-20 NOTE — Progress Notes (Signed)
dsDNA is negative.  ANA is pending

## 2023-12-23 NOTE — Progress Notes (Signed)
ANA remains positive- 1:320NH and 1:80NS  Labs are not consistent with a flare

## 2023-12-27 DIAGNOSIS — F4320 Adjustment disorder, unspecified: Secondary | ICD-10-CM | POA: Diagnosis not present

## 2024-01-08 DIAGNOSIS — F4320 Adjustment disorder, unspecified: Secondary | ICD-10-CM | POA: Diagnosis not present

## 2024-01-22 ENCOUNTER — Other Ambulatory Visit (HOSPITAL_BASED_OUTPATIENT_CLINIC_OR_DEPARTMENT_OTHER): Payer: Self-pay

## 2024-01-22 MED ORDER — COVID-19 MRNA VAC-TRIS(PFIZER) 30 MCG/0.3ML IM SUSY
0.3000 mL | PREFILLED_SYRINGE | Freq: Once | INTRAMUSCULAR | 0 refills | Status: AC
  Filled 2024-01-22: qty 0.3, 1d supply, fill #0

## 2024-01-27 ENCOUNTER — Encounter (HOSPITAL_BASED_OUTPATIENT_CLINIC_OR_DEPARTMENT_OTHER): Payer: Self-pay | Admitting: Pharmacist

## 2024-01-27 ENCOUNTER — Ambulatory Visit (INDEPENDENT_AMBULATORY_CARE_PROVIDER_SITE_OTHER): Payer: 59 | Admitting: Family Medicine

## 2024-01-27 ENCOUNTER — Encounter: Payer: Self-pay | Admitting: Family Medicine

## 2024-01-27 ENCOUNTER — Other Ambulatory Visit (HOSPITAL_BASED_OUTPATIENT_CLINIC_OR_DEPARTMENT_OTHER): Payer: Self-pay

## 2024-01-27 ENCOUNTER — Other Ambulatory Visit: Payer: Self-pay

## 2024-01-27 VITALS — BP 102/70 | HR 73 | Temp 97.6°F | Resp 18 | Ht 67.0 in | Wt 198.6 lb

## 2024-01-27 DIAGNOSIS — E782 Mixed hyperlipidemia: Secondary | ICD-10-CM

## 2024-01-27 DIAGNOSIS — Z0000 Encounter for general adult medical examination without abnormal findings: Secondary | ICD-10-CM

## 2024-01-27 DIAGNOSIS — E559 Vitamin D deficiency, unspecified: Secondary | ICD-10-CM | POA: Diagnosis not present

## 2024-01-27 MED ORDER — FLUTICASONE PROPIONATE 0.005 % EX OINT
1.0000 | TOPICAL_OINTMENT | Freq: Two times a day (BID) | CUTANEOUS | 3 refills | Status: AC
  Filled 2024-01-27: qty 30, 15d supply, fill #0

## 2024-01-27 NOTE — Patient Instructions (Signed)
 Preventive Care 16-59 Years Old, Female  Preventive care refers to lifestyle choices and visits with your health care provider that can promote health and wellness. Preventive care visits are also called wellness exams.  What can I expect for my preventive care visit?  Counseling  Your health care provider may ask you questions about your:  Medical history, including:  Past medical problems.  Family medical history.  Pregnancy history.  Current health, including:  Menstrual cycle.  Method of birth control.  Emotional well-being.  Home life and relationship well-being.  Sexual activity and sexual health.  Lifestyle, including:  Alcohol, nicotine or tobacco, and drug use.  Access to firearms.  Diet, exercise, and sleep habits.  Work and work Astronomer.  Sunscreen use.  Safety issues such as seatbelt and bike helmet use.  Physical exam  Your health care provider will check your:  Height and weight. These may be used to calculate your BMI (body mass index). BMI is a measurement that tells if you are at a healthy weight.  Waist circumference. This measures the distance around your waistline. This measurement also tells if you are at a healthy weight and may help predict your risk of certain diseases, such as type 2 diabetes and high blood pressure.  Heart rate and blood pressure.  Body temperature.  Skin for abnormal spots.  What immunizations do I need?    Vaccines are usually given at various ages, according to a schedule. Your health care provider will recommend vaccines for you based on your age, medical history, and lifestyle or other factors, such as travel or where you work.  What tests do I need?  Screening  Your health care provider may recommend screening tests for certain conditions. This may include:  Lipid and cholesterol levels.  Diabetes screening. This is done by checking your blood sugar (glucose) after you have not eaten for a while (fasting).  Pelvic exam and Pap test.  Hepatitis B test.  Hepatitis C  test.  HIV (human immunodeficiency virus) test.  STI (sexually transmitted infection) testing, if you are at risk.  Lung cancer screening.  Colorectal cancer screening.  Mammogram. Talk with your health care provider about when you should start having regular mammograms. This may depend on whether you have a family history of breast cancer.  BRCA-related cancer screening. This may be done if you have a family history of breast, ovarian, tubal, or peritoneal cancers.  Bone density scan. This is done to screen for osteoporosis.  Talk with your health care provider about your test results, treatment options, and if necessary, the need for more tests.  Follow these instructions at home:  Eating and drinking    Eat a diet that includes fresh fruits and vegetables, whole grains, lean protein, and low-fat dairy products.  Take vitamin and mineral supplements as recommended by your health care provider.  Do not drink alcohol if:  Your health care provider tells you not to drink.  You are pregnant, may be pregnant, or are planning to become pregnant.  If you drink alcohol:  Limit how much you have to 0-1 drink a day.  Know how much alcohol is in your drink. In the [place] one drink equals one 12 oz bottle of beer (355 mL), one 5 oz glass of wine (148 mL), or one 1 oz glass of hard liquor (44 mL).  Lifestyle  Brush your teeth every morning and night with fluoride toothpaste. Floss one time each day.  Exercise for at least  30 minutes 5 or more days each week.  Do not use any products that contain nicotine or tobacco. These products include cigarettes, chewing tobacco, and vaping devices, such as e-cigarettes. If you need help quitting, ask your health care provider.  Do not use drugs.  If you are sexually active, practice safe sex. Use a condom or other form of protection to prevent STIs.  If you do not wish to become pregnant, use a form of birth control. If you plan to become pregnant, see your health care provider for a  prepregnancy visit.  Take aspirin only as told by your health care provider. Make sure that you understand how much to take and what form to take. Work with your health care provider to find out whether it is safe and beneficial for you to take aspirin daily.  Find healthy ways to manage stress, such as:  Meditation, yoga, or listening to music.  Journaling.  Talking to a trusted person.  Spending time with friends and family.  Minimize exposure to UV radiation to reduce your risk of skin cancer.  Safety  Always wear your seat belt while driving or riding in a vehicle.  Do not drive:  If you have been drinking alcohol. Do not ride with someone who has been drinking.  When you are tired or distracted.  While texting.  If you have been using any mind-altering substances or drugs.  Wear a helmet and other protective equipment during sports activities.  If you have firearms in your house, make sure you follow all gun safety procedures.  Seek help if you have been physically or sexually abused.  What's next?  Visit your health care provider once a year for an annual wellness visit.  Ask your health care provider how often you should have your eyes and teeth checked.  Stay up to date on all vaccines.  This information is not intended to replace advice given to you by your health care provider. Make sure you discuss any questions you have with your health care provider.  Document Revised: 05/10/2021 Document Reviewed: 05/10/2021  Elsevier Patient Education  2024 [employer].

## 2024-01-27 NOTE — Assessment & Plan Note (Signed)
 Ghm utd Check labs See AVS Health Maintenance  Topic Date Due   Cervical Cancer Screening (HPV/Pap Cotest)  02/06/2018   MAMMOGRAM  12/06/2020   COVID-19 Vaccine (7 - 2024-25 season) 03/18/2024   Colonoscopy  04/25/2029   DTaP/Tdap/Td (3 - Td or Tdap) 08/06/2032   INFLUENZA VACCINE  Completed   Hepatitis C Screening  Completed   HIV Screening  Completed   Zoster Vaccines- Shingrix  Completed   HPV VACCINES  Aged Out   Pneumococcal Vaccine 59-59 Years old  Discontinued

## 2024-01-27 NOTE — Progress Notes (Signed)
 Established Patient Office Visit  Subjective   Patient ID: Sherry Jacobs, female    DOB: Feb 25, 1966  Age: 58 y.o. MRN: [id number]  Chief Complaint  Patient presents with   Annual Exam    Pt states not fasting     HPI Discussed the use of AI scribe software for clinical note transcription with the patient, who gave verbal consent to proceed.  History of Present Illness   Sherry Jacobs is a 58 year old female who presents for an annual physical exam.  She experiences intermittent flare-ups of dry skin, primarily on her forearm, characterized by intensely itchy spots that can disrupt her sleep. These episodes occur approximately twice a year and are managed with an ointment recommended by her dermatologist, applied twice daily for a few days until symptoms resolve.  She has a history of a positive ANA, monitored since age 2, with stable levels at 1:320 and no current symptoms. She was previously on Plaquenil but has been off it for nearly two years without a flare-up. In the past, elevated ANA levels have caused joint pain, but this has not occurred recently.  She manages anxiety related to presentations or MRIs with Xanax and propranolol, used sparingly about two to three times a year. She carries these medications for reassurance and prefers a lower dose due to sedative effects. Additionally, she takes rosuvastatin and a vitamin regularly.  A previous bone density test was normal, and a calcium level of 10.5 noted in January was considered within normal limits. She has a history of fibromyalgia, initially suspected but later questioned, and mentions irritable bowel syndrome, osteoporosis, palpitations, tinea, varicose veins, and vitamin D deficiency.      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   LEG PAIN, LEFT 10/28/2008   Anxiety state 06/23/2008   HYPERLIPIDEMIA 10/02/2007   Palpitations 09/09/2007   ANA POSITIVE, HX OF 09/09/2007   IRRITABLE BOWEL SYNDROME 04/11/2007   Past Medical History:  Diagnosis Date   ANA positive    Arthralgia    Autoimmune disease (HCC)    ANA positive   Endometrial polyp    Hyperlipidemia    IBS (irritable bowel syndrome)    Iron deficiency anemia    Lactose intolerance in adult    PONV (postoperative nausea and vomiting)    Social anxiety disorder    Wears contact lenses    Past Surgical History:  Procedure Laterality Date   APPENDECTOMY  1981   DILATATION & CURETTAGE/HYSTEROSCOPY WITH MYOSURE N/A 10/28/2014   Procedure: DILATATION & CURETTAGE/HYSTEROSCOPY WITH MYOSURE;  Surgeon: Zelphia Cairo, MD;  Location: [facility] [place];  Service: Gynecology;  Laterality: N/A;   RIGHT MODIFIED NECK DISSECTION W/ REMOVAL BENIGN RIGHT PARAPHARYNGEAL SPACE MASS AND LYMPH NODES  08-02-2004   benign bronchogenic cysts / negative lymph nodes   Social History   Tobacco Use   Smoking status: Never    Passive exposure: Never   Smokeless tobacco: Never  Vaping Use   Vaping status: Never Used  Substance Use Topics  Alcohol use: Yes    Comment: social    Drug use: Never   Social History   Socioeconomic History   Marital status: Married    Spouse name: Not on file   Number of children: 2   Years of education: Not on file   Highest education level: Master's degree (e.g., MA, MS, MEng, MEd, MSW, MBA)  Occupational History   Occupation: Academic librarian: [employer]  Tobacco Use   Smoking status: Never    Passive exposure: Never   Smokeless tobacco: Never  Vaping Use    Vaping status: Never Used  Substance and Sexual Activity   Alcohol use: Yes    Comment: social    Drug use: Never   Sexual activity: Not on file  Other Topics Concern   Not on file  Social History Narrative   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   Living at home with husband and two kids                        Social Drivers of Health   Financial Resource Strain: Low Risk  (11/11/2023)   Overall Financial Resource Strain (CARDIA)    Difficulty of Paying Living Expenses: Not hard at all  Food Insecurity: No Food Insecurity (11/11/2023)   Hunger Vital Sign    Worried About Running Out of Food in the Last Year: Never true    Ran Out of Food in the Last Year: Never true  Transportation Needs: No Transportation Needs (11/11/2023)   PRAPARE - Administrator, Civil Service (Medical): No    Lack of Transportation (Non-Medical): No  Physical Activity: Insufficiently Active (11/11/2023)   Exercise Vital Sign    Days of Exercise per Week: 2 days    Minutes of Exercise per Session: 20 min  Stress: No Stress Concern Present (11/11/2023)   [employer] of Occupational Health - Occupational Stress Questionnaire    Feeling of Stress : Not at all  Social Connections: Moderately Isolated (11/11/2023)   Social Connection and Isolation Panel [NHANES]    Frequency of Communication with Friends and Family: More than three times a week    Frequency of Social Gatherings with Friends and Family: Twice a week    Attends Religious Services: Never    Database administrator or Organizations: No    Attends Engineer, structural: Not on file    Marital Status: Married  Catering manager Violence: Not on file   Family Status  Relation Name Status   Mother  Alive   Father  Alive   Sister  Alive   Sister  Alive   Brother  Alive   MGF  (Not Specified)   PGM  (Not Specified)   Daughter  Alive   Son  Alive   Other  (Not Specified)   Neg Hx  (Not Specified)  No  partnership data on file   Family History  Problem Relation Age of Onset   Breast cancer Mother    Colon polyps Father    Melanoma Maternal Grandfather    Breast cancer Paternal Grandmother    Asthma Daughter        exercise induced    Asthma Son        exercise induced    Diabetes Other    Hypertension Other    Alcohol abuse Other    Hyperlipidemia Other    Arthritis Other    Stroke Other  Thyroid disease Other    Uterine cancer Other    Colon cancer Neg Hx    Esophageal cancer Neg Hx    Stomach cancer Neg Hx    Rectal cancer Neg Hx    Allergies  Allergen Reactions   Sulfa Antibiotics Nausea And Vomiting and Nausea Only      Review of Systems  Constitutional:  Negative for chills, fever and malaise/fatigue.  HENT:  Negative for congestion and hearing loss.   Eyes:  Negative for blurred vision and discharge.  Respiratory:  Negative for cough, sputum production and shortness of breath.   Cardiovascular:  Negative for chest pain, palpitations and leg swelling.  Gastrointestinal:  Negative for abdominal pain, blood in stool, constipation, diarrhea, heartburn, nausea and vomiting.  Genitourinary:  Negative for dysuria, frequency, hematuria and urgency.  Musculoskeletal:  Negative for back pain, falls and myalgias.  Skin:  Negative for rash.  Neurological:  Negative for dizziness, sensory change, loss of consciousness, weakness and headaches.  Endo/Heme/Allergies:  Negative for environmental allergies. Does not bruise/bleed easily.  Psychiatric/Behavioral:  Negative for depression and suicidal ideas. The patient is not nervous/anxious and does not have insomnia.       Objective:     BP 102/70 (BP Location: Right Arm, Patient Position: Sitting, Cuff Size: Normal)   Pulse 73   Temp 97.6 F (36.4 C) (Oral)   Resp 18   Ht 5\' 7"  (1.702 m)   Wt 198 lb 9.6 oz (90.1 kg)   LMP 04/07/2017   SpO2 99%   BMI 31.11 kg/m  BP Readings from Last 3 Encounters:  01/27/24  102/70  12/18/23 111/73  11/12/23 110/80   Wt Readings from Last 3 Encounters:  01/27/24 198 lb 9.6 oz (90.1 kg)  12/18/23 204 lb (92.5 kg)  11/12/23 206 lb 12.8 oz (93.8 kg)   SpO2 Readings from Last 3 Encounters:  01/27/24 99%  11/12/23 98%  05/26/23 98%      Physical Exam Vitals and nursing note reviewed.  Constitutional:      General: She is not in acute distress.    Appearance: Normal appearance. She is well-developed.  HENT:     Head: Normocephalic and atraumatic.     Right Ear: Tympanic membrane, ear canal and external ear normal. There is no impacted cerumen.     Left Ear: Tympanic membrane, ear canal and external ear normal. There is no impacted cerumen.     Nose: Nose normal.     Mouth/Throat:     Mouth: Mucous membranes are moist.     Pharynx: Oropharynx is clear. No oropharyngeal exudate or posterior oropharyngeal erythema.  Eyes:     General: No scleral icterus.       Right eye: No discharge.        Left eye: No discharge.     Conjunctiva/sclera: Conjunctivae normal.     Pupils: Pupils are equal, round, and reactive to light.  Neck:     Thyroid: No thyromegaly or thyroid tenderness.     Vascular: No JVD.  Cardiovascular:     Rate and Rhythm: Normal rate and regular rhythm.     Heart sounds: Normal heart sounds. No murmur heard. Pulmonary:     Effort: Pulmonary effort is normal. No respiratory distress.     Breath sounds: Normal breath sounds.  Abdominal:     General: Bowel sounds are normal. There is no distension.     Palpations: Abdomen is soft. There is no mass.  Tenderness: There is no abdominal tenderness. There is no guarding or rebound.  Musculoskeletal:        General: Normal range of motion.     Cervical back: Normal range of motion and neck supple.     Right lower leg: No edema.     Left lower leg: No edema.  Lymphadenopathy:     Cervical: No cervical adenopathy.  Skin:    General: Skin is warm and dry.     Findings: No erythema or  rash.  Neurological:     Mental Status: She is alert and oriented to person, place, and time.     Cranial Nerves: No cranial nerve deficit.     Deep Tendon Reflexes: Reflexes are normal and symmetric.  Psychiatric:        Mood and Affect: Mood normal.        Behavior: Behavior normal.        Thought Content: Thought content normal.        Judgment: Judgment normal.      No results found for any visits on 01/27/24.  Last CBC Lab Results  Component Value Date   WBC 5.5 12/18/2023   HGB 14.0 12/18/2023   HCT 41.6 12/18/2023   MCV 84.6 12/18/2023   MCH 28.5 12/18/2023   RDW 13.2 12/18/2023   PLT 270 12/18/2023   Last metabolic panel Lab Results  Component Value Date   GLUCOSE 89 12/18/2023   NA 140 12/18/2023   K 4.2 12/18/2023   CL 102 12/18/2023   CO2 30 12/18/2023   BUN 20 12/18/2023   CREATININE 0.92 12/18/2023   EGFR 72 12/18/2023   CALCIUM 10.3 12/18/2023   PROT 7.4 12/18/2023   ALBUMIN 4.6 11/12/2023   BILITOT 0.3 12/18/2023   ALKPHOS 74 11/12/2023   AST 18 12/18/2023   ALT 21 12/18/2023   ANIONGAP 9 08/04/2020   Last lipids Lab Results  Component Value Date   CHOL 165 09/21/2021   HDL 55.60 09/21/2021   LDLCALC 97 09/21/2021   LDLDIRECT 137.7 07/31/2013   TRIG 65.0 09/21/2021   CHOLHDL 3 09/21/2021   Last hemoglobin A1c No results found for: "HGBA1C" Last thyroid functions Lab Results  Component Value Date   TSH 4.85 06/05/2021   Last vitamin D Lab Results  Component Value Date   VD25OH 40 01/10/2021   Last vitamin B12 and Folate Lab Results  Component Value Date   VITAMINB12 303 03/07/2011   FOLATE 22.3 03/07/2011      The 10-year ASCVD risk score (Arnett DK, et al., 2019) is: 1.4%    Assessment & Plan:   Problem List Items Addressed This Visit       Unprioritized   Vitamin D deficiency   Relevant Orders   VITAMIN D 25 Hydroxy (Vit-D Deficiency, Fractures)   Preventative health care - Primary   Ghm utd Check labs See  AVS Health Maintenance  Topic Date Due   Cervical Cancer Screening (HPV/Pap Cotest)  02/06/2018   MAMMOGRAM  12/06/2020   COVID-19 Vaccine (7 - 2024-25 season) 03/18/2024   Colonoscopy  04/25/2029   DTaP/Tdap/Td (3 - Td or Tdap) 08/06/2032   INFLUENZA VACCINE  Completed   Hepatitis C Screening  Completed   HIV Screening  Completed   Zoster Vaccines- Shingrix  Completed   HPV VACCINES  Aged Out   Pneumococcal Vaccine 39-44 Years old  Discontinued         Relevant Orders   CBC with Differential/Platelet   Comprehensive  metabolic panel   Lipid panel   TSH   Other Visit Diagnoses       Mixed hyperlipidemia         Assessment and Plan    Dermatitis Chronic dermatitis on the forearm causes intense pruritus and disrupts sleep, flaring up about twice a year. It is managed with a topical ointment as needed. Refill the ointment.  Anxiety Anxiety is managed with Xanax and propranolol for situational stressors. Xanax is used minimally, 2-3 times a year, and propranolol is used sparingly. Lower doses are preferred due to sedative effects, and excess medication is returned to the pharmacy. Continue both medications as needed.  Autoimmune Disease A positive ANA (1:320) is present without symptoms. Off Plaquenil for nearly two years without flare-ups. Slightly elevated calcium levels are monitored for potential parathyroid or vitamin D issues. No specific autoimmune diagnosis is confirmed. Monitor ANA levels and symptoms, and follow up with a rheumatologist.  Hyperlipidemia Hyperlipidemia is managed with rosuvastatin. Continue rosuvastatin.  General Health Maintenance Routine health maintenance is up to date. A shingles vaccine was received, and a colonoscopy was done two years ago with a follow-up in seven years. Bone density is normal, and regular eye and dental check-ups are maintained. Schedule blood work and bring the bone density scan for records.  Follow-up Make an appointment  for blood work and follow up with the primary care physician as needed.        No follow-ups on file.    Donato Schultz, DO

## 2024-02-11 NOTE — Addendum Note (Signed)
 Addended by: Thelma Barge D on: 02/11/2024 10:30 AM   Modules accepted: Orders

## 2024-02-12 DIAGNOSIS — F4320 Adjustment disorder, unspecified: Secondary | ICD-10-CM | POA: Diagnosis not present

## 2024-02-13 DIAGNOSIS — Z01419 Encounter for gynecological examination (general) (routine) without abnormal findings: Secondary | ICD-10-CM | POA: Diagnosis not present

## 2024-02-13 DIAGNOSIS — Z6831 Body mass index (BMI) 31.0-31.9, adult: Secondary | ICD-10-CM | POA: Diagnosis not present

## 2024-02-13 DIAGNOSIS — Z1151 Encounter for screening for human papillomavirus (HPV): Secondary | ICD-10-CM | POA: Diagnosis not present

## 2024-02-13 DIAGNOSIS — Z124 Encounter for screening for malignant neoplasm of cervix: Secondary | ICD-10-CM | POA: Diagnosis not present

## 2024-02-18 ENCOUNTER — Other Ambulatory Visit (INDEPENDENT_AMBULATORY_CARE_PROVIDER_SITE_OTHER)

## 2024-02-18 DIAGNOSIS — Z0000 Encounter for general adult medical examination without abnormal findings: Secondary | ICD-10-CM

## 2024-02-18 DIAGNOSIS — E559 Vitamin D deficiency, unspecified: Secondary | ICD-10-CM | POA: Diagnosis not present

## 2024-02-18 LAB — COMPREHENSIVE METABOLIC PANEL
ALT: 19 U/L (ref 0–35)
AST: 16 U/L (ref 0–37)
Albumin: 4.7 g/dL (ref 3.5–5.2)
Alkaline Phosphatase: 62 U/L (ref 39–117)
BUN: 16 mg/dL (ref 6–23)
CO2: 29 meq/L (ref 19–32)
Calcium: 10.1 mg/dL (ref 8.4–10.5)
Chloride: 101 meq/L (ref 96–112)
Creatinine, Ser: 0.91 mg/dL (ref 0.40–1.20)
GFR: 69.28 mL/min (ref >60.00)
Glucose, Bld: 94 mg/dL (ref 70–99)
Potassium: 4.1 meq/L (ref 3.5–5.1)
Sodium: 139 meq/L (ref 135–145)
Total Bilirubin: 0.6 mg/dL (ref 0.2–1.2)
Total Protein: 7.2 g/dL (ref 6.0–8.3)

## 2024-02-18 LAB — LIPID PANEL
Cholesterol: 170 mg/dL (ref 0–200)
HDL: 52.9 mg/dL (ref >39.00)
LDL Cholesterol: 96 mg/dL (ref 0–99)
NonHDL: 116.97
Total CHOL/HDL Ratio: 3
Triglycerides: 103 mg/dL (ref 0.0–149.0)
VLDL: 20.6 mg/dL (ref 0.0–40.0)

## 2024-02-18 LAB — CBC WITH DIFFERENTIAL/PLATELET
Basophils Absolute: 0 10*3/uL (ref 0.0–0.1)
Basophils Relative: 0.6 % (ref 0.0–3.0)
Eosinophils Absolute: 0.1 10*3/uL (ref 0.0–0.7)
Eosinophils Relative: 1.4 % (ref 0.0–5.0)
HCT: 42.1 % (ref 36.0–46.0)
Hemoglobin: 14 g/dL (ref 12.0–15.0)
Lymphocytes Relative: 27.7 % (ref 12.0–46.0)
Lymphs Abs: 1.4 10*3/uL (ref 0.7–4.0)
MCHC: 33.2 g/dL (ref 30.0–36.0)
MCV: 86.1 fl (ref 78.0–100.0)
Monocytes Absolute: 0.5 10*3/uL (ref 0.1–1.0)
Monocytes Relative: 9.1 % (ref 3.0–12.0)
Neutro Abs: 3.1 10*3/uL (ref 1.4–7.7)
Neutrophils Relative %: 61.2 % (ref 43.0–77.0)
Platelets: 264 10*3/uL (ref 150.0–400.0)
RBC: 4.89 Mil/uL (ref 3.87–5.11)
RDW: 13.9 % (ref 11.5–15.5)
WBC: 5.1 10*3/uL (ref 4.0–10.5)

## 2024-02-18 LAB — VITAMIN D 25 HYDROXY (VIT D DEFICIENCY, FRACTURES): VITD: 41.91 ng/mL (ref 30.00–100.00)

## 2024-02-18 LAB — TSH: TSH: 2.41 u[IU]/mL (ref 0.35–5.50)

## 2024-02-21 ENCOUNTER — Encounter: Payer: Self-pay | Admitting: Family

## 2024-02-24 DIAGNOSIS — Z1231 Encounter for screening mammogram for malignant neoplasm of breast: Secondary | ICD-10-CM | POA: Diagnosis not present

## 2024-03-04 DIAGNOSIS — F4320 Adjustment disorder, unspecified: Secondary | ICD-10-CM | POA: Diagnosis not present

## 2024-03-18 DIAGNOSIS — F4320 Adjustment disorder, unspecified: Secondary | ICD-10-CM | POA: Diagnosis not present

## 2024-03-24 DIAGNOSIS — L609 Nail disorder, unspecified: Secondary | ICD-10-CM | POA: Diagnosis not present

## 2024-03-24 DIAGNOSIS — D2230 Melanocytic nevi of unspecified part of face: Secondary | ICD-10-CM | POA: Diagnosis not present

## 2024-03-24 DIAGNOSIS — D2221 Melanocytic nevi of right ear and external auricular canal: Secondary | ICD-10-CM | POA: Diagnosis not present

## 2024-03-24 DIAGNOSIS — Z86018 Personal history of other benign neoplasm: Secondary | ICD-10-CM | POA: Diagnosis not present

## 2024-03-24 DIAGNOSIS — L814 Other melanin hyperpigmentation: Secondary | ICD-10-CM | POA: Diagnosis not present

## 2024-03-24 DIAGNOSIS — L821 Other seborrheic keratosis: Secondary | ICD-10-CM | POA: Diagnosis not present

## 2024-03-24 DIAGNOSIS — L578 Other skin changes due to chronic exposure to nonionizing radiation: Secondary | ICD-10-CM | POA: Diagnosis not present

## 2024-03-24 DIAGNOSIS — D225 Melanocytic nevi of trunk: Secondary | ICD-10-CM | POA: Diagnosis not present

## 2024-04-16 DIAGNOSIS — F4320 Adjustment disorder, unspecified: Secondary | ICD-10-CM | POA: Diagnosis not present

## 2024-04-28 ENCOUNTER — Encounter: Payer: Self-pay | Admitting: Family Medicine

## 2024-04-29 ENCOUNTER — Other Ambulatory Visit: Payer: Self-pay | Admitting: Family Medicine

## 2024-04-29 ENCOUNTER — Other Ambulatory Visit (HOSPITAL_BASED_OUTPATIENT_CLINIC_OR_DEPARTMENT_OTHER): Payer: Self-pay

## 2024-04-29 MED ORDER — CLOTRIMAZOLE-BETAMETHASONE 1-0.05 % EX CREA
1.0000 | TOPICAL_CREAM | Freq: Every day | CUTANEOUS | 2 refills | Status: DC
  Filled 2024-04-29: qty 30, 30d supply, fill #0

## 2024-04-29 NOTE — Telephone Encounter (Signed)
 Okay to refill?

## 2024-05-21 DIAGNOSIS — F4320 Adjustment disorder, unspecified: Secondary | ICD-10-CM | POA: Diagnosis not present

## 2024-05-30 ENCOUNTER — Telehealth: Admitting: Nurse Practitioner

## 2024-05-30 DIAGNOSIS — R3989 Other symptoms and signs involving the genitourinary system: Secondary | ICD-10-CM | POA: Diagnosis not present

## 2024-05-30 MED ORDER — CEPHALEXIN 500 MG PO CAPS: 500.0000 mg | ORAL_CAPSULE | Freq: Two times a day (BID) | ORAL | 0 refills | Status: AC

## 2024-05-30 NOTE — Progress Notes (Signed)
 I have spent 5 minutes in review of e-visit questionnaire, review and updating patient chart, medical decision making and response to patient.   Claiborne Rigg, NP

## 2024-05-30 NOTE — Progress Notes (Signed)

## 2024-06-08 NOTE — Progress Notes (Deleted)
 Office Visit Note  Patient: Sherry Jacobs             Date of Birth: 1966/07/01           MRN: [id number]             PCP: Antonio Cyndee Jamee JONELLE, DO Referring: Antonio Cyndee Jamee JONELLE, * Visit Date: 06/22/2024 Occupation: @GUAROCC @  Subjective:    History of Present Illness: Sherry Jacobs is a 58 y.o. female with history of autoimmune disease.  Patient has been off of plaquenil  since may 2023.  Lab work from 12/18/23 was reviewed today in the office: ANA remains positive, dsDNA negative, complements WNL, and ESR WNL.  Plan to update the following lab work today.  Activities of Daily Living:  Patient reports morning stiffness for *** {minute/hour:19697}.   Patient {ACTIONS;DENIES/REPORTS:21021675::Denies} nocturnal pain.  Difficulty dressing/grooming: {ACTIONS;DENIES/REPORTS:21021675::Denies} Difficulty climbing stairs: {ACTIONS;DENIES/REPORTS:21021675::Denies} Difficulty getting out of chair: {ACTIONS;DENIES/REPORTS:21021675::Denies} Difficulty using hands for taps, buttons, cutlery, and/or writing: {ACTIONS;DENIES/REPORTS:21021675::Denies}  No Rheumatology ROS completed.   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   LEG PAIN, LEFT 10/28/2008   Anxiety state 06/23/2008   HYPERLIPIDEMIA 10/02/2007   Palpitations 09/09/2007   ANA POSITIVE, HX OF 09/09/2007   IRRITABLE BOWEL SYNDROME 04/11/2007    Past Medical History:  Diagnosis Date   ANA positive    Arthralgia    Autoimmune disease (HCC)    ANA positive    Endometrial polyp    Hyperlipidemia    IBS (irritable bowel syndrome)    Iron deficiency anemia    Lactose intolerance in adult    PONV (postoperative nausea and vomiting)    Social anxiety disorder    Wears contact lenses     Family History  Problem Relation Age of Onset   Breast cancer Mother    Colon polyps Father    Melanoma Maternal Grandfather    Breast cancer Paternal Grandmother    Asthma Daughter        exercise induced    Asthma Son        exercise induced    Diabetes Other    Hypertension Other    Alcohol abuse Other    Hyperlipidemia Other    Arthritis Other    Stroke Other    Thyroid  disease Other    Uterine cancer Other    Colon cancer Neg Hx    Esophageal cancer Neg Hx    Stomach cancer Neg Hx    Rectal cancer Neg Hx    Past Surgical History:  Procedure Laterality Date   APPENDECTOMY  1981   DILATATION & CURETTAGE/HYSTEROSCOPY WITH MYOSURE N/A 10/28/2014   Procedure: DILATATION & CURETTAGE/HYSTEROSCOPY WITH MYOSURE;  Surgeon: Truman Corona, MD;  Location: [facility] [place];  Service: Gynecology;  Laterality: N/A;   RIGHT MODIFIED NECK DISSECTION W/ REMOVAL BENIGN RIGHT PARAPHARYNGEAL SPACE MASS AND LYMPH NODES  08-02-2004   benign bronchogenic cysts / negative lymph nodes   Social History   Social History Narrative   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   Living at home with husband and two kids                        Immunization History  Administered Date(s) Administered   H1N1 10/13/2008   Influenza Whole 11/26/2004, 09/15/2008, 08/30/2010   Influenza, Seasonal, Injecte, Preservative Fre 09/03/2023   Influenza-Unspecified 08/27/2011, 08/26/2016, 09/05/2019, 09/04/2020   Moderna Covid-19 Vaccine Bivalent Booster 98yrs & up 11/22/2021   PFIZER(Purple Top)SARS-COV-2 Vaccination 11/17/2019, 12/08/2019   Pfizer(Comirnaty )Fall Seasonal Vaccine 12 years and older 12/14/2022, 01/22/2024   Pneumococcal Polysaccharide-23 05/11/2011    Td 12/15/2008   Tdap 08/06/2022   Unspecified SARS-COV-2 Vaccination 11/02/2020   Varicella 08/07/2021   Zoster Recombinant(Shingrix) 06/06/2021, 08/08/2021   Zoster, Live 02/24/2022     Objective: Vital Signs: LMP 04/07/2017    Physical Exam Vitals and nursing note reviewed.  Constitutional:      Appearance: She is well-developed.  HENT:     Head: Normocephalic and atraumatic.  Eyes:     Conjunctiva/sclera: Conjunctivae normal.  Cardiovascular:     Rate and Rhythm: Normal rate and regular rhythm.     Heart sounds: Normal heart sounds.  Pulmonary:     Effort: Pulmonary effort is normal.     Breath sounds: Normal breath sounds.  Abdominal:     General: Bowel sounds are normal.     Palpations: Abdomen is soft.  Musculoskeletal:     Cervical back: Normal range of motion.  Lymphadenopathy:     Cervical: No cervical adenopathy.  Skin:    General: Skin is warm and dry.     Capillary Refill: Capillary refill takes less than 2 seconds.  Neurological:     Mental Status: She is alert and oriented to person, place, and time.  Psychiatric:        Behavior: Behavior normal.      Musculoskeletal Exam: ***  CDAI Exam: CDAI Score: -- Patient Global: --; Provider Global: -- Swollen: --; Tender: -- Joint Exam 06/22/2024   No joint exam has been documented for this visit   There is currently no information documented on the homunculus. Go to the Rheumatology activity and complete the homunculus joint exam.  Investigation: No additional findings.  Imaging: No results found.  Recent Labs: Lab Results  Component Value Date   WBC 5.1 02/18/2024   HGB 14.0 02/18/2024   PLT 264.0 02/18/2024   NA 139 02/18/2024   K 4.1 02/18/2024   CL 101 02/18/2024   CO2 29 02/18/2024   GLUCOSE 94 02/18/2024   BUN 16 02/18/2024   CREATININE 0.91 02/18/2024   BILITOT 0.6 02/18/2024   ALKPHOS 62 02/18/2024   AST 16 02/18/2024   ALT 19 02/18/2024   PROT 7.2 02/18/2024   ALBUMIN 4.7  02/18/2024   CALCIUM  10.1 02/18/2024   GFRAA 88 01/10/2021    Speciality Comments: PLQ Eye Exam: 04/29/2023 WNL @ [facility].  Procedures:  No procedures performed Allergies: Sulfa antibiotics   Assessment / Plan:     Visit Diagnoses: Autoimmune disease (HCC)  High risk medication use  Raynaud's disease without gangrene  Sicca syndrome (HCC)  Fibromyalgia  Other fatigue  Primary osteoarthritis of both hands  Trochanteric bursitis of both hips  Vitamin D  deficiency  History of anxiety  History of hyperlipidemia  History of IBS  Orders: No orders of the defined types were placed in this encounter.  No orders of the defined types were placed in this encounter.  Face-to-face time spent with patient was *** minutes. Greater than 50% of time was spent in counseling and coordination of care.  Follow-Up Instructions: No follow-ups on file.   Waddell CHRISTELLA Craze, PA-C  Note - This record has been created using Dragon software.  Chart creation errors have been sought, but may not always  have been located. Such creation errors do not reflect on  the standard of medical care.

## 2024-06-16 ENCOUNTER — Ambulatory Visit: Payer: Commercial Managed Care - PPO | Admitting: Rheumatology

## 2024-06-22 ENCOUNTER — Ambulatory Visit: Admitting: Physician Assistant

## 2024-06-22 DIAGNOSIS — M19041 Primary osteoarthritis, right hand: Secondary | ICD-10-CM

## 2024-06-22 DIAGNOSIS — Z8639 Personal history of other endocrine, nutritional and metabolic disease: Secondary | ICD-10-CM

## 2024-06-22 DIAGNOSIS — I7300 Raynaud's syndrome without gangrene: Secondary | ICD-10-CM

## 2024-06-22 DIAGNOSIS — E559 Vitamin D deficiency, unspecified: Secondary | ICD-10-CM

## 2024-06-22 DIAGNOSIS — Z8659 Personal history of other mental and behavioral disorders: Secondary | ICD-10-CM

## 2024-06-22 DIAGNOSIS — M797 Fibromyalgia: Secondary | ICD-10-CM

## 2024-06-22 DIAGNOSIS — M359 Systemic involvement of connective tissue, unspecified: Secondary | ICD-10-CM

## 2024-06-22 DIAGNOSIS — R5383 Other fatigue: Secondary | ICD-10-CM

## 2024-06-22 DIAGNOSIS — M7061 Trochanteric bursitis, right hip: Secondary | ICD-10-CM

## 2024-06-22 DIAGNOSIS — M3500 Sicca syndrome, unspecified: Secondary | ICD-10-CM

## 2024-06-22 DIAGNOSIS — Z79899 Other long term (current) drug therapy: Secondary | ICD-10-CM

## 2024-06-22 DIAGNOSIS — Z8719 Personal history of other diseases of the digestive system: Secondary | ICD-10-CM

## 2024-07-01 DIAGNOSIS — F4320 Adjustment disorder, unspecified: Secondary | ICD-10-CM | POA: Diagnosis not present

## 2024-07-08 NOTE — Progress Notes (Signed)
 Office Visit Note  Patient: Sherry Jacobs             Date of Birth: 08/19/1966           MRN: [id number]             PCP: Antonio Cyndee Jamee JONELLE, DO Referring: Antonio Cyndee Jamee JONELLE, * Visit Date: 07/22/2024 Occupation: @GUAROCC @  Subjective:  Medication monitoring   History of Present Illness: CHARLETTA VOIGHT is a 58 y.o. female with history of autoimmune disease.  Patient has been off of plaquenil since may 2023.  She denies any symptoms of a flare.  She has not had any increased fatigue.  She has been working with a Systems analyst as well as going to Pilates 3 days a week for exercise.  She denies any joint pain or joint swelling at this time.  She denies any recent rashes, hair loss, oral or nasal ulcerations, or symptoms of Raynaud's phenomenon.  She denies any new medical conditions.    Activities of Daily Living:  Patient reports morning stiffness for 0 minute.   Patient Denies nocturnal pain.  Difficulty dressing/grooming: Denies Difficulty climbing stairs: Denies Difficulty getting out of chair: Denies Difficulty using hands for taps, buttons, cutlery, and/or writing: Denies  Review of Systems  Constitutional:  Negative for fatigue.  HENT:  Negative for mouth sores and mouth dryness.   Eyes:  Negative for dryness.  Respiratory:  Negative for shortness of breath.   Cardiovascular:  Negative for chest pain and palpitations.  Gastrointestinal:  Negative for blood in stool, constipation and diarrhea.  Endocrine: Negative for increased urination.  Genitourinary:  Negative for involuntary urination.  Musculoskeletal:  Negative for joint pain, gait problem, joint pain, joint swelling, myalgias, muscle weakness, morning stiffness, muscle tenderness and myalgias.  Skin:  Negative for color change, rash, hair loss and sensitivity to sunlight.  Allergic/Immunologic: Negative for susceptible to infections.  Neurological:  Negative for dizziness and headaches.   Hematological:  Negative for swollen glands.  Psychiatric/Behavioral:  Positive for sleep disturbance. Negative for depressed mood. The patient is not nervous/anxious.     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   LEG PAIN, LEFT 10/28/2008   Anxiety state 06/23/2008   HYPERLIPIDEMIA 10/02/2007   Palpitations 09/09/2007   ANA POSITIVE, HX OF 09/09/2007   IRRITABLE BOWEL SYNDROME 04/11/2007    Past Medical History:  Diagnosis Date   ANA positive    Arthralgia    Autoimmune disease (HCC)    ANA positive   Endometrial polyp    Hyperlipidemia    IBS (irritable bowel syndrome)    Iron deficiency anemia    Lactose intolerance in adult    PONV (postoperative nausea and vomiting)    Social anxiety disorder    Wears contact lenses     Family History  Problem Relation Age of Onset   Breast cancer Mother    Colon polyps Father    Melanoma Maternal Grandfather    Breast cancer Paternal Grandmother  Asthma Daughter        exercise induced    Asthma Son        exercise induced    Diabetes Other    Hypertension Other    Alcohol abuse Other    Hyperlipidemia Other    Arthritis Other    Stroke Other    Thyroid disease Other    Uterine cancer Other    Colon cancer Neg Hx    Esophageal cancer Neg Hx    Stomach cancer Neg Hx    Rectal cancer Neg Hx    Past Surgical History:  Procedure Laterality Date   APPENDECTOMY  1981   DILATATION & CURETTAGE/HYSTEROSCOPY WITH MYOSURE N/A 10/28/2014   Procedure: DILATATION &  CURETTAGE/HYSTEROSCOPY WITH MYOSURE;  Surgeon: Truman Corona, MD;  Location: [facility];  Service: Gynecology;  Laterality: N/A;   RIGHT MODIFIED NECK DISSECTION W/ REMOVAL BENIGN RIGHT PARAPHARYNGEAL SPACE MASS AND LYMPH NODES  08-02-2004   benign bronchogenic cysts / negative lymph nodes   Social History   Social History Narrative   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   Living at home with husband and two kids                        Immunization History  Administered Date(s) Administered   H1N1 10/13/2008   Influenza Whole 11/26/2004, 09/15/2008, 08/30/2010   Influenza, Seasonal, Injecte, Preservative Fre 09/03/2023   Influenza-Unspecified 08/27/2011, 08/26/2016, 09/05/2019, 09/04/2020   Moderna Covid-19 Vaccine Bivalent Booster 61yrs & up 11/22/2021   PFIZER(Purple Top)SARS-COV-2 Vaccination 11/17/2019, 12/08/2019   Pfizer(Comirnaty)Fall Seasonal Vaccine 12 years and older 12/14/2022, 01/22/2024   Pneumococcal Polysaccharide-23 05/11/2011   Td 12/15/2008   Tdap 08/06/2022   Unspecified SARS-COV-2 Vaccination 11/02/2020   Varicella 08/07/2021   Zoster Recombinant(Shingrix) 06/06/2021, 08/08/2021   Zoster, Live 02/24/2022     Objective: Vital Signs: BP 112/77 (BP Location: Left Arm, Patient Position: Sitting, Cuff Size: Normal)   Pulse 65   Resp 12   Ht 5' 7 (1.702 m)   Wt 180 lb 6.4 oz (81.8 kg)   LMP 04/07/2017   BMI 28.25 kg/m    Physical Exam Vitals and nursing note reviewed.  Constitutional:      Appearance: She is well-developed.  HENT:     Head: Normocephalic and atraumatic.  Eyes:     Conjunctiva/sclera: Conjunctivae normal.  Cardiovascular:     Rate and Rhythm: Normal rate and regular rhythm.     Heart sounds: Normal heart sounds.  Pulmonary:     Effort: Pulmonary effort is normal.     Breath sounds: Normal breath sounds.  Abdominal:     General: Bowel sounds are normal.     Palpations: Abdomen is soft.  Musculoskeletal:      Cervical back: Normal range of motion.  Lymphadenopathy:     Cervical: No cervical adenopathy.  Skin:    General: Skin is warm and dry.     Capillary Refill: Capillary refill takes less than 2 seconds.  Neurological:     Mental Status: She is alert and oriented to person, place, and time.  Psychiatric:        Behavior: Behavior normal.      Musculoskeletal Exam: C-spine, thoracic spine, lumbar spine have good range of motion.  Shoulder joints, elbow joints, wrist joints, MCPs, PIPs, DIPs have good range of motion with no synovitis.  Complete fist formation bilaterally.  Hip joints have good range of motion with no  groin pain.  Knee joints have good range of motion no warmth or effusion.  Ankle joints have good range of motion with no tenderness or joint swelling.  Mild tenderness over the trochanteric bursa bilaterally.  CDAI Exam: CDAI Score: -- Patient Global: --; Provider Global: -- Swollen: --; Tender: -- Joint Exam 07/22/2024   No joint exam has been documented for this visit   There is currently no information documented on the homunculus. Go to the Rheumatology activity and complete the homunculus joint exam.  Investigation: No additional findings.  Imaging: No results found.  Recent Labs: Lab Results  Component Value Date   WBC 5.1 02/18/2024   HGB 14.0 02/18/2024   PLT 264.0 02/18/2024   NA 139 02/18/2024   K 4.1 02/18/2024   CL 101 02/18/2024   CO2 29 02/18/2024   GLUCOSE 94 02/18/2024   BUN 16 02/18/2024   CREATININE 0.91 02/18/2024   BILITOT 0.6 02/18/2024   ALKPHOS 62 02/18/2024   AST 16 02/18/2024   ALT 19 02/18/2024   PROT 7.2 02/18/2024   ALBUMIN 4.7 02/18/2024   CALCIUM 10.1 02/18/2024   GFRAA 88 01/10/2021    Speciality Comments: PLQ Eye Exam: 04/29/2023 WNL @ [employer] [facility].  Procedures:  No procedures performed Allergies: Sulfa antibiotics   Assessment / Plan:     Visit Diagnoses: Autoimmune disease (HCC) - +ANA, low C3, arthralgia,  fatigue, Sicca symptoms, and Raynaud's: She has not had any signs or symptoms of a flare.  She is clinically been doing well despite discontinuing Plaquenil in May 2023.  She has no synovitis on examination today.  No signs of alopecia.  No malar rash noted.  No oral or nasal ulcerations.  No symptoms of Raynaud's phenomenon.  No sicca symptoms.  Her energy level has been stable.  She remains active exercising several days per week. Lab work from 12/18/23 was reviewed today in the office: ANA remains positive, dsDNA negative, complements WNL, and ESR WNL.  Plan to update the following lab work tomorrow-orders provided to the patient.  She will notify us  if she develops any new or worsening symptoms.  She will follow-up in the office in 6 months or sooner if needed.  - Plan: Protein / creatinine ratio, urine, CBC with Differential/Platelet, ANA, Anti-DNA antibody, double-stranded, C3 and C4, Sedimentation rate, Comprehensive metabolic panel with GFR  High risk medication use - Discontinued hydroxychloroquine in May 2023.  (dsDNA-, complements WNL, and ESR WNL). PLQ Eye Exam: 10/27/2021.  - Plan: CBC with Differential/Platelet, Comprehensive metabolic panel with GFR  Raynaud's disease without gangrene: Not currently symptomatic.  No signs of sclerodactyly.  No digital ulcerations noted.  Sicca syndrome [facility]): She is not experiencing any sicca symptoms at this time.  Primary osteoarthritis of both hands: She has no synovitis on exam.  Complete fist formation bilaterally.  Fibromyalgia: She is not exhibiting any features of a fibromyalgia flare.  She has not been experiencing any fatigue or myofascial pain.  She remains active working with a Systems analyst as well as going to Pilates 3 days a week for exercise.  Trochanteric bursitis of both hips -Intermittent discomfort.  Mild tenderness noted on examination today.  She has performed stretching exercises to help alleviate this discomfort.   Other  fatigue: Her energy level has been stable.  She has been working with a Systems analyst and going to Pilates 3 days a week.  Other medical conditions are listed as follows:  Vitamin D deficiency  History of anxiety  History of hyperlipidemia -Patient would like to have lipid panel checked tomorrow while she is fasting.  Order released.  Plan: Lipid panel  History of IBS  Orders: Orders Placed This Encounter  Procedures   Protein / creatinine ratio, urine   CBC with Differential/Platelet   ANA   Anti-DNA antibody, double-stranded   C3 and C4   Sedimentation rate   Comprehensive metabolic panel with GFR   Lipid panel   No orders of the defined types were placed in this encounter.    Follow-Up Instructions: No follow-ups on file.   Waddell CHRISTELLA Craze, PA-C  Note - This record has been created using Dragon software.  Chart creation errors have been sought, but may not always  have been located. Such creation errors do not reflect on  the standard of medical care.

## 2024-07-21 DIAGNOSIS — F4320 Adjustment disorder, unspecified: Secondary | ICD-10-CM | POA: Diagnosis not present

## 2024-07-22 ENCOUNTER — Encounter: Payer: Self-pay | Admitting: Physician Assistant

## 2024-07-22 ENCOUNTER — Ambulatory Visit: Attending: Physician Assistant | Admitting: Physician Assistant

## 2024-07-22 VITALS — BP 112/77 | HR 65 | Resp 12 | Ht 67.0 in | Wt 180.4 lb

## 2024-07-22 DIAGNOSIS — M359 Systemic involvement of connective tissue, unspecified: Secondary | ICD-10-CM | POA: Diagnosis not present

## 2024-07-22 DIAGNOSIS — M7062 Trochanteric bursitis, left hip: Secondary | ICD-10-CM

## 2024-07-22 DIAGNOSIS — R5383 Other fatigue: Secondary | ICD-10-CM

## 2024-07-22 DIAGNOSIS — Z8639 Personal history of other endocrine, nutritional and metabolic disease: Secondary | ICD-10-CM

## 2024-07-22 DIAGNOSIS — E559 Vitamin D deficiency, unspecified: Secondary | ICD-10-CM | POA: Diagnosis not present

## 2024-07-22 DIAGNOSIS — Z8659 Personal history of other mental and behavioral disorders: Secondary | ICD-10-CM

## 2024-07-22 DIAGNOSIS — M797 Fibromyalgia: Secondary | ICD-10-CM | POA: Diagnosis not present

## 2024-07-22 DIAGNOSIS — M19041 Primary osteoarthritis, right hand: Secondary | ICD-10-CM | POA: Diagnosis not present

## 2024-07-22 DIAGNOSIS — I7300 Raynaud's syndrome without gangrene: Secondary | ICD-10-CM

## 2024-07-22 DIAGNOSIS — Z79899 Other long term (current) drug therapy: Secondary | ICD-10-CM

## 2024-07-22 DIAGNOSIS — M7061 Trochanteric bursitis, right hip: Secondary | ICD-10-CM | POA: Diagnosis not present

## 2024-07-22 DIAGNOSIS — M3500 Sicca syndrome, unspecified: Secondary | ICD-10-CM | POA: Diagnosis not present

## 2024-07-22 DIAGNOSIS — Z8719 Personal history of other diseases of the digestive system: Secondary | ICD-10-CM

## 2024-07-22 DIAGNOSIS — M19042 Primary osteoarthritis, left hand: Secondary | ICD-10-CM

## 2024-07-29 ENCOUNTER — Other Ambulatory Visit (HOSPITAL_BASED_OUTPATIENT_CLINIC_OR_DEPARTMENT_OTHER): Payer: Self-pay

## 2024-07-29 DIAGNOSIS — F411 Generalized anxiety disorder: Secondary | ICD-10-CM | POA: Diagnosis not present

## 2024-07-29 MED ORDER — PROPRANOLOL HCL 20 MG PO TABS
20.0000 mg | ORAL_TABLET | Freq: Three times a day (TID) | ORAL | 5 refills | Status: AC
  Filled 2024-07-29: qty 90, 30d supply, fill #0
  Filled 2024-07-30: qty 20, 7d supply, fill #0
  Filled 2024-11-30: qty 20, 7d supply, fill #1

## 2024-07-29 MED ORDER — ALPRAZOLAM 0.25 MG PO TABS
0.2500 mg | ORAL_TABLET | Freq: Two times a day (BID) | ORAL | 2 refills | Status: AC
  Filled 2024-07-29: qty 60, 30d supply, fill #0
  Filled 2024-07-30: qty 20, 10d supply, fill #0
  Filled 2024-11-30: qty 20, 10d supply, fill #1

## 2024-07-30 ENCOUNTER — Other Ambulatory Visit (HOSPITAL_BASED_OUTPATIENT_CLINIC_OR_DEPARTMENT_OTHER): Payer: Self-pay

## 2024-08-19 DIAGNOSIS — F4320 Adjustment disorder, unspecified: Secondary | ICD-10-CM | POA: Diagnosis not present

## 2024-08-27 ENCOUNTER — Ambulatory Visit: Payer: Self-pay | Admitting: Physician Assistant

## 2024-08-27 NOTE — Progress Notes (Signed)
 No proteinuria  CBC and CMP WNL Lipid panel WNL

## 2024-08-28 LAB — COMPREHENSIVE METABOLIC PANEL WITH GFR
AG Ratio: 2.1 (calc) (ref 1.0–2.5)
ALT: 16 U/L (ref 6–29)
AST: 16 U/L (ref 10–35)
Albumin: 4.6 g/dL (ref 3.6–5.1)
Alkaline phosphatase (APISO): 46 U/L (ref 37–153)
BUN: 20 mg/dL (ref 7–25)
CO2: 30 mmol/L (ref 20–32)
Calcium: 9.6 mg/dL (ref 8.6–10.4)
Chloride: 104 mmol/L (ref 98–110)
Creat: 0.94 mg/dL (ref 0.50–1.03)
Globulin: 2.2 g/dL (ref 1.9–3.7)
Glucose, Bld: 82 mg/dL (ref 65–99)
Potassium: 4.4 mmol/L (ref 3.5–5.3)
Sodium: 141 mmol/L (ref 135–146)
Total Bilirubin: 0.4 mg/dL (ref 0.2–1.2)
Total Protein: 6.8 g/dL (ref 6.1–8.1)
eGFR: 70 mL/min/1.73m2 (ref >=60)

## 2024-08-28 LAB — SEDIMENTATION RATE: Sed Rate: 9 mm/h (ref 0–30)

## 2024-08-28 LAB — LIPID PANEL
Cholesterol: 132 mg/dL (ref <200)
HDL: 58 mg/dL (ref >=50)
LDL Cholesterol (Calc): 61 mg/dL
Non-HDL Cholesterol (Calc): 74 mg/dL (ref <130)
Total CHOL/HDL Ratio: 2.3 (calc) (ref <5.0)
Triglycerides: 52 mg/dL (ref <150)

## 2024-08-28 LAB — CBC WITH DIFFERENTIAL/PLATELET
Absolute Lymphocytes: 1485 {cells}/uL (ref 850–3900)
Absolute Monocytes: 418 {cells}/uL (ref 200–950)
Basophils Absolute: 38 {cells}/uL (ref 0–200)
Basophils Relative: 0.8 %
Eosinophils Absolute: 89 {cells}/uL (ref 15–500)
Eosinophils Relative: 1.9 %
HCT: 42.3 % (ref 35.0–45.0)
Hemoglobin: 13.7 g/dL (ref 11.7–15.5)
MCH: 28.5 pg (ref 27.0–33.0)
MCHC: 32.4 g/dL (ref 32.0–36.0)
MCV: 87.9 fL (ref 80.0–100.0)
MPV: 11.9 fL (ref 7.5–12.5)
Monocytes Relative: 8.9 %
Neutro Abs: 2670 {cells}/uL (ref 1500–7800)
Neutrophils Relative %: 56.8 %
Platelets: 247 Thousand/uL (ref 140–400)
RBC: 4.81 Million/uL (ref 3.80–5.10)
RDW: 12.8 % (ref 11.0–15.0)
Total Lymphocyte: 31.6 %
WBC: 4.7 Thousand/uL (ref 3.8–10.8)

## 2024-08-28 LAB — ANTI-NUCLEAR AB-TITER (ANA TITER)
ANA TITER: 1:320 {titer} — ABNORMAL HIGH
ANA Titer 1: 1:320 {titer} — ABNORMAL HIGH

## 2024-08-28 LAB — PROTEIN / CREATININE RATIO, URINE
Creatinine, Urine: 38 mg/dL (ref 20–275)
Total Protein, Urine: <4 mg/dL — ABNORMAL LOW (ref 5–24)

## 2024-08-28 LAB — ANA: Anti Nuclear Antibody (ANA): POSITIVE — AB

## 2024-08-28 LAB — C3 AND C4
C3 Complement: 128 mg/dL (ref 83–193)
C4 Complement: 31 mg/dL (ref 15–57)

## 2024-08-28 LAB — ANTI-DNA ANTIBODY, DOUBLE-STRANDED: ds DNA Ab: <1 [IU]/mL

## 2024-08-28 NOTE — Progress Notes (Signed)
 Complements WNL dsDNA is negative.  ANA pending

## 2024-08-31 NOTE — Progress Notes (Signed)
 ANA remains positive.

## 2024-09-14 ENCOUNTER — Telehealth: Admitting: Physician Assistant

## 2024-09-14 ENCOUNTER — Other Ambulatory Visit (HOSPITAL_BASED_OUTPATIENT_CLINIC_OR_DEPARTMENT_OTHER): Payer: Self-pay

## 2024-09-14 DIAGNOSIS — B9689 Other specified bacterial agents as the cause of diseases classified elsewhere: Secondary | ICD-10-CM | POA: Diagnosis not present

## 2024-09-14 DIAGNOSIS — J019 Acute sinusitis, unspecified: Secondary | ICD-10-CM

## 2024-09-14 MED ORDER — AMOXICILLIN-POT CLAVULANATE 875-125 MG PO TABS
1.0000 | ORAL_TABLET | Freq: Two times a day (BID) | ORAL | 0 refills | Status: AC
  Filled 2024-09-14: qty 14, 7d supply, fill #0

## 2024-09-14 NOTE — Progress Notes (Signed)
 E-Visit for Sinus Problems  We are sorry that you are not feeling well.  Here is how we plan to help!  Based on what you have shared with me it looks like you have sinusitis.  Sinusitis is inflammation and infection in the sinus cavities of the head.  Based on your presentation I believe you most likely have Acute Bacterial Sinusitis.  This is an infection caused by bacteria and is treated with antibiotics. I have prescribed Augmentin  875mg /125mg  one tablet twice daily with food, for 7 days.  You may use an oral decongestant such as Mucinex Max strength ( BLUE AND WHITE BOX ) .  Saline nasal spray help and can safely be used as often as needed for congestion.   If you develop worsening sinus pain, fever or notice severe headache and vision changes, or if symptoms are not better after completion of antibiotic, please schedule an appointment with a health care provider.    Sinus infections are not as easily transmitted as other respiratory infection, however we still recommend that you avoid close contact with loved ones, especially the very young and elderly.  Remember to wash your hands thoroughly throughout the day as this is the number one way to prevent the spread of infection!  Home Care: Only take medications as instructed by your medical team. Complete the entire course of an antibiotic. Do not take these medications with alcohol. A steam or ultrasonic humidifier can help congestion.  You can place a towel over your head and breathe in the steam from hot water coming from a faucet. Avoid close contacts especially the very young and the elderly. Cover your mouth when you cough or sneeze. Always remember to wash your hands.  Get Help Right Away If: You develop worsening fever or sinus pain. You develop a severe head ache or visual changes. Your symptoms persist after you have completed your treatment plan.  Make sure you Understand these instructions. Will watch your  condition. Will get help right away if you are not doing well or get worse.  Your e-visit answers were reviewed by a board certified advanced clinical practitioner to complete your personal care plan.  Depending on the condition, your plan could have included both over the counter or prescription medications.  If there is a problem please reply  once you have received a response from your provider.  Your safety is important to us .  If you have drug allergies check your prescription carefully.    You can use MyChart to ask questions about today's visit, request a non-urgent call back, or ask for a work or school excuse for 24 hours related to this e-Visit. If it has been greater than 24 hours you will need to follow up with your provider, or enter a new e-Visit to address those concerns.  You will get an e-mail in the next two days asking about your experience.  I hope that your e-visit has been valuable and will speed your recovery. Thank you for using e-visits.  I have spent 5 minutes in review of e-visit questionnaire, review and updating patient chart, medical decision making and response to patient.   Kaliana Albino, PA-C

## 2024-10-06 DIAGNOSIS — F4320 Adjustment disorder, unspecified: Secondary | ICD-10-CM | POA: Diagnosis not present

## 2024-11-02 ENCOUNTER — Other Ambulatory Visit (HOSPITAL_COMMUNITY): Payer: Self-pay | Admitting: Obstetrics and Gynecology

## 2024-11-02 DIAGNOSIS — Z803 Family history of malignant neoplasm of breast: Secondary | ICD-10-CM

## 2024-11-11 DIAGNOSIS — F4320 Adjustment disorder, unspecified: Secondary | ICD-10-CM | POA: Diagnosis not present

## 2024-11-23 ENCOUNTER — Ambulatory Visit (HOSPITAL_COMMUNITY)

## 2024-11-30 ENCOUNTER — Ambulatory Visit (HOSPITAL_COMMUNITY)
Admission: RE | Admit: 2024-11-30 | Discharge: 2024-11-30 | Disposition: A | Discharge status: Home / Self Care | Source: Ambulatory Visit | Attending: Obstetrics and Gynecology | Admitting: Obstetrics and Gynecology

## 2024-11-30 ENCOUNTER — Other Ambulatory Visit: Payer: Self-pay

## 2024-11-30 ENCOUNTER — Encounter: Payer: Self-pay | Admitting: Family Medicine

## 2024-11-30 ENCOUNTER — Other Ambulatory Visit (HOSPITAL_BASED_OUTPATIENT_CLINIC_OR_DEPARTMENT_OTHER): Payer: Self-pay

## 2024-11-30 ENCOUNTER — Other Ambulatory Visit: Payer: Self-pay | Admitting: Family Medicine

## 2024-11-30 DIAGNOSIS — Z1239 Encounter for other screening for malignant neoplasm of breast: Secondary | ICD-10-CM | POA: Insufficient documentation

## 2024-11-30 DIAGNOSIS — Z803 Family history of malignant neoplasm of breast: Secondary | ICD-10-CM | POA: Insufficient documentation

## 2024-11-30 MED ORDER — GADOBUTROL 1 MMOL/ML IV SOLN
8.0000 mL | Freq: Once | INTRAVENOUS | Status: AC | PRN
  Administered 2024-11-30: 8 mL via INTRAVENOUS

## 2024-11-30 MED ORDER — ROSUVASTATIN CALCIUM 10 MG PO TABS
10.0000 mg | ORAL_TABLET | Freq: Every day | ORAL | 0 refills | Status: AC
  Filled 2024-11-30: qty 90, 90d supply, fill #0

## 2024-12-01 ENCOUNTER — Other Ambulatory Visit (HOSPITAL_BASED_OUTPATIENT_CLINIC_OR_DEPARTMENT_OTHER): Payer: Self-pay

## 2024-12-01 ENCOUNTER — Other Ambulatory Visit: Payer: Self-pay

## 2024-12-01 ENCOUNTER — Other Ambulatory Visit: Payer: Self-pay | Admitting: Family Medicine

## 2024-12-01 DIAGNOSIS — T753XXA Motion sickness, initial encounter: Secondary | ICD-10-CM

## 2024-12-01 MED ORDER — OSELTAMIVIR PHOSPHATE 75 MG PO CAPS
75.0000 mg | ORAL_CAPSULE | Freq: Two times a day (BID) | ORAL | 0 refills | Status: AC
  Filled 2024-12-01: qty 10, 5d supply, fill #0

## 2024-12-01 MED ORDER — SCOPOLAMINE 1 MG/3DAYS TD PT72
1.0000 | MEDICATED_PATCH | TRANSDERMAL | 12 refills | Status: AC
  Filled 2024-12-01: qty 10, 30d supply, fill #0

## 2025-01-26 ENCOUNTER — Ambulatory Visit: Admitting: Rheumatology

## 2025-02-09 ENCOUNTER — Encounter: Admitting: Family Medicine
